# Patient Record
Sex: Female | Born: 1975 | Race: Black or African American | Hispanic: No | Marital: Single | Smoking: Never smoker
Health system: Southern US, Community
[De-identification: ages and names within clinical notes are randomized; demographics above are authoritative.]

## PROBLEM LIST (undated history)

## (undated) DIAGNOSIS — M549 Dorsalgia, unspecified: Secondary | ICD-10-CM

## (undated) DIAGNOSIS — R079 Chest pain, unspecified: Secondary | ICD-10-CM

## (undated) DIAGNOSIS — T7840XA Allergy, unspecified, initial encounter: Secondary | ICD-10-CM

## (undated) DIAGNOSIS — M25471 Effusion, right ankle: Secondary | ICD-10-CM

## (undated) DIAGNOSIS — G4733 Obstructive sleep apnea (adult) (pediatric): Secondary | ICD-10-CM

## (undated) DIAGNOSIS — N920 Excessive and frequent menstruation with regular cycle: Secondary | ICD-10-CM

## (undated) DIAGNOSIS — R600 Localized edema: Secondary | ICD-10-CM

## (undated) DIAGNOSIS — K219 Gastro-esophageal reflux disease without esophagitis: Secondary | ICD-10-CM

## (undated) DIAGNOSIS — M25472 Effusion, left ankle: Secondary | ICD-10-CM

## (undated) DIAGNOSIS — R002 Palpitations: Secondary | ICD-10-CM

## (undated) DIAGNOSIS — E669 Obesity, unspecified: Secondary | ICD-10-CM

## (undated) DIAGNOSIS — F419 Anxiety disorder, unspecified: Secondary | ICD-10-CM

## (undated) DIAGNOSIS — L918 Other hypertrophic disorders of the skin: Secondary | ICD-10-CM

## (undated) DIAGNOSIS — M7989 Other specified soft tissue disorders: Secondary | ICD-10-CM

## (undated) DIAGNOSIS — I10 Essential (primary) hypertension: Secondary | ICD-10-CM

## (undated) DIAGNOSIS — R12 Heartburn: Secondary | ICD-10-CM

## (undated) DIAGNOSIS — B019 Varicella without complication: Secondary | ICD-10-CM

## (undated) DIAGNOSIS — J329 Chronic sinusitis, unspecified: Principal | ICD-10-CM

## (undated) DIAGNOSIS — Z124 Encounter for screening for malignant neoplasm of cervix: Principal | ICD-10-CM

## (undated) DIAGNOSIS — N3281 Overactive bladder: Secondary | ICD-10-CM

## (undated) DIAGNOSIS — N83209 Unspecified ovarian cyst, unspecified side: Secondary | ICD-10-CM

## (undated) DIAGNOSIS — E785 Hyperlipidemia, unspecified: Secondary | ICD-10-CM

## (undated) DIAGNOSIS — R87629 Unspecified abnormal cytological findings in specimens from vagina: Secondary | ICD-10-CM

## (undated) HISTORY — DX: Essential (primary) hypertension: I10

## (undated) HISTORY — DX: Effusion, right ankle: M25.471

## (undated) HISTORY — PX: OTHER SURGICAL HISTORY: SHX169

## (undated) HISTORY — DX: Hyperlipidemia, unspecified: E78.5

## (undated) HISTORY — DX: Chest pain, unspecified: R07.9

## (undated) HISTORY — DX: Obesity, unspecified: E66.9

## (undated) HISTORY — DX: Obstructive sleep apnea (adult) (pediatric): G47.33

## (undated) HISTORY — DX: Anxiety disorder, unspecified: F41.9

## (undated) HISTORY — DX: Localized edema: R60.0

## (undated) HISTORY — DX: Overactive bladder: N32.81

## (undated) HISTORY — DX: Allergy, unspecified, initial encounter: T78.40XA

## (undated) HISTORY — DX: Other hypertrophic disorders of the skin: L91.8

## (undated) HISTORY — DX: Other specified soft tissue disorders: M79.89

## (undated) HISTORY — DX: Encounter for screening for malignant neoplasm of cervix: Z12.4

## (undated) HISTORY — DX: Gastro-esophageal reflux disease without esophagitis: K21.9

## (undated) HISTORY — DX: Varicella without complication: B01.9

## (undated) HISTORY — DX: Excessive and frequent menstruation with regular cycle: N92.0

## (undated) HISTORY — DX: Effusion, left ankle: M25.472

## (undated) HISTORY — DX: Palpitations: R00.2

## (undated) HISTORY — DX: Heartburn: R12

## (undated) HISTORY — PX: HERNIA REPAIR: SHX51

## (undated) HISTORY — DX: Unspecified abnormal cytological findings in specimens from vagina: R87.629

## (undated) HISTORY — DX: Unspecified ovarian cyst, unspecified side: N83.209

## (undated) HISTORY — DX: Dorsalgia, unspecified: M54.9

## (undated) HISTORY — DX: Chronic sinusitis, unspecified: J32.9

---

## 2000-01-06 ENCOUNTER — Emergency Department (HOSPITAL_COMMUNITY): Admission: EM | Admit: 2000-01-06 | Discharge: 2000-01-06 | Payer: Self-pay | Admitting: Emergency Medicine

## 2001-02-17 ENCOUNTER — Encounter: Payer: Self-pay | Admitting: Internal Medicine

## 2001-02-17 ENCOUNTER — Emergency Department (HOSPITAL_COMMUNITY): Admission: EM | Admit: 2001-02-17 | Discharge: 2001-02-17 | Payer: Self-pay | Admitting: Emergency Medicine

## 2003-01-30 ENCOUNTER — Encounter: Payer: Self-pay | Admitting: Internal Medicine

## 2003-01-30 ENCOUNTER — Encounter: Admission: RE | Admit: 2003-01-30 | Discharge: 2003-01-30 | Payer: Self-pay | Admitting: Internal Medicine

## 2003-02-10 ENCOUNTER — Encounter: Payer: Self-pay | Admitting: Internal Medicine

## 2003-02-10 ENCOUNTER — Encounter: Admission: RE | Admit: 2003-02-10 | Discharge: 2003-02-10 | Payer: Self-pay | Admitting: Internal Medicine

## 2003-04-30 ENCOUNTER — Inpatient Hospital Stay (HOSPITAL_COMMUNITY): Admission: AD | Admit: 2003-04-30 | Discharge: 2003-04-30 | Payer: Self-pay | Admitting: Obstetrics and Gynecology

## 2003-08-15 ENCOUNTER — Ambulatory Visit (HOSPITAL_COMMUNITY): Admission: RE | Admit: 2003-08-15 | Discharge: 2003-08-15 | Payer: Self-pay | Admitting: Obstetrics and Gynecology

## 2003-08-15 ENCOUNTER — Encounter: Payer: Self-pay | Admitting: Obstetrics and Gynecology

## 2004-05-12 ENCOUNTER — Ambulatory Visit (HOSPITAL_COMMUNITY): Admission: RE | Admit: 2004-05-12 | Discharge: 2004-05-12 | Payer: Self-pay | Admitting: Obstetrics and Gynecology

## 2004-07-19 ENCOUNTER — Ambulatory Visit (HOSPITAL_COMMUNITY): Admission: RE | Admit: 2004-07-19 | Discharge: 2004-07-19 | Payer: Self-pay | Admitting: Obstetrics and Gynecology

## 2004-12-05 ENCOUNTER — Emergency Department (HOSPITAL_COMMUNITY): Admission: EM | Admit: 2004-12-05 | Discharge: 2004-12-05 | Payer: Self-pay | Admitting: Emergency Medicine

## 2008-12-12 LAB — HM PAP SMEAR

## 2009-01-13 ENCOUNTER — Emergency Department (HOSPITAL_COMMUNITY): Admission: EM | Admit: 2009-01-13 | Discharge: 2009-01-13 | Payer: Self-pay | Admitting: Emergency Medicine

## 2009-08-20 ENCOUNTER — Emergency Department (HOSPITAL_COMMUNITY): Admission: EM | Admit: 2009-08-20 | Discharge: 2009-08-20 | Payer: Self-pay | Admitting: Emergency Medicine

## 2010-06-08 ENCOUNTER — Ambulatory Visit (HOSPITAL_COMMUNITY): Admission: RE | Admit: 2010-06-08 | Discharge: 2010-06-08 | Payer: Self-pay | Admitting: Chiropractic Medicine

## 2011-03-29 LAB — COMPREHENSIVE METABOLIC PANEL
ALT: 20 U/L (ref 0–35)
AST: 34 U/L (ref 0–37)
Albumin: 3.4 g/dL — ABNORMAL LOW (ref 3.5–5.2)
Alkaline Phosphatase: 44 U/L (ref 39–117)
BUN: 7 mg/dL (ref 6–23)
CO2: 27 mEq/L (ref 19–32)
Calcium: 8.6 mg/dL (ref 8.4–10.5)
Chloride: 100 mEq/L (ref 96–112)
Creatinine, Ser: 0.87 mg/dL (ref 0.4–1.2)
GFR calc Af Amer: >60 mL/min (ref >60)
GFR calc non Af Amer: >60 mL/min (ref >60)
Glucose, Bld: 97 mg/dL (ref 70–99)
Potassium: 3.3 mEq/L — ABNORMAL LOW (ref 3.5–5.1)
Sodium: 136 mEq/L (ref 135–145)
Total Bilirubin: 0.6 mg/dL (ref 0.3–1.2)
Total Protein: 6.9 g/dL (ref 6.0–8.3)

## 2011-03-29 LAB — CBC
HCT: 40.2 % (ref 36.0–46.0)
Hemoglobin: 13.6 g/dL (ref 12.0–15.0)
MCHC: 34 g/dL (ref 30.0–36.0)
MCV: 88.7 fL (ref 78.0–100.0)
Platelets: 252 10*3/uL (ref 150–400)
RBC: 4.53 MIL/uL (ref 3.87–5.11)
RDW: 12.8 % (ref 11.5–15.5)
WBC: 4.9 10*3/uL (ref 4.0–10.5)

## 2011-03-29 LAB — POCT CARDIAC MARKERS
CKMB, poc: 1.7 ng/mL (ref 1.0–8.0)
Myoglobin, poc: 110 ng/mL (ref 12–200)
Myoglobin, poc: 126 ng/mL (ref 12–200)
Troponin i, poc: <0.05 ng/mL (ref 0.00–0.09)

## 2011-03-29 LAB — DIFFERENTIAL
Basophils Absolute: 0 10*3/uL (ref 0.0–0.1)
Basophils Relative: 0 % (ref 0–1)
Eosinophils Absolute: 0 10*3/uL (ref 0.0–0.7)
Eosinophils Relative: 1 % (ref 0–5)
Lymphocytes Relative: 19 % (ref 12–46)
Lymphs Abs: 0.9 10*3/uL (ref 0.7–4.0)
Monocytes Absolute: 0.3 10*3/uL (ref 0.1–1.0)
Monocytes Relative: 6 % (ref 3–12)
Neutro Abs: 3.6 10*3/uL (ref 1.7–7.7)
Neutrophils Relative %: 74 % (ref 43–77)

## 2011-11-28 ENCOUNTER — Ambulatory Visit: Payer: Self-pay | Admitting: Family Medicine

## 2011-11-30 ENCOUNTER — Ambulatory Visit: Payer: Self-pay | Admitting: Family Medicine

## 2011-12-09 ENCOUNTER — Ambulatory Visit (INDEPENDENT_AMBULATORY_CARE_PROVIDER_SITE_OTHER): Payer: PRIVATE HEALTH INSURANCE | Admitting: Family Medicine

## 2011-12-09 ENCOUNTER — Encounter: Payer: Self-pay | Admitting: Family Medicine

## 2011-12-09 VITALS — BP 166/117 | HR 86 | Temp 97.6°F | Ht 67.5 in | Wt 357.4 lb

## 2011-12-09 DIAGNOSIS — I10 Essential (primary) hypertension: Secondary | ICD-10-CM | POA: Insufficient documentation

## 2011-12-09 DIAGNOSIS — E669 Obesity, unspecified: Secondary | ICD-10-CM

## 2011-12-09 DIAGNOSIS — R12 Heartburn: Secondary | ICD-10-CM | POA: Insufficient documentation

## 2011-12-09 DIAGNOSIS — Z23 Encounter for immunization: Secondary | ICD-10-CM

## 2011-12-09 DIAGNOSIS — Z0000 Encounter for general adult medical examination without abnormal findings: Secondary | ICD-10-CM

## 2011-12-09 HISTORY — DX: Obesity, unspecified: E66.9

## 2011-12-09 HISTORY — DX: Essential (primary) hypertension: I10

## 2011-12-09 MED ORDER — CHLORTHALIDONE 25 MG PO TABS: 25.0000 mg | ORAL_TABLET | Freq: Every day | ORAL | Status: DC

## 2011-12-09 NOTE — Patient Instructions (Addendum)

## 2011-12-11 DIAGNOSIS — Z0000 Encounter for general adult medical examination without abnormal findings: Secondary | ICD-10-CM | POA: Insufficient documentation

## 2011-12-11 DIAGNOSIS — Z1211 Encounter for screening for malignant neoplasm of colon: Secondary | ICD-10-CM | POA: Insufficient documentation

## 2011-12-11 NOTE — Progress Notes (Signed)
Patient ID: Christina Mendez, female   DOB: 1976-05-01, 35 y.o.   MRN: [id number] Christina Mendez [id number] May 16, 1976 12/11/2011      Progress Note-Follow Up  Subjective  Chief Complaint  Chief Complaint  Patient presents with  . Establish Care    new patient    HPI  Patient is a 35 year old American female who is in today to establish care. Overall she is in good health but has been noted her blood pressure trending up lately. She is to change when she occasionally feels lightheaded and is finding the pressures in the 130s to 150s systolic most of the time. He does note it seemed to start about 4 months ago and had a toothache and took up to 800 mg of ibuprofen a day. She is no longer doing that but the blood pressure remains elevated. She denies headache, chest pain palpitations, shortness of breath, GI or GU complaints. 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   Past Medical History  Diagnosis Date  . Heartburn   . Chicken pox 3rd grade  . Hypertension   . Allergy last couple of years    seasonal  . HTN (hypertension) 12/09/2011  . Obese 12/09/2011    Past Surgical History  Procedure Date  . Cyst removed 35 yr old    benign, abdominal    Family History  Problem Relation Age of Onset  . Fibromyalgia Mother   . Diabetes Mother     type 2  . Hypertension Mother   . Diabetes Father     type 2  . Heart disease Father 81    2 MI  . Hypertension Father   . Heart attack Father 23    X 2  . Stroke Father     mini strokes  . GER disease Father   . Hypertension Brother   . Cancer Maternal Grandfather     stomach  . Hypertension Paternal Grandmother   . Diabetes Paternal Grandmother     type 2  . Heart attack Paternal Grandmother   . Heart disease Paternal Grandmother   . Heart attack Paternal Grandfather   . Heart disease Paternal Grandfather   . Hypertension Paternal Grandfather     History   Social History  . Marital  Status: Single    Spouse Name: N/A    Number of Children: N/A  . Years of Education: N/A   Occupational History  . Not on file.   Social History Main Topics  . Smoking status: Never Smoker   . Smokeless tobacco: Never Used  . Alcohol Use: Yes     OCCASIONALLY  . Drug Use: No  . Sexually Active: Yes -- Female partner(s)   Other Topics Concern  . Not on file   Social History Narrative  . No narrative on file    No current outpatient prescriptions on file prior to visit.    Allergies  Allergen Reactions  . Skelaxin Hypertension    Review of Systems  Review of Systems  Constitutional: Positive for malaise/fatigue. Negative for fever and chills.  HENT: Negative for hearing loss, nosebleeds and congestion.   Eyes: Negative for discharge.  Respiratory: Negative for cough, sputum production, shortness of breath and wheezing.   Cardiovascular: Negative for chest pain, palpitations and leg swelling.  Gastrointestinal: Positive for heartburn. Negative for nausea, vomiting, abdominal pain, diarrhea, constipation and blood in stool.  Genitourinary: Negative for dysuria, urgency, frequency and hematuria.  Musculoskeletal: Negative for myalgias,  back pain and falls.  Skin: Negative for rash.  Neurological: Positive for dizziness. Negative for tremors, sensory change, focal weakness, loss of consciousness, weakness and headaches.       Describes an occasional light headed feeling when her bp is up  Endo/Heme/Allergies: Negative for polydipsia. Does not bruise/bleed easily.  Psychiatric/Behavioral: Negative for depression and suicidal ideas. The patient is not nervous/anxious and does not have insomnia.     Objective  BP 166/117  Pulse 86  Temp(Src) 97.6 F (36.4 C) (Temporal)  Ht 5' 7.5" (1.715 m)  Wt 357 lb 6.4 oz (162.116 kg)  BMI 55.15 kg/m2  SpO2 95%  LMP 11/11/2011  Physical Exam  Physical Exam  Constitutional: She is oriented to person, place, and time and  well-developed, well-nourished, and in no distress. No distress.       obese  HENT:  Head: Normocephalic and atraumatic.  Right Ear: External ear normal.  Left Ear: External ear normal.  Nose: Nose normal.  Mouth/Throat: Oropharynx is clear and moist. No oropharyngeal exudate.  Eyes: Conjunctivae are normal. Pupils are equal, round, and reactive to light. Right eye exhibits no discharge. Left eye exhibits no discharge. No scleral icterus.  Neck: Normal range of motion. Neck supple. No thyromegaly present.  Cardiovascular: Normal rate, regular rhythm, normal heart sounds and intact distal pulses.   No murmur heard. Pulmonary/Chest: Effort normal and breath sounds normal. No respiratory distress. She has no wheezes. She has no rales.  Abdominal: Soft. Bowel sounds are normal. She exhibits no distension and no mass. There is no tenderness.  Musculoskeletal: Normal range of motion. She exhibits no edema and no tenderness.  Lymphadenopathy:    She has no cervical adenopathy.  Neurological: She is alert and oriented to person, place, and time. She has normal reflexes. No cranial nerve deficit. Coordination normal.  Skin: Skin is warm and dry. No rash noted. She is not diaphoretic.  Psychiatric: Mood, memory and affect normal.    No results found for this basename: TSH   Lab Results  Component Value Date   WBC 4.9 01/13/2009   HGB 13.6 01/13/2009   HCT 40.2 01/13/2009   MCV 88.7 01/13/2009   PLT 252 01/13/2009   Lab Results  Component Value Date   CREATININE 0.87 01/13/2009   BUN 7 01/13/2009   NA 136 01/13/2009   K 3.3* 01/13/2009   CL 100 01/13/2009   CO2 27 01/13/2009   Lab Results  Component Value Date   ALT 20 01/13/2009   AST 34 01/13/2009   ALKPHOS 44 01/13/2009   BILITOT 0.6 01/13/2009    Assessment & Plan  Preventative health care Given flu shot today, encouraged lifestyle changes such as walking daily and DASH diet  Obese Given paperwork on the DASH diet and encouragedf to walk daily,  check TSH and other labs prior to next visit.  HTN (hypertension) Patient with elevated bp elswhere as well, she reports it has been trending up for months. Will start Chlorthalidone today and check a renal panel at next visit.  Heartburn Controlled on Omeprazole most of the time, encouraged avoidance of offending foods and small frequent meals, may use Tums prn

## 2011-12-11 NOTE — Assessment & Plan Note (Signed)
Patient with elevated bp elswhere as well, she reports it has been trending up for months. Will start Chlorthalidone today and check a renal panel at next visit.

## 2011-12-11 NOTE — Assessment & Plan Note (Signed)
Controlled on Omeprazole most of the time, encouraged avoidance of offending foods and small frequent meals, may use Tums prn

## 2011-12-11 NOTE — Assessment & Plan Note (Signed)
Given flu shot today, encouraged lifestyle changes such as walking daily and DASH diet

## 2011-12-11 NOTE — Assessment & Plan Note (Signed)
Given paperwork on the DASH diet and encouragedf to walk daily, check TSH and other labs prior to next visit.

## 2011-12-21 ENCOUNTER — Ambulatory Visit (INDEPENDENT_AMBULATORY_CARE_PROVIDER_SITE_OTHER): Payer: PRIVATE HEALTH INSURANCE

## 2011-12-21 DIAGNOSIS — Z23 Encounter for immunization: Secondary | ICD-10-CM

## 2011-12-21 MED ORDER — TUBERCULIN PPD 5 UNIT/0.1ML ID SOLN: 5.0000 [IU] | Freq: Once | INTRADERMAL | Status: DC

## 2011-12-23 ENCOUNTER — Encounter: Payer: Self-pay | Admitting: *Deleted

## 2011-12-23 LAB — TB SKIN TEST
Induration: 0
TB Skin Test: NEGATIVE mm

## 2012-01-06 ENCOUNTER — Ambulatory Visit (INDEPENDENT_AMBULATORY_CARE_PROVIDER_SITE_OTHER): Payer: PRIVATE HEALTH INSURANCE | Admitting: Family Medicine

## 2012-01-06 ENCOUNTER — Encounter: Payer: Self-pay | Admitting: Family Medicine

## 2012-01-06 DIAGNOSIS — I10 Essential (primary) hypertension: Secondary | ICD-10-CM

## 2012-01-06 DIAGNOSIS — J329 Chronic sinusitis, unspecified: Secondary | ICD-10-CM

## 2012-01-06 DIAGNOSIS — Z0000 Encounter for general adult medical examination without abnormal findings: Secondary | ICD-10-CM

## 2012-01-06 DIAGNOSIS — R12 Heartburn: Secondary | ICD-10-CM

## 2012-01-06 LAB — CBC
MCHC: 33.9 g/dL (ref 30.0–36.0)
RBC: 4.46 Mil/uL (ref 3.87–5.11)
WBC: 6.5 10*3/uL (ref 4.5–10.5)

## 2012-01-06 LAB — LIPID PANEL
HDL: 44.6 mg/dL (ref >39.00)
VLDL: 20 mg/dL (ref 0.0–40.0)

## 2012-01-06 LAB — RENAL FUNCTION PANEL
BUN: 14 mg/dL (ref 6–23)
Calcium: 8.8 mg/dL (ref 8.4–10.5)
Chloride: 101 mEq/L (ref 96–112)
GFR: 90.6 mL/min (ref >60.00)
Glucose, Bld: 100 mg/dL — ABNORMAL HIGH (ref 70–99)
Phosphorus: 3.6 mg/dL (ref 2.3–4.6)
Potassium: 3.4 mEq/L — ABNORMAL LOW (ref 3.5–5.1)

## 2012-01-06 LAB — HEPATIC FUNCTION PANEL
Bilirubin, Direct: 0 mg/dL (ref 0.0–0.3)
Total Protein: 7.4 g/dL (ref 6.0–8.3)

## 2012-01-06 MED ORDER — AZITHROMYCIN 250 MG PO TABS: ORAL_TABLET | ORAL | Status: AC

## 2012-01-06 MED ORDER — HYDROCOD POLST-CHLORPHEN POLST 10-8 MG/5ML PO LQCR: 5.0000 mL | Freq: Two times a day (BID) | ORAL | Status: DC | PRN

## 2012-01-06 NOTE — Progress Notes (Signed)
Patient ID: Christina Mendez, female   DOB: Jan 30, 1976, 36 y.o.   MRN: [id number] KEIRSTAN IANNELLO [id number] 27-Aug-1976 01/06/2012      Progress Note-Follow Up  Subjective  Chief Complaint  Chief Complaint  Patient presents with  . Follow-up    1 month follow up  . sinus infection    cough w/ phlegm (green)  . Headache    sinus    HPI  Patient is a 36 year old Caucasian female who is in today in followup. Unfortunately she has been struggling with sinus and URI symptoms for roughly 2 weeks ago. She is facial pressure and headaches. She has nasal congestion productive of green phlegm. She has low-grade fevers and chills. She struggling with fatigue and persistent cough productive of green phlegm. She denies any chest pain or wheezing but does have some shortness of breath with exertion. No myalgias GI or GU complaints noted. She has been taking over-the-counter medications including Alka-Seltzer plus.  Past Medical History  Diagnosis Date  . Heartburn   . Chicken pox 3rd grade  . Hypertension   . Allergy last couple of years    seasonal  . HTN (hypertension) 12/09/2011  . Obese 12/09/2011    Past Surgical History  Procedure Date  . Cyst removed 36 yr old    benign, abdominal    Family History  Problem Relation Age of Onset  . Fibromyalgia Mother   . Diabetes Mother     type 2  . Hypertension Mother   . Diabetes Father     type 2  . Heart disease Father 34    2 MI  . Hypertension Father   . Heart attack Father 23    X 2  . Stroke Father     mini strokes  . GER disease Father   . Hypertension Brother   . Cancer Maternal Grandfather     stomach  . Hypertension Paternal Grandmother   . Diabetes Paternal Grandmother     type 2  . Heart attack Paternal Grandmother   . Heart disease Paternal Grandmother   . Heart attack Paternal Grandfather   . Heart disease Paternal Grandfather   . Hypertension Paternal Grandfather     History   Social History  .  Marital Status: Single    Spouse Name: N/A    Number of Children: N/A  . Years of Education: N/A   Occupational History  . Not on file.   Social History Main Topics  . Smoking status: Never Smoker   . Smokeless tobacco: Never Used  . Alcohol Use: Yes     OCCASIONALLY  . Drug Use: No  . Sexually Active: Yes -- Female partner(s)   Other Topics Concern  . Not on file   Social History Narrative  . No narrative on file    Current Outpatient Prescriptions on File Prior to Visit  Medication Sig Dispense Refill  . chlorthalidone (HYGROTON) 25 MG tablet Take 1 tablet (25 mg total) by mouth daily.  30 tablet  2  . omeprazole (PRILOSEC) 40 MG capsule Take 40 mg by mouth daily.          Allergies  Allergen Reactions  . Skelaxin Hypertension    Review of Systems  Review of Systems  Constitutional: Positive for fever, chills and malaise/fatigue.  HENT: Positive for congestion. Negative for sore throat.        Mild scratchy throat and facial pressure  Eyes: Negative for pain and discharge.  Respiratory: Positive for  cough, sputum production and shortness of breath. Negative for wheezing.   Cardiovascular: Negative for chest pain, palpitations and leg swelling.  Gastrointestinal: Negative for nausea, abdominal pain and diarrhea.  Genitourinary: Negative for dysuria.  Musculoskeletal: Negative for myalgias and falls.  Skin: Negative for rash.  Neurological: Negative for loss of consciousness and headaches.  Endo/Heme/Allergies: Negative for polydipsia.  Psychiatric/Behavioral: Negative for depression and suicidal ideas. The patient is not nervous/anxious and does not have insomnia.     Objective  BP 168/135  Pulse 72  Temp(Src) 98 F (36.7 C) (Temporal)  Ht 5' 7.5" (1.715 m)  Wt 353 lb (160.12 kg)  BMI 54.47 kg/m2  SpO2 95%  LMP 12/17/2011  Physical Exam  Physical Exam  Constitutional: She is oriented to person, place, and time and well-developed, well-nourished, and  in no distress. No distress.  HENT:  Head: Normocephalic and atraumatic.  Eyes: Conjunctivae are normal.  Neck: Neck supple. No thyromegaly present.  Cardiovascular: Normal rate, regular rhythm and normal heart sounds.   No murmur heard. Pulmonary/Chest: Effort normal and breath sounds normal. She has no wheezes.  Abdominal: She exhibits no distension and no mass.  Musculoskeletal: She exhibits no edema.  Lymphadenopathy:    She has no cervical adenopathy.  Neurological: She is alert and oriented to person, place, and time.  Skin: Skin is warm and dry. No rash noted. She is not diaphoretic.  Psychiatric: Memory, affect and judgment normal.    No results found for this basename: TSH   Lab Results  Component Value Date   WBC 4.9 01/13/2009   HGB 13.6 01/13/2009   HCT 40.2 01/13/2009   MCV 88.7 01/13/2009   PLT 252 01/13/2009   Lab Results  Component Value Date   CREATININE 0.87 01/13/2009   BUN 7 01/13/2009   NA 136 01/13/2009   K 3.3* 01/13/2009   CL 100 01/13/2009   CO2 27 01/13/2009   Lab Results  Component Value Date   ALT 20 01/13/2009   AST 34 01/13/2009   ALKPHOS 44 01/13/2009   BILITOT 0.6 01/13/2009      Assessment & Plan   Sinusitis Patient has been struggling with symptoms for roughly 2 weeks ago is given a course of azithromycin. Asked to take Mucinex twice a day and each bedtime next use when necessary her blood pressure is up today she is asked to avoid any medications containing Sudafed or Lipitor for a period  HTN (hypertension) Poorly controlled at first but improved with recheck. She is asked to avoid all products containing Sudafed and Dextromethorphan which she has been taking  Heartburn No c/o. Avoid offending foods and continue Omeprazole

## 2012-01-06 NOTE — Patient Instructions (Addendum)
Sinusitis Sinuses are air pockets within the bones of your face. The growth of bacteria within a sinus leads to infection. The infection prevents the sinuses from draining. This infection is called sinusitis. SYMPTOMS  There will be different areas of pain depending on which sinuses have become infected.  The maxillary sinuses often produce pain beneath the eyes.   Frontal sinusitis may cause pain in the middle of the forehead and above the eyes.  Other problems (symptoms) include:  Toothaches.   Colored, pus-like (purulent) drainage from the nose.   Swelling, warmth, and tenderness over the sinus areas may be signs of infection.  TREATMENT  Sinusitis is most often determined by an exam.X-rays may be taken. If x-rays have been taken, make sure you obtain your results or find out how you are to obtain them. Your caregiver may give you medications (antibiotics). These are medications that will help kill the bacteria causing the infection. You may also be given a medication (decongestant) that helps to reduce sinus swelling.  HOME CARE INSTRUCTIONS   Only take over-the-counter or prescription medicines for pain, discomfort, or fever as directed by your caregiver.   Drink extra fluids. Fluids help thin the mucus so your sinuses can drain more easily.   Applying either moist heat or ice packs to the sinus areas may help relieve discomfort.   Use saline nasal sprays to help moisten your sinuses. The sprays can be found at your local drugstore.  SEEK IMMEDIATE MEDICAL CARE IF:  You have a fever.   You have increasing pain, severe headaches, or toothache.   You have nausea, vomiting, or drowsiness.   You develop unusual swelling around the face or trouble seeing.  MAKE SURE YOU:   Understand these instructions.   Will watch your condition.   Will get help right away if you are not doing well or get worse.  Document Released: 11/28/2005 Document Revised: 08/10/2011 Document Reviewed:  06/27/2007 [facility] Patient Information 2012 [place], [place].    Mucinex twice daily x 10 day Increase po fluids  Avoid D/Sudafed/pseudoephedrine No DM/Dextromethorphan

## 2012-01-08 ENCOUNTER — Encounter: Payer: Self-pay | Admitting: Family Medicine

## 2012-01-08 DIAGNOSIS — J329 Chronic sinusitis, unspecified: Secondary | ICD-10-CM | POA: Insufficient documentation

## 2012-01-08 HISTORY — DX: Chronic sinusitis, unspecified: J32.9

## 2012-01-08 NOTE — Assessment & Plan Note (Signed)
Patient has been struggling with symptoms for roughly 2 weeks ago is given a course of azithromycin. Asked to take Mucinex twice a day and each bedtime next use when necessary her blood pressure is up today she is asked to avoid any medications containing Sudafed or Lipitor for a period

## 2012-01-08 NOTE — Assessment & Plan Note (Signed)
Poorly controlled at first but improved with recheck. She is asked to avoid all products containing Sudafed and Dextromethorphan which she has been taking

## 2012-01-08 NOTE — Assessment & Plan Note (Signed)
No c/o. Avoid offending foods and continue Omeprazole

## 2012-03-02 ENCOUNTER — Ambulatory Visit (INDEPENDENT_AMBULATORY_CARE_PROVIDER_SITE_OTHER): Payer: PRIVATE HEALTH INSURANCE | Admitting: Family Medicine

## 2012-03-02 ENCOUNTER — Other Ambulatory Visit (HOSPITAL_COMMUNITY)
Admission: RE | Admit: 2012-03-02 | Discharge: 2012-03-02 | Disposition: A | Discharge status: Home / Self Care | Payer: PRIVATE HEALTH INSURANCE | Source: Ambulatory Visit | Attending: Family Medicine | Admitting: Family Medicine

## 2012-03-02 ENCOUNTER — Encounter: Payer: Self-pay | Admitting: Family Medicine

## 2012-03-02 VITALS — BP 118/76 | HR 90 | Temp 97.7°F | Ht 67.5 in | Wt 353.0 lb

## 2012-03-02 DIAGNOSIS — E669 Obesity, unspecified: Secondary | ICD-10-CM

## 2012-03-02 DIAGNOSIS — L909 Atrophic disorder of skin, unspecified: Secondary | ICD-10-CM

## 2012-03-02 DIAGNOSIS — T7840XA Allergy, unspecified, initial encounter: Secondary | ICD-10-CM

## 2012-03-02 DIAGNOSIS — I10 Essential (primary) hypertension: Secondary | ICD-10-CM

## 2012-03-02 DIAGNOSIS — E876 Hypokalemia: Secondary | ICD-10-CM

## 2012-03-02 DIAGNOSIS — Z124 Encounter for screening for malignant neoplasm of cervix: Secondary | ICD-10-CM

## 2012-03-02 DIAGNOSIS — J329 Chronic sinusitis, unspecified: Secondary | ICD-10-CM

## 2012-03-02 DIAGNOSIS — L919 Hypertrophic disorder of the skin, unspecified: Secondary | ICD-10-CM

## 2012-03-02 DIAGNOSIS — R12 Heartburn: Secondary | ICD-10-CM

## 2012-03-02 DIAGNOSIS — L918 Other hypertrophic disorders of the skin: Secondary | ICD-10-CM

## 2012-03-02 DIAGNOSIS — Z01419 Encounter for gynecological examination (general) (routine) without abnormal findings: Secondary | ICD-10-CM | POA: Insufficient documentation

## 2012-03-02 HISTORY — DX: Encounter for screening for malignant neoplasm of cervix: Z12.4

## 2012-03-02 HISTORY — DX: Other hypertrophic disorders of the skin: L91.8

## 2012-03-02 HISTORY — DX: Allergy, unspecified, initial encounter: T78.40XA

## 2012-03-02 LAB — RENAL FUNCTION PANEL
Albumin: 4.1 g/dL (ref 3.5–5.2)
BUN: 12 mg/dL (ref 6–23)
CO2: 31 mEq/L (ref 19–32)
Calcium: 9.4 mg/dL (ref 8.4–10.5)
GFR: 95.4 mL/min (ref >60.00)
Phosphorus: 4 mg/dL (ref 2.3–4.6)
Sodium: 139 mEq/L (ref 135–145)

## 2012-03-02 LAB — T4, FREE: Free T4: 1.06 ng/dL (ref 0.60–1.60)

## 2012-03-02 MED ORDER — FLUTICASONE PROPIONATE 50 MCG/ACT NA SUSP: 2.0000 | Freq: Every day | NASAL | Status: DC | PRN

## 2012-03-02 MED ORDER — CETIRIZINE HCL 10 MG PO TABS: 10.0000 mg | ORAL_TABLET | Freq: Every day | ORAL | Status: DC

## 2012-03-02 NOTE — Patient Instructions (Signed)
Allergies, Generic Allergies may happen from anything your body is sensitive to. This may be food, medicines, pollens, chemicals, and nearly anything around you in everyday life that produces allergens. An allergen is anything that causes an allergy producing substance. Heredity is often a factor in causing these problems. This means you may have some of the same allergies as your parents. Food allergies happen in all age groups. Food allergies are some of the most severe and life threatening. Some common food allergies are cow's milk, seafood, eggs, nuts, wheat, and soybeans. SYMPTOMS   Swelling around the mouth.   An itchy red rash or hives.   Vomiting or diarrhea.   Difficulty breathing.  SEVERE ALLERGIC REACTIONS ARE LIFE-THREATENING. This reaction is called anaphylaxis. It can cause the mouth and throat to swell and cause difficulty with breathing and swallowing. In severe reactions only a trace amount of food (for example, peanut oil in a salad) may cause death within seconds. Seasonal allergies occur in all age groups. These are seasonal because they usually occur during the same season every year. They may be a reaction to molds, grass pollens, or tree pollens. Other causes of problems are house dust mite allergens, pet dander, and mold spores. The symptoms often consist of nasal congestion, a runny itchy nose associated with sneezing, and tearing itchy eyes. There is often an associated itching of the mouth and ears. The problems happen when you come in contact with pollens and other allergens. Allergens are the particles in the air that the body reacts to with an allergic reaction. This causes you to release allergic antibodies. Through a chain of events, these eventually cause you to release histamine into the blood stream. Although it is meant to be protective to the body, it is this release that causes your discomfort. This is why you were given anti-histamines to feel better. If you are  unable to pinpoint the offending allergen, it may be determined by skin or blood testing. Allergies cannot be cured but can be controlled with medicine. Hay fever is a collection of all or some of the seasonal allergy problems. It may often be treated with simple over-the-counter medicine such as diphenhydramine. Take medicine as directed. Do not drink alcohol or drive while taking this medicine. Check with your caregiver or package insert for child dosages. If these medicines are not effective, there are many new medicines your caregiver can prescribe. Stronger medicine such as nasal spray, eye drops, and corticosteroids may be used if the first things you try do not work well. Other treatments such as immunotherapy or desensitizing injections can be used if all else fails. Follow up with your caregiver if problems continue. These seasonal allergies are usually not life threatening. They are generally more of a nuisance that can often be handled using medicine. HOME CARE INSTRUCTIONS   If unsure what causes a reaction, keep a diary of foods eaten and symptoms that follow. Avoid foods that cause reactions.   If hives or rash are present:   Take medicine as directed.   You may use an over-the-counter antihistamine (diphenhydramine) for hives and itching as needed.   Apply cold compresses (cloths) to the skin or take baths in cool water. Avoid hot baths or showers. Heat will make a rash and itching worse.   If you are severely allergic:   Following a treatment for a severe reaction, hospitalization is often required for closer follow-up.   Wear a medic-alert bracelet or necklace stating the allergy.     You and your family must learn how to give adrenaline or use an anaphylaxis kit.   If you have had a severe reaction, always carry your anaphylaxis kit or EpiPen with you. Use this medicine as directed by your caregiver if a severe reaction is occurring. Failure to do so could have a fatal  outcome.  SEEK MEDICAL CARE IF:  You suspect a food allergy. Symptoms generally happen within 30 minutes of eating a food.   Your symptoms have not gone away within 2 days or are getting worse.   You develop new symptoms.   You want to retest yourself or your child with a food or drink you think causes an allergic reaction. Never do this if an anaphylactic reaction to that food or drink has happened before. Only do this under the care of a caregiver.  SEEK IMMEDIATE MEDICAL CARE IF:   You have difficulty breathing, are wheezing, or have a tight feeling in your chest or throat.   You have a swollen mouth, or you have hives, swelling, or itching all over your body.   You have had a severe reaction that has responded to your anaphylaxis kit or an EpiPen. These reactions may return when the medicine has worn off. These reactions should be considered life threatening.  MAKE SURE YOU:   Understand these instructions.   Will watch your condition.   Will get help right away if you are not doing well or get worse.  Document Released: 02/21/2003 Document Revised: 11/17/2011 Document Reviewed: 07/28/2008 [facility] Patient Information 2012 [place], [place].  Continue nasal saline and add new meds call if symptoms worsen

## 2012-03-02 NOTE — Assessment & Plan Note (Signed)
Adequately controlled no change in meds 

## 2012-03-02 NOTE — Assessment & Plan Note (Signed)
No c/o today 

## 2012-03-02 NOTE — Assessment & Plan Note (Signed)
No concerns today, pap taken today

## 2012-03-02 NOTE — Progress Notes (Signed)
Patient ID: Christina Mendez, female   DOB: 04/20/1976, 36 y.o.   MRN: [id number] Christina Mendez [id number] [birth year] 03/02/2012      Progress Note New Patient  Subjective  Chief Complaint  Chief Complaint  Patient presents with  . Gynecologic Exam    HPI  Patient is a 36 on an American female who is in today for an exam. Overall she's doing well. He does have a few concerns. She has persistent nasal congestion and postnasal drip. Her headaches and difficulty sleeping due to the level of postnasal drip are noted. She is using nasal saline and Benadryl with only minimal effect. She denies any GYN complaints. She is concerned about multiple irritated skin tags under breasts on her abdomen and on her neck. Her heartburn is well-controlled she's not had any febrile illnesses. She denies any chest pain, palpitations, shortness of breath, GU complaints. She has started to exercise more and try to maintain a heart healthy diet and is somewhat frustrated with her lack of weight loss  Past Medical History  Diagnosis Date  . Heartburn   . Chicken pox 3rd grade  . Hypertension   . Allergy last couple of years    seasonal  . HTN (hypertension) 12/09/2011  . Obese 12/09/2011  . Sinusitis 01/08/2012  . Cervical cancer screening 03/02/2012  . Allergic state 03/02/2012    Past Surgical History  Procedure Date  . Cyst removed 36 yr old    benign, abdominal    Family History  Problem Relation Age of Onset  . Fibromyalgia Mother   . Diabetes Mother     type 2  . Hypertension Mother   . Diabetes Father     type 2  . Heart disease Father 23    2 MI  . Hypertension Father   . Heart attack Father 23    X 2  . Stroke Father     mini strokes  . GER disease Father   . Hyperlipidemia Father   . Hypertension Brother   . Cancer Maternal Grandfather     stomach  . Hypertension Paternal Grandmother   . Diabetes Paternal Grandmother     type 2  . Heart attack Paternal Grandmother   .  Heart disease Paternal Grandmother   . Heart attack Paternal Grandfather   . Heart disease Paternal Grandfather   . Hypertension Paternal Grandfather     History   Social History  . Marital Status: Single    Spouse Name: N/A    Number of Children: N/A  . Years of Education: N/A   Occupational History  . Not on file.   Social History Main Topics  . Smoking status: Never Smoker   . Smokeless tobacco: Never Used  . Alcohol Use: Yes     OCCASIONALLY  . Drug Use: No  . Sexually Active: Yes -- Female partner(s)   Other Topics Concern  . Not on file   Social History Narrative  . No narrative on file    Current Outpatient Prescriptions on File Prior to Visit  Medication Sig Dispense Refill  . chlorpheniramine-HYDROcodone (TUSSIONEX PENNKINETIC ER) 10-8 MG/5ML LQCR Take 5 mLs by mouth every 12 (twelve) hours as needed (cough).  140 mL  1  . chlorthalidone (HYGROTON) 25 MG tablet Take 1 tablet (25 mg total) by mouth daily.  30 tablet  2  . omeprazole (PRILOSEC) 40 MG capsule Take 40 mg by mouth daily.          Allergies  Allergen Reactions  . Skelaxin Hypertension    Review of Systems  Review of Systems  Constitutional: Negative for fever, chills and malaise/fatigue.  HENT: Positive for congestion. Negative for hearing loss and nosebleeds.   Eyes: Negative for discharge.  Respiratory: Positive for sputum production. Negative for cough, shortness of breath and wheezing.   Cardiovascular: Negative for chest pain, palpitations and leg swelling.  Gastrointestinal: Negative for heartburn, nausea, vomiting, abdominal pain, diarrhea, constipation and blood in stool.  Genitourinary: Negative for dysuria, urgency, frequency and hematuria.  Musculoskeletal: Negative for myalgias, back pain and falls.  Skin: Negative for rash.       Irritated skin tags, on neck, under breasts  Neurological: Negative for dizziness, tremors, sensory change, focal weakness, loss of consciousness,  weakness and headaches.  Endo/Heme/Allergies: Negative for polydipsia. Does not bruise/bleed easily.  Psychiatric/Behavioral: Negative for depression and suicidal ideas. The patient is not nervous/anxious and does not have insomnia.     Objective  BP 118/76  Pulse 90  Temp(Src) 97.7 F (36.5 C) (Temporal)  Ht 5' 7.5" (1.715 m)  Wt 353 lb (160.12 kg)  BMI 54.47 kg/m2  SpO2 100%  LMP 02/09/2012  Physical Exam   Physical Exam  Constitutional: She is oriented to person, place, and time and well-developed, well-nourished, and in no distress. No distress.       obese  HENT:  Head: Normocephalic and atraumatic.  Right Ear: External ear normal.  Left Ear: External ear normal.  Nose: Nose normal.  Mouth/Throat: Oropharynx is clear and moist. No oropharyngeal exudate.  Eyes: Conjunctivae are normal. Pupils are equal, round, and reactive to light. Right eye exhibits no discharge. Left eye exhibits no discharge. No scleral icterus.  Neck: Normal range of motion. Neck supple. No thyromegaly present.  Cardiovascular: Normal rate, regular rhythm, normal heart sounds and intact distal pulses.   No murmur heard. Pulmonary/Chest: Effort normal and breath sounds normal. No respiratory distress. She has no wheezes. She has no rales.  Abdominal: Soft. Bowel sounds are normal. She exhibits no distension and no mass. There is no tenderness.  Genitourinary: Vagina normal, uterus normal, cervix normal, right adnexa normal and left adnexa normal. No vaginal discharge found.  Musculoskeletal: Normal range of motion. She exhibits no edema and no tenderness.  Lymphadenopathy:    She has no cervical adenopathy.  Neurological: She is alert and oriented to person, place, and time. She has normal reflexes. No cranial nerve deficit. Coordination normal.  Skin: Skin is warm and dry. No rash noted. She is not diaphoretic.       Skin tags under b/l breasts  Psychiatric: Mood, memory and affect normal.    Assessment & Plan  Cervical cancer screening No concerns today, pap taken today  Sinusitis Acute symptoms improved  HTN (hypertension) Adequately controlled no change in meds  Obese Weight is stable patient is exercising more an eating better she is encourage dto continue her efforts  Heartburn No c/o today  Allergic state Persistent nasal congestion and pnd. Is using Benadryl qhs and nasal saline, is encouraged to add Cetirizine in am and Flonase daily, report worsening symptoms

## 2012-03-02 NOTE — Assessment & Plan Note (Signed)
Weight is stable patient is exercising more an eating better she is encourage dto continue her efforts

## 2012-03-02 NOTE — Assessment & Plan Note (Signed)
Acute symptoms improved

## 2012-03-02 NOTE — Assessment & Plan Note (Signed)
Under breasts on neck etc, frequently catch on clothing and jewelry, is referred to dermatology for possible removal

## 2012-03-02 NOTE — Assessment & Plan Note (Signed)
Persistent nasal congestion and pnd. Is using Benadryl qhs and nasal saline, is encouraged to add Cetirizine in am and Flonase daily, report worsening symptoms

## 2012-03-19 ENCOUNTER — Other Ambulatory Visit: Payer: Self-pay | Admitting: Family Medicine

## 2012-04-30 ENCOUNTER — Other Ambulatory Visit: Payer: Self-pay | Admitting: Family Medicine

## 2012-04-30 DIAGNOSIS — T7840XA Allergy, unspecified, initial encounter: Secondary | ICD-10-CM

## 2012-04-30 MED ORDER — CETIRIZINE HCL 10 MG PO TABS: 10.0000 mg | ORAL_TABLET | Freq: Every day | ORAL | Status: DC

## 2012-04-30 MED ORDER — CHLORTHALIDONE 25 MG PO TABS: 25.0000 mg | ORAL_TABLET | Freq: Every day | ORAL | Status: DC

## 2012-04-30 MED ORDER — FLUTICASONE PROPIONATE 50 MCG/ACT NA SUSP: 2.0000 | Freq: Every day | NASAL | Status: DC | PRN

## 2012-04-30 NOTE — Telephone Encounter (Signed)
RX sent in for 2 month supply.

## 2012-07-04 ENCOUNTER — Ambulatory Visit: Payer: PRIVATE HEALTH INSURANCE | Admitting: Family Medicine

## 2012-08-17 ENCOUNTER — Encounter: Payer: Self-pay | Admitting: Family Medicine

## 2012-08-17 ENCOUNTER — Ambulatory Visit (INDEPENDENT_AMBULATORY_CARE_PROVIDER_SITE_OTHER): Payer: BC Managed Care – PPO | Admitting: Family Medicine

## 2012-08-17 VITALS — BP 139/86 | HR 80 | Temp 97.0°F | Ht 67.5 in | Wt 346.0 lb

## 2012-08-17 DIAGNOSIS — E669 Obesity, unspecified: Secondary | ICD-10-CM

## 2012-08-17 DIAGNOSIS — N83209 Unspecified ovarian cyst, unspecified side: Secondary | ICD-10-CM

## 2012-08-17 DIAGNOSIS — Z0000 Encounter for general adult medical examination without abnormal findings: Secondary | ICD-10-CM

## 2012-08-17 DIAGNOSIS — I10 Essential (primary) hypertension: Secondary | ICD-10-CM

## 2012-08-17 DIAGNOSIS — K219 Gastro-esophageal reflux disease without esophagitis: Secondary | ICD-10-CM

## 2012-08-17 DIAGNOSIS — Z23 Encounter for immunization: Secondary | ICD-10-CM

## 2012-08-17 DIAGNOSIS — T7840XA Allergy, unspecified, initial encounter: Secondary | ICD-10-CM

## 2012-08-17 DIAGNOSIS — R12 Heartburn: Secondary | ICD-10-CM

## 2012-08-17 DIAGNOSIS — N926 Irregular menstruation, unspecified: Secondary | ICD-10-CM

## 2012-08-17 DIAGNOSIS — N920 Excessive and frequent menstruation with regular cycle: Secondary | ICD-10-CM

## 2012-08-17 HISTORY — DX: Unspecified ovarian cyst, unspecified side: N83.209

## 2012-08-17 LAB — RENAL FUNCTION PANEL
BUN: 8 mg/dL (ref 6–23)
CO2: 26 mEq/L (ref 19–32)
Chloride: 103 mEq/L (ref 96–112)
GFR: 131.46 mL/min (ref >60.00)
Phosphorus: 3.6 mg/dL (ref 2.3–4.6)
Potassium: 3.8 mEq/L (ref 3.5–5.1)
Sodium: 138 mEq/L (ref 135–145)

## 2012-08-17 LAB — CBC
Hemoglobin: 14.5 g/dL (ref 12.0–15.0)
Platelets: 273 10*3/uL (ref 150.0–400.0)
RBC: 4.87 Mil/uL (ref 3.87–5.11)
WBC: 5.5 10*3/uL (ref 4.5–10.5)

## 2012-08-17 MED ORDER — RANITIDINE HCL 300 MG PO TABS: 300.0000 mg | ORAL_TABLET | Freq: Every evening | ORAL | Status: DC | PRN

## 2012-08-17 MED ORDER — CETIRIZINE HCL 10 MG PO TABS: 10.0000 mg | ORAL_TABLET | Freq: Every day | ORAL | Status: DC

## 2012-08-17 MED ORDER — FLUTICASONE PROPIONATE 50 MCG/ACT NA SUSP: 2.0000 | Freq: Every day | NASAL | Status: DC | PRN

## 2012-08-17 MED ORDER — CHLORTHALIDONE 25 MG PO TABS: 25.0000 mg | ORAL_TABLET | Freq: Every day | ORAL | Status: DC

## 2012-08-17 NOTE — Assessment & Plan Note (Signed)
Has been using Ranitidine with good results lately. Is given an rx for 300mg  po qhs and avoid offending foods

## 2012-08-17 NOTE — Patient Instructions (Addendum)

## 2012-08-17 NOTE — Assessment & Plan Note (Signed)
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

## 2012-08-17 NOTE — Assessment & Plan Note (Signed)
Encouraged DASH diet and increased exercise 

## 2012-08-17 NOTE — Assessment & Plan Note (Addendum)
Mildly elevated upon arrival, minimize sodium discussed DASH diet and restarted Chlorthalidone which she did not take today

## 2012-08-17 NOTE — Assessment & Plan Note (Signed)
Using Cetirizine 10 mg po daily prn allergies, given refill today

## 2012-08-17 NOTE — Progress Notes (Signed)
Patient ID: Christina Mendez, female   DOB: [birth year], 36 y.o.   MRN: [id number] Christina Mendez [id number] [birth year] 08/17/2012      Progress Note-Follow Up  Subjective  Chief Complaint  Chief Complaint  Patient presents with  . 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 She overall feels well she is complaining of increased menstrual frequency lately. She notes it on she had to cycles. Depression with life in usual pregnancy test was negative. She's also had increased cramping in her back just prior to her periods which resolved. Started. She reports she's had an ovarian cyst diagnosed by her gynecologist. No other recent illness, fevers, chills, chest pain, palpitations, shortness of breath GI or GU complaints. Has not taken his chlorthalidone daily recently and did not take it today.   Past Medical History  Diagnosis Date  . Heartburn   . Chicken pox 3rd grade  . Hypertension   . Allergy last couple of years    seasonal  . HTN (hypertension) 12/09/2011  . Obese 12/09/2011  . Sinusitis 01/08/2012  . Cervical cancer screening 03/02/2012  . Allergic state 03/02/2012  . Inflamed skin tag 03/02/2012  . Ovarian cyst 08/17/2012    Past Surgical History  Procedure Date  . Cyst removed 36 yr old    benign, abdominal    Family History  Problem Relation Age of Onset  . Fibromyalgia Mother   . Diabetes Mother     type 2  . Hypertension Mother   . Diabetes Father     type 2  . Heart disease Father 92    2 MI  . Hypertension Father   . Heart attack Father 23    X 2  . Stroke Father     mini strokes  . GER disease Father   . Hyperlipidemia Father   . Hypertension Brother   . Cancer Maternal Grandfather     stomach  . Hypertension Paternal Grandmother   . Diabetes Paternal Grandmother     type 2    . Heart attack Paternal Grandmother   . Heart disease Paternal Grandmother   . Heart attack Paternal Grandfather   . Heart disease Paternal Grandfather   . Hypertension Paternal Grandfather     History   Social History  . Marital Status: Single    Spouse Name: N/A    Number of Children: N/A  . Years of Education: N/A   Occupational History  . Not on file.   Social History Main Topics  . Smoking status: Never Smoker   . Smokeless tobacco: Never Used  . Alcohol Use: Yes     OCCASIONALLY  . Drug Use: No  . Sexually Active: Yes -- Female partner(s)   Other Topics Concern  . Not on file   Social History Narrative  . No narrative on file    Current Outpatient Prescriptions on File Prior to Visit  Medication Sig Dispense Refill  . DISCONTD: cetirizine (ZYRTEC) 10 MG tablet Take 1 tablet (10 mg total) by mouth daily.  60 tablet  0  . DISCONTD: chlorthalidone (HYGROTON) 25 MG tablet Take 1 tablet (25 mg total) by mouth daily.  60 tablet  0  . DISCONTD: fluticasone (FLONASE) 50 MCG/ACT nasal spray Place 2 sprays into the nose daily as needed  for rhinitis.  32 g  0  . ranitidine (ZANTAC) 300 MG tablet Take 1 tablet (300 mg total) by mouth at bedtime as needed for heartburn.  30 tablet  2    Allergies  Allergen Reactions  . Skelaxin Hypertension    Review of Systems  Review of Systems  Constitutional: Negative for fever and malaise/fatigue.  HENT: Negative for congestion.   Eyes: Negative for discharge.  Respiratory: Negative for shortness of breath.   Cardiovascular: Negative for chest pain, palpitations and leg swelling.  Gastrointestinal: Negative for nausea, abdominal pain and diarrhea.  Genitourinary: Negative for dysuria.  Musculoskeletal: Negative for falls.  Skin: Negative for rash.  Neurological: Negative for loss of consciousness and headaches.  Endo/Heme/Allergies: Negative for polydipsia.  Psychiatric/Behavioral: Negative for depression and suicidal ideas.  The patient is not nervous/anxious and does not have insomnia.     Objective  BP 139/86  Pulse 80  Temp 97 F (36.1 C) (Temporal)  Ht 5' 7.5" (1.715 m)  Wt 346 lb (156.945 kg)  BMI 53.39 kg/m2  SpO2 97%  LMP 08/13/2012  Physical Exam  Physical Exam  Constitutional: She is oriented to person, place, and time and well-developed, well-nourished, and in no distress. No distress.  HENT:  Head: Normocephalic and atraumatic.  Eyes: Conjunctivae are normal.  Neck: Neck supple. No thyromegaly present.  Cardiovascular: Normal rate, regular rhythm and normal heart sounds.   No murmur heard. Pulmonary/Chest: Effort normal and breath sounds normal. She has no wheezes.  Abdominal: She exhibits no distension and no mass.  Musculoskeletal: She exhibits no edema.  Lymphadenopathy:    She has no cervical adenopathy.  Neurological: She is alert and oriented to person, place, and time.  Skin: Skin is warm and dry. No rash noted. She is not diaphoretic.  Psychiatric: Memory, affect and judgment normal.    Lab Results  Component Value Date   TSH 0.58 03/02/2012   Lab Results  Component Value Date   WBC 6.5 01/06/2012   HGB 13.6 01/06/2012   HCT 40.0 01/06/2012   MCV 89.8 01/06/2012   PLT 313.0 01/06/2012   Lab Results  Component Value Date   CREATININE 0.9 03/02/2012   BUN 12 03/02/2012   NA 139 03/02/2012   K 3.3* 03/02/2012   CL 97 03/02/2012   CO2 31 03/02/2012   Lab Results  Component Value Date   ALT 25 01/06/2012   AST 22 01/06/2012   ALKPHOS 77 01/06/2012   BILITOT 0.4 01/06/2012   Lab Results  Component Value Date   CHOL 166 01/06/2012   Lab Results  Component Value Date   HDL 44.60 01/06/2012   Lab Results  Component Value Date   LDLCALC 101* 01/06/2012   Lab Results  Component Value Date   TRIG 100.0 01/06/2012   Lab Results  Component Value Date   CHOLHDL 4 01/06/2012     Assessment & Plan  HTN (hypertension) Mildly elevated upon arrival, minimize sodium  discussed DASH diet and restarted Chlorthalidone which she did not take today  Heartburn Has been using Ranitidine with good results lately. Is given an rx for 300mg  po qhs and avoid offending foods  Allergic state Using Cetirizine 10 mg po daily prn allergies, given refill today  Obese Encouraged DASH diet and increased exercise  Ovarian cyst Now noting an increase in the frequency of her cycles over the past month or so, reports 2 distinct cycles in August despite a negative pregnancy test. 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

## 2012-08-17 NOTE — Assessment & Plan Note (Signed)
Now noting an increase in the frequency of her cycles over the past month or so, reports 2 distinct cycles in August despite a negative pregnancy test. Given her history of a large cyst, will refer her back to her gynecologist Dr Ambrose Mantle for further evaluation

## 2012-11-23 ENCOUNTER — Other Ambulatory Visit: Payer: Self-pay

## 2012-11-23 ENCOUNTER — Other Ambulatory Visit: Payer: Self-pay | Admitting: Family Medicine

## 2012-11-23 DIAGNOSIS — T7840XA Allergy, unspecified, initial encounter: Secondary | ICD-10-CM

## 2012-11-23 MED ORDER — RANITIDINE HCL 300 MG PO TABS: 300.0000 mg | ORAL_TABLET | Freq: Every day | ORAL | Status: DC

## 2012-11-23 MED ORDER — CHLORTHALIDONE 25 MG PO TABS: 25.0000 mg | ORAL_TABLET | Freq: Every day | ORAL | Status: DC

## 2012-11-23 MED ORDER — CETIRIZINE HCL 10 MG PO TABS: 10.0000 mg | ORAL_TABLET | Freq: Every day | ORAL | Status: DC

## 2013-02-01 ENCOUNTER — Telehealth: Payer: Self-pay | Admitting: Family Medicine

## 2013-02-01 MED ORDER — CHLORTHALIDONE 25 MG PO TABS: 25.0000 mg | ORAL_TABLET | Freq: Every day | ORAL | Status: DC

## 2013-02-01 MED ORDER — CETIRIZINE HCL 10 MG PO TABS: 10.0000 mg | ORAL_TABLET | Freq: Every day | ORAL | Status: DC

## 2013-02-01 MED ORDER — RANITIDINE HCL 300 MG PO TABS: 300.0000 mg | ORAL_TABLET | Freq: Every day | ORAL | Status: DC

## 2013-02-04 ENCOUNTER — Ambulatory Visit (INDEPENDENT_AMBULATORY_CARE_PROVIDER_SITE_OTHER): Payer: BC Managed Care – PPO | Admitting: Family

## 2013-02-04 ENCOUNTER — Encounter: Payer: Self-pay | Admitting: Family

## 2013-02-04 VITALS — BP 130/100 | HR 73 | Temp 97.9°F | Resp 18 | Wt 337.1 lb

## 2013-02-04 DIAGNOSIS — J069 Acute upper respiratory infection, unspecified: Secondary | ICD-10-CM | POA: Insufficient documentation

## 2013-02-04 MED ORDER — NEOMYCIN-POLYMYXIN-HC OP SUSP: OPHTHALMIC | Status: DC

## 2013-02-04 NOTE — Assessment & Plan Note (Signed)
Likely viral at this point. Recommend trial of mucinex DM. Add empiric opthalmic abx for conjuctivitis. Call if symptoms worsen or if not improved in 2-3 days.

## 2013-02-04 NOTE — Progress Notes (Signed)
Subjective:    Patient ID: Christina Mendez, female    DOB: March 14, 1976, 37 y.o.   MRN: [id number]  HPI  Ms.  Christina Mendez is a 37 yr old female who presents today with concern re: left eye irritation since last night. Eye is "itching." She rinsed it with cool water which helped some.  Denies problems with vision. She also reports + productive or green/yellow sputum. She has some associated "pressure behind my eyes." Denies associated fever.     She has taken her BP medication today.   Review of Systems See HPI  Past Medical History  Diagnosis Date  . Heartburn   . Chicken pox 3rd grade  . Hypertension   . Allergy last couple of years    seasonal  . HTN (hypertension) 12/09/2011  . Obese 12/09/2011  . Sinusitis 01/08/2012  . Cervical cancer screening 03/02/2012  . Allergic state 03/02/2012  . Inflamed skin tag 03/02/2012  . Ovarian cyst 08/17/2012    History   Social History  . Marital Status: Single    Spouse Name: N/A    Number of Children: N/A  . Years of Education: N/A   Occupational History  . Not on file.   Social History Main Topics  . Smoking status: Never Smoker   . Smokeless tobacco: Never Used  . Alcohol Use: Yes     Comment: OCCASIONALLY  . Drug Use: No  . Sexually Active: Yes -- Female partner(s)   Other Topics Concern  . Not on file   Social History Narrative  . No narrative on file    Past Surgical History  Procedure Laterality Date  . Cyst removed  37 yr old    benign, abdominal    Family History  Problem Relation Age of Onset  . Fibromyalgia Mother   . Diabetes Mother     type 2  . Hypertension Mother   . Diabetes Father     type 2  . Heart disease Father 29    2 MI  . Hypertension Father   . Heart attack Father 23    X 2  . Stroke Father     mini strokes  . GER disease Father   . Hyperlipidemia Father   . Hypertension Brother   . Cancer Maternal Grandfather     stomach  . Hypertension Paternal Grandmother   . Diabetes Paternal  Grandmother     type 2  . Heart attack Paternal Grandmother   . Heart disease Paternal Grandmother   . Heart attack Paternal Grandfather   . Heart disease Paternal Grandfather   . Hypertension Paternal Grandfather     Allergies  Allergen Reactions  . Skelaxin Hypertension    Current Outpatient Prescriptions on File Prior to Visit  Medication Sig Dispense Refill  . cetirizine (ZYRTEC) 10 MG tablet Take 1 tablet (10 mg total) by mouth daily.  30 tablet  1  . chlorthalidone (HYGROTON) 25 MG tablet Take 1 tablet (25 mg total) by mouth daily.  30 tablet  1  . fluticasone (FLONASE) 50 MCG/ACT nasal spray Place 2 sprays into the nose daily as needed for rhinitis or allergies.  16 g  2  . ranitidine (ZANTAC) 300 MG tablet Take 1 tablet (300 mg total) by mouth at bedtime.  30 tablet  1   No current facility-administered medications on file prior to visit.    BP 130/100  Pulse 73  Temp(Src) 97.9 F (36.6 C) (Oral)  Resp 18  Wt 337 lb  1.3 oz (152.898 kg)  BMI 51.98 kg/m2  SpO2 99%       Objective:   Physical Exam  Constitutional: She is oriented to person, place, and time. She appears well-developed and well-nourished. No distress.  HENT:  Head: Normocephalic and atraumatic.  R eye normal, mild injection left eye  Cardiovascular: Normal rate and regular rhythm.   No murmur heard. Pulmonary/Chest: Effort normal and breath sounds normal. No respiratory distress. She has no wheezes. She has no rales. She exhibits no tenderness.  Neurological: She is alert and oriented to person, place, and time.  Psychiatric: She has a normal mood and affect. Her behavior is normal. Judgment and thought content normal.          Assessment & Plan:

## 2013-02-04 NOTE — Patient Instructions (Addendum)
Please call if symptoms worsen or if not improved in 2-3 days.   

## 2013-02-06 NOTE — Telephone Encounter (Signed)
error 

## 2013-03-26 ENCOUNTER — Telehealth: Payer: Self-pay | Admitting: Family Medicine

## 2013-03-26 NOTE — Telephone Encounter (Signed)
Patient Information:  Caller Name: Amyia  Phone: [phone]  Patient: Christina Mendez  Gender: Female  DOB: 1975/12/31  Age: 37 Years  PCP: Danise Edge [facility])  Pregnant: No  Office Follow Up:  Does the office need to follow up with this patient?: No  Instructions For The Office: N/A  RN Note:  Scabies Information reviewed with patient per Health Education. Encouraged to call back for questions, changes or concerns.  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 She is concerned and wanting information.   scabies information reviewed per Health Education.  Reviewed Health History In EMR: Yes  Reviewed Medications In EMR: Yes  Reviewed Allergies In EMR: Yes  Reviewed Surgeries / Procedures: Yes  Date of Onset of Symptoms: 03/25/2013 OB / GYN:  LMP: Unknown  Guideline(s) Used:  No Protocol Available - Information Only  Disposition Per Guideline:   Home Care  Reason For Disposition Reached:   Information only question and nurse able to answer  Advice Given:  Call Back If:  New symptoms develop  You become worse.  Patient Will Follow Care Advice:  YES

## 2013-04-13 ENCOUNTER — Other Ambulatory Visit: Payer: Self-pay | Admitting: Family Medicine

## 2013-04-15 NOTE — Telephone Encounter (Signed)
Rx sent in to pharmacy. 

## 2013-05-30 ENCOUNTER — Other Ambulatory Visit: Payer: Self-pay | Admitting: Family Medicine

## 2013-10-17 ENCOUNTER — Other Ambulatory Visit: Payer: Self-pay

## 2013-10-18 ENCOUNTER — Other Ambulatory Visit: Payer: Self-pay | Admitting: Family Medicine

## 2014-01-14 ENCOUNTER — Ambulatory Visit: Payer: BC Managed Care – PPO | Admitting: Family Medicine

## 2014-02-17 ENCOUNTER — Other Ambulatory Visit: Payer: Self-pay | Admitting: Family Medicine

## 2014-02-18 ENCOUNTER — Encounter: Payer: Self-pay | Admitting: Family Medicine

## 2014-02-18 ENCOUNTER — Ambulatory Visit (INDEPENDENT_AMBULATORY_CARE_PROVIDER_SITE_OTHER): Payer: BC Managed Care – PPO | Admitting: Family Medicine

## 2014-02-18 VITALS — BP 128/88 | HR 96 | Temp 99.8°F | Ht 67.5 in | Wt 322.0 lb

## 2014-02-18 DIAGNOSIS — I10 Essential (primary) hypertension: Secondary | ICD-10-CM

## 2014-02-18 DIAGNOSIS — N912 Amenorrhea, unspecified: Secondary | ICD-10-CM

## 2014-02-18 DIAGNOSIS — J029 Acute pharyngitis, unspecified: Secondary | ICD-10-CM

## 2014-02-18 MED ORDER — PREDNISONE 20 MG PO TABS: 40.0000 mg | ORAL_TABLET | Freq: Every day | ORAL | Status: DC

## 2014-02-18 MED ORDER — CEFDINIR 300 MG PO CAPS: 300.0000 mg | ORAL_CAPSULE | Freq: Two times a day (BID) | ORAL | Status: AC

## 2014-02-18 NOTE — Patient Instructions (Signed)
Probiotic daily, digestive Advantage or a daily Increase hydration   Pharyngitis Pharyngitis is redness, pain, and swelling (inflammation) of your pharynx.  CAUSES  Pharyngitis is usually caused by infection. Most of the time, these infections are from viruses (viral) and are part of a cold. However, sometimes pharyngitis is caused by bacteria (bacterial). Pharyngitis can also be caused by allergies. Viral pharyngitis may be spread from person to person by coughing, sneezing, and personal items or utensils (cups, forks, spoons, toothbrushes). Bacterial pharyngitis may be spread from person to person by more intimate contact, such as kissing.  SIGNS AND SYMPTOMS  Symptoms of pharyngitis include:   Sore throat.   Tiredness (fatigue).   Low-grade fever.   Headache.  Joint pain and muscle aches.  Skin rashes.  Swollen lymph nodes.  Plaque-like film on throat or tonsils (often seen with bacterial pharyngitis). DIAGNOSIS  Your health care provider will ask you questions about your illness and your symptoms. Your medical history, along with a physical exam, is often all that is needed to diagnose pharyngitis. Sometimes, a rapid strep test is done. Other lab tests may also be done, depending on the suspected cause.  TREATMENT  Viral pharyngitis will usually get better in 3 4 days without the use of medicine. Bacterial pharyngitis is treated with medicines that kill germs (antibiotics).  HOME CARE INSTRUCTIONS   Drink enough water and fluids to keep your urine clear or pale yellow.   Only take over-the-counter or prescription medicines as directed by your health care provider:   If you are prescribed antibiotics, make sure you finish them even if you start to feel better.   Do not take aspirin.   Get lots of rest.   Gargle with 8 oz of salt water ( tsp of salt per 1 qt of water) as often as every 1 2 hours to soothe your throat.   Throat lozenges (if you are not at risk  for choking) or sprays may be used to soothe your throat. SEEK MEDICAL CARE IF:   You have large, tender lumps in your neck.  You have a rash.  You cough up green, yellow-brown, or bloody spit. SEEK IMMEDIATE MEDICAL CARE IF:   Your neck becomes stiff.  You drool or are unable to swallow liquids.  You vomit or are unable to keep medicines or liquids down.  You have severe pain that does not go away with the use of recommended medicines.  You have trouble breathing (not caused by a stuffy nose). MAKE SURE YOU:   Understand these instructions.  Will watch your condition.  Will get help right away if you are not doing well or get worse. Document Released: 11/28/2005 Document Revised: 09/18/2013 Document Reviewed: 08/05/2013 [facility]Exit[facility] Patient Information 2014 [place], [place].

## 2014-02-18 NOTE — Progress Notes (Signed)
Patient ID: Christina Mendez, female   DOB: 1976-02-05, 38 y.o.   MRN: [id number] Christina Mendez [id number] [birth year] 02/18/2014      Progress Note-Follow Up  Subjective  Chief Complaint  Chief Complaint  Patient presents with  . Sore Throat    sunday, fever of 102, nasal congestion, fatigue, light HA, congested chest, difficult breathing, wheezing and crackling while lying down.  . Menstrual Problem    missing cycle in feburary.     HPI  Patient is a 38 year old female in today for evaluation of sore throat. Is noting fevers/chills/malaise/fatigue/headache/head and chest congestion. Has also noted some increased DOE. Denies GI concerns/CP/palp. Temp to a max of 102 Past Medical History  Diagnosis Date  . Heartburn   . Chicken pox 3rd grade  . Hypertension   . Allergy last couple of years    seasonal  . HTN (hypertension) 12/09/2011  . Obese 12/09/2011  . Sinusitis 01/08/2012  . Cervical cancer screening 03/02/2012  . Allergic state 03/02/2012  . Inflamed skin tag 03/02/2012  . Ovarian cyst 08/17/2012    Past Surgical History  Procedure Laterality Date  . Cyst removed  38 yr old    benign, abdominal    Family History  Problem Relation Age of Onset  . Fibromyalgia Mother   . Diabetes Mother     type 2  . Hypertension Mother   . Diabetes Father     type 2  . Heart disease Father 59    2 MI  . Hypertension Father   . Heart attack Father 23    X 2  . Stroke Father     mini strokes  . GER disease Father   . Hyperlipidemia Father   . Hypertension Brother   . Cancer Maternal Grandfather     stomach  . Hypertension Paternal Grandmother   . Diabetes Paternal Grandmother     type 2  . Heart attack Paternal Grandmother   . Heart disease Paternal Grandmother   . Heart attack Paternal Grandfather   . Heart disease Paternal Grandfather   . Hypertension Paternal Grandfather     History   Social History  . Marital Status: Single    Spouse Name: N/A   Number of Children: N/A  . Years of Education: N/A   Occupational History  . Not on file.   Social History Main Topics  . Smoking status: Never Smoker   . Smokeless tobacco: Never Used  . Alcohol Use: Yes     Comment: OCCASIONALLY  . Drug Use: No  . Sexual Activity: Yes    Partners: Male   Other Topics Concern  . Not on file   Social History Narrative  . No narrative on file    Current Outpatient Prescriptions on File Prior to Visit  Medication Sig Dispense Refill  . chlorthalidone (HYGROTON) 25 MG tablet Take one tablet by mouth one time daily  30 tablet  4  . dextromethorphan-guaiFENesin (MUCINEX DM) 30-600 MG per 12 hr tablet Take 1 tablet by mouth every 12 (twelve) hours.      . NEOMYCIN-POLYMYXIN-HC, OPHTH, SUSP Apply to both eyes 4 times daily for 1 week.  1 Bottle  0  . ranitidine (ZANTAC) 300 MG tablet Take one tablet by mouth one time daily  30 tablet  0  . TGT ALL DAY ALLERGY RELIEF 10 MG tablet Take one tablet by mouth one time daily  30 tablet  4  . fluticasone (FLONASE) 50 MCG/ACT nasal  spray Place 2 sprays into the nose daily as needed for rhinitis or allergies.  16 g  2   No current facility-administered medications on file prior to visit.    Allergies  Allergen Reactions  . Skelaxin Hypertension    Review of Systems  Review of Systems  Constitutional: Positive for fever, chills and malaise/fatigue.  HENT: Positive for congestion and sore throat.   Eyes: Negative for discharge.  Respiratory: Positive for sputum production. Negative for shortness of breath.   Cardiovascular: Negative for chest pain, palpitations and leg swelling.  Gastrointestinal: Negative for nausea, abdominal pain and diarrhea.  Genitourinary: Negative for dysuria.  Musculoskeletal: Negative for falls.  Skin: Negative for rash.  Neurological: Negative for loss of consciousness and headaches.  Endo/Heme/Allergies: Negative for polydipsia.  Psychiatric/Behavioral: Negative for  depression and suicidal ideas. The patient is not nervous/anxious and does not have insomnia.     Objective  BP 128/88  Pulse 96  Temp(Src) 99.8 F (37.7 C) (Oral)  Ht 5' 7.5" (1.715 m)  Wt 322 lb 0.6 oz (146.076 kg)  BMI 49.67 kg/m2  SpO2 99%  LMP 01/01/2014  Physical Exam  Physical Exam  Constitutional: She is oriented to person, place, and time and well-developed, well-nourished, and in no distress. No distress.  HENT:  Head: Normocephalic and atraumatic.  Eyes: Conjunctivae are normal.  Neck: Neck supple. No thyromegaly present.  Cardiovascular: Normal rate, regular rhythm and normal heart sounds.   No murmur heard. Pulmonary/Chest: Effort normal and breath sounds normal. She has no wheezes.  Abdominal: She exhibits no distension and no mass.  Musculoskeletal: She exhibits no edema.  Lymphadenopathy:    She has no cervical adenopathy.  Neurological: She is alert and oriented to person, place, and time.  Skin: Skin is warm and dry. No rash noted. She is not diaphoretic.  Psychiatric: Memory, affect and judgment normal.    Lab Results  Component Value Date   TSH 0.58 03/02/2012   Lab Results  Component Value Date   WBC 5.5 08/17/2012   HGB 14.5 08/17/2012   HCT 43.3 08/17/2012   MCV 89.0 08/17/2012   PLT 273.0 08/17/2012   Lab Results  Component Value Date   CREATININE 0.7 08/17/2012   BUN 8 08/17/2012   NA 138 08/17/2012   K 3.8 08/17/2012   CL 103 08/17/2012   CO2 26 08/17/2012   Lab Results  Component Value Date   ALT 25 01/06/2012   AST 22 01/06/2012   ALKPHOS 77 01/06/2012   BILITOT 0.4 01/06/2012   Lab Results  Component Value Date   CHOL 166 01/06/2012   Lab Results  Component Value Date   HDL 44.60 01/06/2012   Lab Results  Component Value Date   LDLCALC 101* 01/06/2012   Lab Results  Component Value Date   TRIG 100.0 01/06/2012   Lab Results  Component Value Date   CHOLHDL 4 01/06/2012     Assessment & Plan  HTN (hypertension) Well controlled, no  changes to meds. Encouraged heart healthy diet such as the DASH diet and exercise as tolerated.   Amenorrhea Negative pregnancy test  Acute pharyngitis Cefdinir bid, Encouraged increased rest and hydration, add probiotics, zinc. Treat fevers as needed

## 2014-02-18 NOTE — Progress Notes (Signed)
Pre visit review using our clinic review tool, if applicable. No additional management support is needed unless otherwise documented below in the visit note. 

## 2014-02-23 ENCOUNTER — Encounter: Payer: Self-pay | Admitting: Family Medicine

## 2014-02-23 DIAGNOSIS — J029 Acute pharyngitis, unspecified: Secondary | ICD-10-CM | POA: Insufficient documentation

## 2014-02-23 DIAGNOSIS — N912 Amenorrhea, unspecified: Secondary | ICD-10-CM | POA: Insufficient documentation

## 2014-02-23 NOTE — Assessment & Plan Note (Signed)
Well controlled, no changes to meds. Encouraged heart healthy diet such as the DASH diet and exercise as tolerated.  °

## 2014-02-23 NOTE — Assessment & Plan Note (Signed)
Negative pregnancy test

## 2014-02-23 NOTE — Assessment & Plan Note (Signed)
Cefdinir bid, Encouraged increased rest and hydration, add probiotics, zinc. Treat fevers as needed

## 2014-03-04 ENCOUNTER — Telehealth: Payer: Self-pay | Admitting: Family Medicine

## 2014-03-04 DIAGNOSIS — N912 Amenorrhea, unspecified: Secondary | ICD-10-CM

## 2014-03-04 NOTE — Telephone Encounter (Signed)
Pregnancy test ordered and detailed message left on vm

## 2014-03-04 NOTE — Telephone Encounter (Signed)
OK to order a serum pregnancy for amenorrhea

## 2014-03-04 NOTE — Telephone Encounter (Signed)
Missed menstrual cycle in Feb and March. Did take pregnancy test at home, come back negative. Requesting test her to confirmed a true negative or positive, blood test?

## 2014-03-05 NOTE — Telephone Encounter (Signed)
[facility] of [facility]. Cancelled current order and [facility].

## 2014-03-05 NOTE — Addendum Note (Signed)
Addended by: Mervin KungFERGERSON, Ambera Fedele A on: 03/05/2014 04:40 PM   Modules accepted: Orders

## 2014-03-06 ENCOUNTER — Telehealth: Payer: Self-pay | Admitting: Family Medicine

## 2014-03-06 LAB — HCG, SERUM, QUALITATIVE: PREG SERUM: NEGATIVE

## 2014-03-06 NOTE — Telephone Encounter (Signed)
Called and spoke with patient to let her know that blood HCG was negative. She verbalized understanding. JG//CMA

## 2014-03-06 NOTE — Telephone Encounter (Signed)
Wants test results  If she does not answer leave a message

## 2014-03-31 ENCOUNTER — Encounter: Payer: Self-pay | Admitting: Family Medicine

## 2014-03-31 ENCOUNTER — Ambulatory Visit (INDEPENDENT_AMBULATORY_CARE_PROVIDER_SITE_OTHER): Payer: BC Managed Care – PPO | Admitting: Family Medicine

## 2014-03-31 ENCOUNTER — Other Ambulatory Visit (HOSPITAL_COMMUNITY)
Admission: RE | Admit: 2014-03-31 | Discharge: 2014-03-31 | Disposition: A | Discharge status: Home / Self Care | Payer: BC Managed Care – PPO | Source: Ambulatory Visit | Attending: Family Medicine | Admitting: Family Medicine

## 2014-03-31 VITALS — BP 128/100 | HR 75 | Temp 98.6°F | Ht 67.5 in | Wt 332.1 lb

## 2014-03-31 DIAGNOSIS — R5383 Other fatigue: Secondary | ICD-10-CM

## 2014-03-31 DIAGNOSIS — T7840XA Allergy, unspecified, initial encounter: Secondary | ICD-10-CM

## 2014-03-31 DIAGNOSIS — Z0000 Encounter for general adult medical examination without abnormal findings: Secondary | ICD-10-CM

## 2014-03-31 DIAGNOSIS — Z01419 Encounter for gynecological examination (general) (routine) without abnormal findings: Secondary | ICD-10-CM | POA: Insufficient documentation

## 2014-03-31 DIAGNOSIS — R12 Heartburn: Secondary | ICD-10-CM

## 2014-03-31 DIAGNOSIS — I10 Essential (primary) hypertension: Secondary | ICD-10-CM

## 2014-03-31 DIAGNOSIS — E669 Obesity, unspecified: Secondary | ICD-10-CM

## 2014-03-31 DIAGNOSIS — Z124 Encounter for screening for malignant neoplasm of cervix: Secondary | ICD-10-CM

## 2014-03-31 DIAGNOSIS — Z23 Encounter for immunization: Secondary | ICD-10-CM

## 2014-03-31 DIAGNOSIS — R5381 Other malaise: Secondary | ICD-10-CM

## 2014-03-31 DIAGNOSIS — G4700 Insomnia, unspecified: Secondary | ICD-10-CM

## 2014-03-31 DIAGNOSIS — R3915 Urgency of urination: Secondary | ICD-10-CM

## 2014-03-31 DIAGNOSIS — R51 Headache: Secondary | ICD-10-CM

## 2014-03-31 LAB — HEPATIC FUNCTION PANEL
ALK PHOS: 44 U/L (ref 39–117)
ALT: 16 U/L (ref 0–35)
AST: 17 U/L (ref 0–37)
Albumin: 3.9 g/dL (ref 3.5–5.2)
Bilirubin, Direct: 0.1 mg/dL (ref 0.0–0.3)
Indirect Bilirubin: 0.4 mg/dL (ref 0.2–1.2)
TOTAL PROTEIN: 6.9 g/dL (ref 6.0–8.3)
Total Bilirubin: 0.5 mg/dL (ref 0.2–1.2)

## 2014-03-31 LAB — LIPID PANEL
Cholesterol: 171 mg/dL (ref 0–200)
HDL: 51 mg/dL (ref >39)
LDL CALC: 102 mg/dL — AB (ref 0–99)
Total CHOL/HDL Ratio: 3.4 Ratio
Triglycerides: 88 mg/dL (ref <150)
VLDL: 18 mg/dL (ref 0–40)

## 2014-03-31 LAB — CBC
HCT: 38.9 % (ref 36.0–46.0)
Hemoglobin: 13.9 g/dL (ref 12.0–15.0)
MCH: 30 pg (ref 26.0–34.0)
MCHC: 35.7 g/dL (ref 30.0–36.0)
MCV: 83.8 fL (ref 78.0–100.0)
Platelets: 341 10*3/uL (ref 150–400)
RBC: 4.64 MIL/uL (ref 3.87–5.11)
RDW: 14.5 % (ref 11.5–15.5)
WBC: 7 10*3/uL (ref 4.0–10.5)

## 2014-03-31 LAB — RENAL FUNCTION PANEL
Albumin: 3.9 g/dL (ref 3.5–5.2)
BUN: 8 mg/dL (ref 6–23)
CHLORIDE: 98 meq/L (ref 96–112)
CO2: 29 meq/L (ref 19–32)
Calcium: 8.6 mg/dL (ref 8.4–10.5)
Creat: 0.76 mg/dL (ref 0.50–1.10)
Glucose, Bld: 84 mg/dL (ref 70–99)
PHOSPHORUS: 3 mg/dL (ref 2.3–4.6)
POTASSIUM: 3.4 meq/L — AB (ref 3.5–5.3)
Sodium: 134 mEq/L — ABNORMAL LOW (ref 135–145)

## 2014-03-31 LAB — TSH: TSH: 0.828 u[IU]/mL (ref 0.350–4.500)

## 2014-03-31 MED ORDER — RANITIDINE HCL 300 MG PO TABS
300.0000 mg | ORAL_TABLET | Freq: Every day | ORAL | Status: DC
Start: 2014-03-31 — End: 2015-02-05

## 2014-03-31 NOTE — Patient Instructions (Signed)
Start a probiotic such [employer] Move  DASH Diet The DASH diet stands for "Dietary Approaches to Stop Hypertension." It is a healthy eating plan that has been shown to reduce high blood pressure (hypertension) in as little as 14 days, while also possibly providing other significant health benefits. These other health benefits include reducing the risk of breast cancer after menopause and reducing the risk of type 2 diabetes, heart disease, colon cancer, and stroke. Health benefits also include weight loss and slowing kidney failure in patients with chronic kidney disease.  DIET GUIDELINES  Limit salt (sodium). Your diet should contain less than 1500 mg of sodium daily.  Limit refined or processed carbohydrates. Your diet should include mostly whole grains. Desserts and added sugars should be used sparingly.  Include small amounts of heart-healthy fats. These types of fats include nuts, oils, and tub margarine. Limit saturated and trans fats. These fats have been shown to be harmful in the body. CHOOSING FOODS  The following food groups are based on a 2000 calorie diet. See your Registered Dietitian for individual calorie needs. Grains and Grain Products (6 to 8 servings daily)  Eat More Often: Whole-wheat bread, brown rice, whole-grain or wheat pasta, quinoa, popcorn without added fat or salt (air popped).  Eat Less Often: White bread, white pasta, white rice, cornbread. Vegetables (4 to 5 servings daily)  Eat More Often: Fresh, frozen, and canned vegetables. Vegetables may be raw, steamed, roasted, or grilled with a minimal amount of fat.  Eat Less Often/Avoid: Creamed or fried vegetables. Vegetables in a cheese sauce. Fruit (4 to 5 servings daily)  Eat More Often: All fresh, canned (in natural juice), or frozen fruits. Dried fruits without added sugar. One hundred percent fruit juice ( cup [237 mL] daily).  Eat Less Often: Dried fruits with added sugar. Canned fruit in light  or heavy syrup. Foot LockerLean Meats, Fish, and Poultry (2 servings or less daily. One serving is 3 to 4 oz [85-114 g]).  Eat More Often: Ninety percent or leaner ground beef, tenderloin, sirloin. Round cuts of beef, chicken breast, [place]key breast. All fish. Grill, bake, or broil your meat. Nothing should be fried.  Eat Less Often/Avoid: Fatty cuts of meat, [place], or chicken leg, thigh, or wing. Fried cuts of meat or fish. Dairy (2 to 3 servings)  Eat More Often: Low-fat or fat-free milk, low-fat plain or light yogurt, reduced-fat or part-skim cheese.  Eat Less Often/Avoid: Milk (whole, 2%).Whole milk yogurt. Full-fat cheeses. Nuts, Seeds, and Legumes (4 to 5 servings per week)  Eat More Often: All without added salt.  Eat Less Often/Avoid: Salted nuts and seeds, canned beans with added salt. Fats and Sweets (limited)  Eat More Often: Vegetable oils, tub margarines without trans fats, sugar-free gelatin. Mayonnaise and salad dressings.  Eat Less Often/Avoid: Coconut oils, palm oils, butter, stick margarine, cream, half and half, cookies, candy, pie. FOR MORE INFORMATION The Dash Diet Eating Plan: www.dashdiet.org Document Released: 11/17/2011 Document Revised: 02/20/2012 Document Reviewed: 11/17/2011 [facility] Patient Information 2014 [place].

## 2014-03-31 NOTE — Assessment & Plan Note (Addendum)
Pap today, no concerns on exam.  

## 2014-03-31 NOTE — Progress Notes (Signed)
Pre visit review using our clinic review tool, if applicable. No additional management support is needed unless otherwise documented below in the visit note. 

## 2014-03-31 NOTE — Progress Notes (Signed)
Patient ID: Christina Mendez, female   DOB: [birth year], 38 y.o.   MRN: [id number] Christina Mendez [id number] [birth year] 03/31/2014      Progress Note-Follow Up  Subjective  Chief Complaint  Chief Complaint  Patient presents with  . Annual Exam    physical  . Injections    td  . Gynecologic Exam    pap    HPI  Patient is a 38 year old female in today for routine medical care. Is i. Denies CP/palp/SOB/HA/congestion/fevers/GI or GU c/o. Taking meds as prescribedn today for annual exam and complaining of fatigue and malaise. Has restless sleep and trouble sleeping. Snores and wakes up with headaches at times. She otherwise notes a recent sore throat and some congestion although these are improving. No ear pain, throat pain or congestion at this time. No breast or GYN concerns  Past Medical History  Diagnosis Date  . Heartburn   . Chicken pox 3rd grade  . Hypertension   . Allergy last couple of years    seasonal  . HTN (hypertension) 12/09/2011  . Obese 12/09/2011  . Sinusitis 01/08/2012  . Cervical cancer screening 03/02/2012  . Allergic state 03/02/2012  . Inflamed skin tag 03/02/2012  . Ovarian cyst 08/17/2012    Past Surgical History  Procedure Laterality Date  . Cyst removed  38 yr old    benign, abdominal    Family History  Problem Relation Age of Onset  . Fibromyalgia Mother   . Diabetes Mother     type 2  . Hypertension Mother   . Diabetes Father     type 2  . Heart disease Father [id number]    2 MI  . Hypertension Father   . Heart attack Father 23    X 2  . Stroke Father     mini strokes  . GER disease Father   . Hyperlipidemia Father   . Cholelithiasis Father   . Venous thrombosis Father     in liver  . Hypertension Brother   . Venous thrombosis Brother     on spine  . Cancer Maternal Grandfather     stomach  . Hypertension Paternal Grandmother   . Diabetes Paternal Grandmother     type 2  . Heart attack Paternal Grandmother   . Heart disease  Paternal Grandmother   . Heart attack Paternal Grandfather   . Heart disease Paternal Grandfather   . Hypertension Paternal Grandfather   . Cancer Maternal Uncle     prostate cancer    History   Social History  . Marital Status: Single    Spouse Name: N/A    Number of Children: N/A  . Years of Education: N/A   Occupational History  . Not on file.   Social History Main Topics  . Smoking status: Never Smoker   . Smokeless tobacco: Never Used  . Alcohol Use: Yes     Comment: OCCASIONALLY  . Drug Use: No  . Sexual Activity: Yes    Partners: Male     Comment: No dietary restrictions, lives with Mom, Dad and brother and niece.  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 Not on file   Social History Narrative  . No narrative on file    Current Outpatient Prescriptions on File Prior to Visit  Medication Sig Dispense Refill  . chlorthalidone (HYGROTON) 25 MG tablet Take one tablet by mouth one time daily  30 tablet  4  . fluticasone (FLONASE) 50  MCG/ACT nasal spray Place 2 sprays into the nose daily as needed for rhinitis or allergies.  16 g  2  . TGT ALL DAY ALLERGY RELIEF 10 MG tablet Take one tablet by mouth one time daily  30 tablet  4   No current facility-administered medications on file prior to visit.    Allergies  Allergen Reactions  . Skelaxin Hypertension    Review of Systems  Review of Systems  Constitutional: Positive for malaise/fatigue. Negative for fever and chills.  HENT: Positive for congestion and sore throat. Negative for hearing loss and nosebleeds.   Eyes: Negative for discharge.  Respiratory: Negative for cough, sputum production, shortness of breath and wheezing.   Cardiovascular: Negative for chest pain, palpitations and leg swelling.  Gastrointestinal: Negative for heartburn, nausea, vomiting, abdominal pain, diarrhea, constipation and blood in stool.  Genitourinary: Negative for dysuria, urgency, frequency and hematuria.   Musculoskeletal: Negative for back pain, falls and myalgias.  Skin: Negative for rash.  Neurological: Positive for headaches. Negative for dizziness, tremors, sensory change, focal weakness, loss of consciousness and weakness.  Endo/Heme/Allergies: Negative for polydipsia. Does not bruise/bleed easily.  Psychiatric/Behavioral: Negative for depression and suicidal ideas. The patient has insomnia. The patient is not nervous/anxious.     Objective  BP 128/100  Pulse 75  Temp(Src) 98.6 F (37 C) (Oral)  Ht 5' 7.5" (1.715 m)  Wt 332 lb 1.9 oz (150.649 kg)  BMI 51.22 kg/m2  SpO2 96%  LMP 03/11/2014  Physical Exam  Physical Exam  Constitutional: She is oriented to person, place, and time and well-developed, well-nourished, and in no distress. No distress.  HENT:  Head: Normocephalic and atraumatic.  Right Ear: External ear normal.  Left Ear: External ear normal.  Nose: Nose normal.  Mouth/Throat: Oropharynx is clear and moist. No oropharyngeal exudate.  Eyes: Conjunctivae are normal. Pupils are equal, round, and reactive to light. Right eye exhibits no discharge. Left eye exhibits no discharge. No scleral icterus.  Neck: Normal range of motion. Neck supple. No thyromegaly present.  Cardiovascular: Normal rate, regular rhythm, normal heart sounds and intact distal pulses.   No murmur heard. Pulmonary/Chest: Effort normal and breath sounds normal. No respiratory distress. She has no wheezes. She has no rales.  Abdominal: Soft. Bowel sounds are normal. She exhibits no distension and no mass. There is no tenderness.  Genitourinary: Vagina normal, uterus normal, cervix normal, right adnexa normal and left adnexa normal. No vaginal discharge found.  Large breasts, no lesions, masses, or skin changes  Musculoskeletal: Normal range of motion. She exhibits no edema and no tenderness.  Lymphadenopathy:    She has no cervical adenopathy.  Neurological: She is alert and oriented to person,  place, and time. She has normal reflexes. No cranial nerve deficit. Coordination normal.  Skin: Skin is warm and dry. No rash noted. She is not diaphoretic.  Psychiatric: Mood, memory and affect normal.    Lab Results  Component Value Date   TSH 0.58 03/02/2012   Lab Results  Component Value Date   WBC 5.5 08/17/2012   HGB 14.5 08/17/2012   HCT 43.3 08/17/2012   MCV 89.0 08/17/2012   PLT 273.0 08/17/2012   Lab Results  Component Value Date   CREATININE 0.7 08/17/2012   BUN 8 08/17/2012   NA 138 08/17/2012   K 3.8 08/17/2012   CL 103 08/17/2012   CO2 26 08/17/2012   Lab Results  Component Value Date   ALT 25 01/06/2012   AST 22  01/06/2012   ALKPHOS 77 01/06/2012   BILITOT 0.4 01/06/2012   Lab Results  Component Value Date   CHOL 166 01/06/2012   Lab Results  Component Value Date   HDL 44.60 01/06/2012   Lab Results  Component Value Date   LDLCALC 101* 01/06/2012   Lab Results  Component Value Date   TRIG 100.0 01/06/2012   Lab Results  Component Value Date   CHOLHDL 4 01/06/2012     Assessment & Plan   Cervical cancer screening Pap today, no concerns on exam  HTN (hypertension) Borderline, no changes to meds. Encouraged heart healthy diet such as the DASH diet and exercise as tolerated.   Obese Encouraged DASH diet, decrease po intake and increase exercise as tolerated. Needs 7-8 hours of sleep nightly. Avoid trans fats, eat small, frequent meals every 4-5 hours with lean proteins, complex carbs and healthy fats. Minimize simple carbs, GMO foods.  Heartburn Avoid offending foods, start probiotics. Do not eat large meals in late evening and consider raising head of bed. Add zantac  Preventative health care Patient encouraged to maintain heart healthy diet, regular exercise, adequate sleep. Consider daily probiotics. Take medications as prescribed  Allergic state Adequate control  Insomnia C/o insomnia, headache, weight gain. willl set up sleep study

## 2014-04-01 ENCOUNTER — Telehealth: Payer: Self-pay | Admitting: Family Medicine

## 2014-04-01 LAB — URINALYSIS
Bilirubin Urine: NEGATIVE
Glucose, UA: NEGATIVE mg/dL
HGB URINE DIPSTICK: NEGATIVE
Ketones, ur: NEGATIVE mg/dL
Leukocytes, UA: NEGATIVE
NITRITE: NEGATIVE
Protein, ur: NEGATIVE mg/dL
SPECIFIC GRAVITY, URINE: 1.024 (ref 1.005–1.030)
UROBILINOGEN UA: 0.2 mg/dL (ref 0.0–1.0)
pH: 7 (ref 5.0–8.0)

## 2014-04-01 LAB — URINE CULTURE
Colony Count: NO GROWTH
Organism ID, Bacteria: NO GROWTH

## 2014-04-01 NOTE — Telephone Encounter (Signed)
Relevant patient education assigned to patient using Emmi. ° °

## 2014-04-02 ENCOUNTER — Encounter: Payer: Self-pay | Admitting: *Deleted

## 2014-04-02 DIAGNOSIS — G4700 Insomnia, unspecified: Secondary | ICD-10-CM | POA: Insufficient documentation

## 2014-04-02 LAB — TB SKIN TEST
INDURATION: 0 mm
TB SKIN TEST: NEGATIVE

## 2014-04-02 NOTE — Assessment & Plan Note (Signed)
Adequate control. 

## 2014-04-02 NOTE — Assessment & Plan Note (Signed)
C/o insomnia, headache, weight gain. willl set up sleep study

## 2014-04-02 NOTE — Assessment & Plan Note (Signed)
Patient encouraged to maintain heart healthy diet, regular exercise, adequate sleep. Consider daily probiotics. Take medications as prescribed 

## 2014-04-02 NOTE — Assessment & Plan Note (Signed)
Borderline, no changes to meds. Encouraged heart healthy diet such as the DASH diet and exercise as tolerated.

## 2014-04-02 NOTE — Assessment & Plan Note (Signed)
Encouraged DASH diet, decrease po intake and increase exercise as tolerated. Needs 7-8 hours of sleep nightly. Avoid trans fats, eat small, frequent meals every 4-5 hours with lean proteins, complex carbs and healthy fats. Minimize simple carbs, GMO foods. 

## 2014-04-02 NOTE — Assessment & Plan Note (Signed)
Avoid offending foods, start probiotics. Do not eat large meals in late evening and consider raising head of bed. Add zantac

## 2014-05-11 ENCOUNTER — Ambulatory Visit (HOSPITAL_BASED_OUTPATIENT_CLINIC_OR_DEPARTMENT_OTHER): Payer: BC Managed Care – PPO | Attending: Family Medicine

## 2014-05-11 VITALS — Ht 70.0 in | Wt 334.0 lb

## 2014-05-11 DIAGNOSIS — R0989 Other specified symptoms and signs involving the circulatory and respiratory systems: Secondary | ICD-10-CM | POA: Insufficient documentation

## 2014-05-11 DIAGNOSIS — G4733 Obstructive sleep apnea (adult) (pediatric): Secondary | ICD-10-CM | POA: Insufficient documentation

## 2014-05-11 DIAGNOSIS — I10 Essential (primary) hypertension: Secondary | ICD-10-CM

## 2014-05-11 DIAGNOSIS — G4700 Insomnia, unspecified: Secondary | ICD-10-CM

## 2014-05-11 DIAGNOSIS — R5381 Other malaise: Secondary | ICD-10-CM

## 2014-05-11 DIAGNOSIS — R5383 Other fatigue: Secondary | ICD-10-CM

## 2014-05-11 DIAGNOSIS — R0609 Other forms of dyspnea: Secondary | ICD-10-CM | POA: Insufficient documentation

## 2014-05-11 DIAGNOSIS — R404 Transient alteration of awareness: Secondary | ICD-10-CM | POA: Insufficient documentation

## 2014-05-11 DIAGNOSIS — E669 Obesity, unspecified: Secondary | ICD-10-CM | POA: Insufficient documentation

## 2014-05-11 DIAGNOSIS — R51 Headache: Secondary | ICD-10-CM

## 2014-05-11 DIAGNOSIS — Z6841 Body Mass Index (BMI) 40.0 and over, adult: Secondary | ICD-10-CM | POA: Insufficient documentation

## 2014-05-19 DIAGNOSIS — G4730 Sleep apnea, unspecified: Secondary | ICD-10-CM

## 2014-05-19 DIAGNOSIS — G4710 Hypersomnia, unspecified: Secondary | ICD-10-CM

## 2014-05-19 NOTE — Sleep Study (Signed)
[facility]  NAME: Christina Mendez  DATE OF BIRTH: January 14, 1976  MEDICAL RECORD NUMBER [id number]  LOCATION: [facility]  PHYSICIAN: ALVA,RAKESH V.  DATE OF STUDY: 05/11/14  SLEEP STUDY TYPE: Split Polysomnogram               REFERRING PHYSICIAN: Oretha Milch, MD  INDICATION FOR STUDY: 38 year old obese woman with fatigue, excessive daytime somnolence and heavy snoring. At the time of this study ,they weighed 334 pounds with a height of  5 ft 10 inches and the BMI of 48, neck size of 16 inches. Epworth sleepiness score was 7   This intervention polysomnogram was performed with a sleep technologist in attendance. EEG, EOG,EMG and respiratory parameters recorded. Sleep stages, arousals, limb movements and respiratory data was scored according to criteria laid out by the American Academy of sleep medicine.  SLEEP ARCHITECTURE: Lights out was at 2213 PM and lights on was at 506 AM. During the baseline portion ,total sleep time was 123 minutes with sleep latency of 19 minutes and wake after sleep onset of 23 minutes. Sleep efficiency was poor at 74 with frequent long periods of awakening. Sleep stages as a percentage of total sleep time was N1 -5%,N2- 95% and REM sleep 0% ( 0 minutes) . During titration REM sleep accounted for 110 minutes and supine sleep was noted .The longest period of REM sleep was around 4 AM.   AROUSAL DATA : At baseline ,there were 10 arousals with an arousal index of 4.9 events per hour. Of these 4 were spontaneous, and 6 were associated with respiratory events and 0 were associated periodic limb movements. During titration, the arousal index was 0.5  events per hour  RESPIRATORY DATA: During the baseline portion,there were 100 obstructive apneas, 0 central apneas, 0 mixed apneas and 64 hypopneas with apnea -hypopnea index of 80 events per hour.  There was no relation to sleep stage or body position. Due to this degree of  respiratory disturbance CPAP was initiated at 4 cm and titrated to find a level of 13 cm due to persistent events during supine sleep. At the level of 12 cm for 178 minutes of sleep including her 91 minutes of REM sleep, 0 apneas and 0 hypopneas were noted. Titration was optimal .  MOVEMENT/PARASOMNIA: There were 0 PLMS with a PLM index of 0 events per hour. The PLM arousal index was 0 events per hour.   OXYGEN DATA: The lowest desaturation was 77% during non-REM sleep and the desaturation index was 79 per hour. This was corrected by titration and the saturations stayed below 88% for 5 minutes during titration.  CARDIAC DATA: The low heart rate was 31 beats per minute. The high heart rate recorded was an artifact. No arrhythmias were noted   IMPRESSION :  1. severe obstructive sleep apnea with hypopneas causing sleep fragmentation and moderate oxygen desaturation. 2. This was corrected by CPAP of 12 centimeters with large full face mask. Titration was optimal 3. No evidence of cardiac arrhythmias or behavioral disturbance during sleep. Periodic limb movements were not noted.  RECOMMENDATION:    1. The treatment options for this degree of sleep disordered breathing includes weight loss and CPAP therapy. 2. CPAP can be initiated at 12 centimeters with a large full face mask and compliance monitored at this level. 3. Patient should be cautioned against driving when sleepy.They should be asked to avoid medications with sedative side effects  Oretha Milch MD Diplomate,  American Board of Sleep Medicine  ELECTRONICALLY SIGNED ON:  05/19/2014  [facility] PH: [phone]   FX: [phone] ACCREDITED BY THE AMERICAN ACADEMY OF SLEEP MEDICINE

## 2014-05-20 ENCOUNTER — Telehealth: Payer: Self-pay

## 2014-05-20 NOTE — Telephone Encounter (Signed)
Message copied by Court Joy on Tue May 20, 2014  7:10 AM ------      Message from: Danise Edge A      Created: Mon May 19, 2014 11:30 PM       Notify patient she has severe sleep apnea. We will set up CPAP and refer to pulmonology as soon as she is aware and agrees. Forward note to me once she is aware       ----- Message -----         From: Oretha Milch, MD         Sent: 05/19/2014   5:26 PM           To: Bradd Canary, MD            Severe OSA      Needs CPAP 12 cm, lg FF mask set up       ------

## 2014-05-20 NOTE — Telephone Encounter (Signed)
Message copied by Hermann Dottavio L on Tue May 20, 2014  7:10 AM ------      Message from: BLYTH, STACEY A      Created: Mon May 19, 2014 11:30 PM       Notify patient she has severe sleep apnea. We will set up CPAP and refer to pulmonology as soon as she is aware and agrees. Forward note to me once she is aware       ----- Message -----         From: Rakesh Alva V, MD         Sent: 05/19/2014   5:26 PM           To: Stacey A Blyth, MD            Severe OSA      Needs CPAP 12 cm, lg FF mask set up       ------ 

## 2014-05-21 ENCOUNTER — Other Ambulatory Visit: Payer: Self-pay | Admitting: Family Medicine

## 2014-05-21 DIAGNOSIS — G4733 Obstructive sleep apnea (adult) (pediatric): Secondary | ICD-10-CM

## 2014-06-04 ENCOUNTER — Telehealth: Payer: Self-pay | Admitting: Family Medicine

## 2014-06-04 NOTE — Telephone Encounter (Signed)
Barbara CowerJason with [facility] left message stating the patient called to cancel Cpap because she can not pay the copay at this time and she refused to complete patient assistance paperwork. Call NarcissaJason at [phone] ext [phone] with questions

## 2014-06-04 NOTE — Telephone Encounter (Signed)
Please reach out to patient and reaffirm how important the CPAP is to prevent stroke, heart attack, respiratory failure, dementia and even death. She is welcome to come in and discuss if she wants to.

## 2014-06-06 NOTE — Telephone Encounter (Signed)
FYI: Patient informed and states she just cancelled because she didn't have a job. However she states she just got 2 job offers in 24 hours and as soon as she gets her first paycheck she will get the CPAP

## 2014-06-17 ENCOUNTER — Institutional Professional Consult (permissible substitution): Payer: BC Managed Care – PPO | Admitting: Pulmonary Disease

## 2014-07-03 ENCOUNTER — Encounter: Payer: Self-pay | Admitting: Pulmonary Disease

## 2014-07-03 ENCOUNTER — Ambulatory Visit (INDEPENDENT_AMBULATORY_CARE_PROVIDER_SITE_OTHER): Payer: BC Managed Care – PPO | Admitting: Pulmonary Disease

## 2014-07-03 VITALS — BP 134/78 | HR 83 | Ht 68.0 in | Wt 334.0 lb

## 2014-07-03 DIAGNOSIS — G4733 Obstructive sleep apnea (adult) (pediatric): Secondary | ICD-10-CM | POA: Insufficient documentation

## 2014-07-03 DIAGNOSIS — G4730 Sleep apnea, unspecified: Secondary | ICD-10-CM | POA: Insufficient documentation

## 2014-07-03 NOTE — Assessment & Plan Note (Signed)
The pathophysiology of obstructive sleep apnea , it's cardiovascular consequences & modes of treatment including CPAP were discused with the patient in detail & they evidenced understanding.  Weight loss encouraged, compliance with goal of at least 4-6 hrs every night is the expectation. Advised against medications with sedative side effects Cautioned against driving when sleepy - understanding that sleepiness will vary on a day to day basis  Hope to benefit nocturia, morning headaches & fatigue

## 2014-07-03 NOTE — Progress Notes (Signed)
Subjective:    Patient ID: Christina LookJennifer L Lueth, female    DOB: 06/13/1976, 38 y.o.   MRN: [id number]  HPI  38 year old obese woman with fatigue, excessive daytime somnolence and heavy snoring.  She occasionally wakes herself up with gasping episodes in her sleep  At the time of her PSG ,she weighed 334 pounds with a height of 5 ft 10 inches and the BMI of 48, neck size of 16 inches. Epworth sleepiness score was 7 During the baseline portion,there were 100 obstructive apneas, 0 central apneas, 0 mixed apneas and 64 hypopneas with apnea -hypopnea index of 80 events per hour. There was no relation to sleep stage or body position. Due to this degree of respiratory disturbance CPAP was initiated at 4 cm and titrated to find a level of 13 cm due to persistent events during supine sleep. At the level of 12 cm for 178 minutes of sleep including her 91 minutes of REM sleep, 0 apneas and 0 hypopneas were noted. Titration was optimal . The lowest desaturation was 77% during non-REM sleep   Bedtime is around 10 PM to midnight, sleep latency but 15 minutes, she sleeps on her side with 2 pillows, reports 3 bathroom visits and is out of bed by 6:30 AM feeling tired with dryness of mouth and occasional headaches. She's lost 20 pounds in the last 2 years on a weight loss program. She works as a Manufacturing systems engineerpreschool teacher.  There is no history suggestive of cataplexy, sleep paralysis or parasomnias   Past Medical History  Diagnosis Date  . Heartburn   . Chicken pox 3rd grade  . Hypertension   . Allergy last couple of years    seasonal  . HTN (hypertension) 12/09/2011  . Obese 12/09/2011  . Sinusitis 01/08/2012  . Cervical cancer screening 03/02/2012  . Allergic state 03/02/2012  . Inflamed skin tag 03/02/2012  . Ovarian cyst 08/17/2012   Past Surgical History  Procedure Laterality Date  . Cyst removed  38 yr old    benign, abdominal   Allergies  Allergen Reactions  . Skelaxin Hypertension    History    Social History  . Marital Status: Single    Spouse Name: N/A    Number of Children: N/A  . Years of Education: N/A   Occupational History  . Not on file.   Social History Main Topics  . Smoking status: Never Smoker   . Smokeless tobacco: Never Used  . Alcohol Use: Yes     Comment: OCCASIONALLY  . Drug Use: No  . Sexual Activity: Yes    Partners: Male     Comment: No dietary restrictions, lives with Mom, Dad and brother and niece.  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 Not on file   Social History Narrative  . No narrative on file   Family History  Problem Relation Age of Onset  . Fibromyalgia Mother   . Diabetes Mother     type 2  . Hypertension Mother   . Diabetes Father     type 2  . Heart disease Father 1023    2 MI  . Hypertension Father   . Heart attack Father 23    X 2  . Stroke Father     mini strokes  . GER disease Father   . Hyperlipidemia Father   . Cholelithiasis Father   . Venous thrombosis Father     in liver  . Hypertension Brother   . Venous  thrombosis Brother     on spine  . Cancer Maternal Grandfather     stomach  . Hypertension Paternal Grandmother   . Diabetes Paternal Grandmother     type 2  . Heart attack Paternal Grandmother   . Heart disease Paternal Grandmother   . Heart attack Paternal Grandfather   . Heart disease Paternal Grandfather   . Hypertension Paternal Grandfather   . Cancer Maternal Uncle     prostate cancer        Review of Systems  Constitutional: Positive for fatigue. Negative for fever and unexpected weight change.  HENT: Positive for postnasal drip. Negative for congestion, dental problem, ear pain, nosebleeds, rhinorrhea, sinus pressure, sneezing, sore throat and trouble swallowing.   Eyes: Negative for redness and itching.  Respiratory: Negative for cough, chest tightness, shortness of breath and wheezing.   Cardiovascular: Negative for palpitations and leg swelling.  Gastrointestinal:  Negative for nausea and vomiting.  Genitourinary: Negative for dysuria.  Musculoskeletal: Negative for joint swelling.  Skin: Negative for rash.  Neurological: Negative for headaches.  Hematological: Does not bruise/bleed easily.  Psychiatric/Behavioral: Negative for dysphoric mood. The patient is not nervous/anxious.        Objective:   Physical Exam  Gen. Pleasant, obese, in no distress, normal affect ENT - no lesions, no post nasal drip, class 2-3 airway Neck: No JVD, no thyromegaly, no carotid bruits Lungs: no use of accessory muscles, no dullness to percussion, decreased without rales or rhonchi  Cardiovascular: Rhythm regular, heart sounds  normal, no murmurs or gallops, no peripheral edema Abdomen: soft and non-tender, no hepatosplenomegaly, BS normal. Musculoskeletal: No deformities, no cyanosis or clubbing Neuro:  alert, non focal, no tremors        Assessment & Plan:

## 2014-07-03 NOTE — Patient Instructions (Signed)
We will get you started on a CPAP machine Usage of 4-6 h every night is expected

## 2014-07-07 ENCOUNTER — Ambulatory Visit: Payer: BC Managed Care – PPO | Admitting: Family Medicine

## 2014-08-13 ENCOUNTER — Telehealth: Payer: Self-pay | Admitting: Family Medicine

## 2014-08-13 NOTE — Telephone Encounter (Signed)
Caller name: Korrina  Relation to pt: self  Call back number: [phone]   Reason for call:   pt requesting RX for Mammo and colposcopy pt will be turning 39 soon and would like to take care of this, pt has an upcoming appointment 9/10 would like to p/u at the time of visit.

## 2014-08-14 NOTE — Telephone Encounter (Signed)
Left message on home # to return my call. Need clarification of pt's request below and does pt want to wait and discuss requested orders at her upcoming appt on 08/21/14?

## 2014-08-19 NOTE — Telephone Encounter (Signed)
So her best bet is to check with her insurance and tell them I recommend a baseline screening MGM. Then she can ask them if they will pay if they say yes she should get the date, time and name of person she spoke with. She does not need actual referral for screening recommend. Recommend [facility]

## 2014-08-19 NOTE — Telephone Encounter (Signed)
Pt called back stating her insurance will re-new in 01/2015 and she has currently met her deductible.  She will turn 39 in January and wanted to know if she could get a screening mammogram scheduled before her insurance renews. Advised pt that I am not sure if insurance will cover screening mammogram before age 39 and may have to wait until she is 40 for screening. Pt denies current problems or concerns regarding her breasts. Advised pt that screening colonoscopy is not recommended before age 49. Pt has a maternal uncle with colon cancer. Currently denies diarrhea, constipation, abdominal pain or rectal bleeding.  Please advise.

## 2014-08-20 NOTE — Telephone Encounter (Signed)
Patient informed and voiced understanding

## 2014-08-21 ENCOUNTER — Encounter: Payer: Self-pay | Admitting: Family Medicine

## 2014-08-21 ENCOUNTER — Ambulatory Visit (INDEPENDENT_AMBULATORY_CARE_PROVIDER_SITE_OTHER): Payer: BC Managed Care – PPO | Admitting: Pulmonary Disease

## 2014-08-21 ENCOUNTER — Ambulatory Visit (INDEPENDENT_AMBULATORY_CARE_PROVIDER_SITE_OTHER): Payer: BC Managed Care – PPO | Admitting: Family Medicine

## 2014-08-21 ENCOUNTER — Encounter: Payer: Self-pay | Admitting: Pulmonary Disease

## 2014-08-21 VITALS — BP 138/84 | HR 96 | Temp 97.7°F | Ht 67.5 in | Wt 337.0 lb

## 2014-08-21 VITALS — BP 132/80 | HR 62 | Temp 98.1°F | Ht 67.5 in | Wt 337.0 lb

## 2014-08-21 DIAGNOSIS — Z23 Encounter for immunization: Secondary | ICD-10-CM

## 2014-08-21 DIAGNOSIS — T7840XD Allergy, unspecified, subsequent encounter: Secondary | ICD-10-CM

## 2014-08-21 DIAGNOSIS — R32 Unspecified urinary incontinence: Secondary | ICD-10-CM

## 2014-08-21 DIAGNOSIS — I10 Essential (primary) hypertension: Secondary | ICD-10-CM

## 2014-08-21 DIAGNOSIS — G4733 Obstructive sleep apnea (adult) (pediatric): Secondary | ICD-10-CM

## 2014-08-21 DIAGNOSIS — G4700 Insomnia, unspecified: Secondary | ICD-10-CM

## 2014-08-21 DIAGNOSIS — Z5189 Encounter for other specified aftercare: Secondary | ICD-10-CM

## 2014-08-21 DIAGNOSIS — E785 Hyperlipidemia, unspecified: Secondary | ICD-10-CM

## 2014-08-21 DIAGNOSIS — E669 Obesity, unspecified: Secondary | ICD-10-CM

## 2014-08-21 DIAGNOSIS — R12 Heartburn: Secondary | ICD-10-CM

## 2014-08-21 DIAGNOSIS — E871 Hypo-osmolality and hyponatremia: Secondary | ICD-10-CM

## 2014-08-21 DIAGNOSIS — R319 Hematuria, unspecified: Secondary | ICD-10-CM

## 2014-08-21 DIAGNOSIS — E876 Hypokalemia: Secondary | ICD-10-CM

## 2014-08-21 LAB — RENAL FUNCTION PANEL
ALBUMIN: 3.5 g/dL (ref 3.5–5.2)
BUN: 10 mg/dL (ref 6–23)
CO2: 32 meq/L (ref 19–32)
CREATININE: 0.8 mg/dL (ref 0.4–1.2)
Calcium: 8.9 mg/dL (ref 8.4–10.5)
Chloride: 99 mEq/L (ref 96–112)
GFR: 102.36 mL/min (ref >60.00)
Glucose, Bld: 89 mg/dL (ref 70–99)
Phosphorus: 3.1 mg/dL (ref 2.3–4.6)
Potassium: 2.9 mEq/L — ABNORMAL LOW (ref 3.5–5.1)
Sodium: 136 mEq/L (ref 135–145)

## 2014-08-21 LAB — LIPID PANEL
Cholesterol: 170 mg/dL (ref 0–200)
HDL: 40.5 mg/dL (ref >39.00)
LDL Cholesterol: 98 mg/dL (ref 0–99)
NONHDL: 129.5
Total CHOL/HDL Ratio: 4
Triglycerides: 159 mg/dL — ABNORMAL HIGH (ref 0.0–149.0)
VLDL: 31.8 mg/dL (ref 0.0–40.0)

## 2014-08-21 LAB — URINALYSIS, ROUTINE W REFLEX MICROSCOPIC
Bilirubin Urine: NEGATIVE
KETONES UR: NEGATIVE
Leukocytes, UA: NEGATIVE
Nitrite: NEGATIVE
Specific Gravity, Urine: 1.015 (ref 1.000–1.030)
TOTAL PROTEIN, URINE-UPE24: NEGATIVE
URINE GLUCOSE: NEGATIVE
UROBILINOGEN UA: 0.2 (ref 0.0–1.0)
pH: 8 (ref 5.0–8.0)

## 2014-08-21 LAB — CBC
HEMATOCRIT: 40.5 % (ref 36.0–46.0)
HEMOGLOBIN: 13.6 g/dL (ref 12.0–15.0)
MCHC: 33.7 g/dL (ref 30.0–36.0)
MCV: 88.7 fl (ref 78.0–100.0)
Platelets: 284 10*3/uL (ref 150.0–400.0)
RBC: 4.56 Mil/uL (ref 3.87–5.11)
RDW: 13.4 % (ref 11.5–15.5)
WBC: 5.3 10*3/uL (ref 4.0–10.5)

## 2014-08-21 LAB — TSH: TSH: 0.23 u[IU]/mL — AB (ref 0.35–4.50)

## 2014-08-21 MED ORDER — METOPROLOL SUCCINATE ER 25 MG PO TB24: 25.0000 mg | ORAL_TABLET | Freq: Every day | ORAL | Status: DC

## 2014-08-21 MED ORDER — MONTELUKAST SODIUM 10 MG PO TABS: 10.0000 mg | ORAL_TABLET | Freq: Every day | ORAL | Status: DC

## 2014-08-21 NOTE — Progress Notes (Deleted)
   Subjective:    Patient ID: Christina Mendez, female    DOB: September 02, 1976, 38 y.o.   MRN: [id number]  HPI    Review of Systems     Objective:   Physical Exam        Assessment & Plan:

## 2014-08-21 NOTE — Assessment & Plan Note (Signed)
Ct CPAP 12 cm  Weight loss encouraged, compliance with goal of at least 4-6 hrs every night is the expectation. Advised against medications with sedative side effects Cautioned against driving when sleepy - understanding that sleepiness will vary on a day to day basis  

## 2014-08-21 NOTE — Progress Notes (Signed)
   Subjective:    Patient ID: Christina Mendez, female    DOB: Apr 11, 1976, 38 y.o.   MRN: [id number]  HPI  38 year old obese woman for FU of severe OSA PSG - 334 lbs - AHI was 80 events per hour. The lowest desaturation was 77%  CPAP was titrated to find a level of 13 cm Titration was optimal . during non-REM sleep  Chief Complaint  Patient presents with  . Sleep Apnea    follow-up. pt has been using cpap everynight for about 7 hours a night.  Pt states her mask whistles at time but she is able to make adjustments and it will stop.    1 month Download on 12 cm 08/2014 - no residuals, good usage, no leak She has adjusted well, feels more energy, resting better, no nocturia Adjusted humidity for dryness      Review of Systems neg for any significant sore throat, dysphagia, itching, sneezing, nasal congestion or excess/ purulent secretions, fever, chills, sweats, unintended wt loss, pleuritic or exertional cp, hempoptysis, orthopnea pnd or change in chronic leg swelling. Also denies presyncope, palpitations, heartburn, abdominal pain, nausea, vomiting, diarrhea or change in bowel or urinary habits, dysuria,hematuria, rash, arthralgias, visual complaints, headache, numbness weakness or ataxia.     Objective:   Physical Exam  Gen. Pleasant, obese, in no distress ENT - no lesions, no post nasal drip Neck: No JVD, no thyromegaly, no carotid bruits Lungs: no use of accessory muscles, no dullness to percussion, decreased without rales or rhonchi  Cardiovascular: Rhythm regular, heart sounds  normal, no murmurs or gallops, no peripheral edema Musculoskeletal: No deformities, no cyanosis or clubbing , no tremors        Assessment & Plan:

## 2014-08-21 NOTE — Progress Notes (Signed)
Pre visit review using our clinic review tool, if applicable. No additional management support is needed unless otherwise documented below in the visit note. 

## 2014-08-21 NOTE — Patient Instructions (Signed)
Weight loss encouraged, compliance with goal of at least 6 hrs every night is the expectation. Cautioned against driving when sleepy - understanding that sleepiness will vary on a day to day basis  

## 2014-08-21 NOTE — Patient Instructions (Addendum)
DASH Eating Plan DASH stands for "Dietary Approaches to Stop Hypertension." The DASH eating plan is a healthy eating plan that has been shown to reduce high blood pressure (hypertension). Additional health benefits may include reducing the risk of type 2 diabetes mellitus, heart disease, and stroke. The DASH eating plan may also help with weight loss. WHAT DO I NEED TO KNOW ABOUT THE DASH EATING PLAN? For the DASH eating plan, you will follow these general guidelines:  Choose foods with a percent daily value for sodium of less than 5% (as listed on the food label).  Use salt-free seasonings or herbs instead of table salt or sea salt.  Check with your health care provider or pharmacist before using salt substitutes.  Eat lower-sodium products, often labeled as "lower sodium" or "no salt added."  Eat fresh foods.  Eat more vegetables, fruits, and low-fat dairy products.  Choose whole grains. Look for the word "whole" as the first word in the ingredient list.  Choose fish and skinless chicken or turkey more often than red meat. Limit fish, poultry, and meat to 6 oz (170 g) each day.  Limit sweets, desserts, sugars, and sugary drinks.  Choose heart-healthy fats.  Limit cheese to 1 oz (28 g) per day.  Eat more home-cooked food and less restaurant, buffet, and fast food.  Limit fried foods.  Cook foods using methods other than frying.  Limit canned vegetables. If you do use them, rinse them well to decrease the sodium.  When eating at a restaurant, ask that your food be prepared with less salt, or no salt if possible. WHAT FOODS CAN I EAT? Seek help from a dietitian for individual calorie needs. Grains Whole grain or whole wheat bread. Brown rice. Whole grain or whole wheat pasta. Quinoa, bulgur, and whole grain cereals. Low-sodium cereals. Corn or whole wheat flour tortillas. Whole grain cornbread. Whole grain crackers. Low-sodium crackers. Vegetables Fresh or frozen vegetables  (raw, steamed, roasted, or grilled). Low-sodium or reduced-sodium tomato and vegetable juices. Low-sodium or reduced-sodium tomato sauce and paste. Low-sodium or reduced-sodium canned vegetables.  Fruits All fresh, canned (in natural juice), or frozen fruits. Meat and Other Protein Products Ground beef (85% or leaner), grass-fed beef, or beef trimmed of fat. Skinless chicken or turkey. Ground chicken or turkey. Pork trimmed of fat. All fish and seafood. Eggs. Dried beans, peas, or lentils. Unsalted nuts and seeds. Unsalted canned beans. Dairy Low-fat dairy products, such as skim or 1% milk, 2% or reduced-fat cheeses, low-fat ricotta or cottage cheese, or plain low-fat yogurt. Low-sodium or reduced-sodium cheeses. Fats and Oils Tub margarines without trans fats. Light or reduced-fat mayonnaise and salad dressings (reduced sodium). Avocado. Safflower, olive, or canola oils. Natural peanut or almond butter. Other Unsalted popcorn and pretzels. The items listed above may not be a complete list of recommended foods or beverages. Contact your dietitian for more options. WHAT FOODS ARE NOT RECOMMENDED? Grains White bread. White pasta. White rice. Refined cornbread. Bagels and croissants. Crackers that contain trans fat. Vegetables Creamed or fried vegetables. Vegetables in a cheese sauce. Regular canned vegetables. Regular canned tomato sauce and paste. Regular tomato and vegetable juices. Fruits Dried fruits. Canned fruit in light or heavy syrup. Fruit juice. Meat and Other Protein Products Fatty cuts of meat. Ribs, chicken wings, bacon, sausage, bologna, salami, chitterlings, fatback, hot dogs, bratwurst, and packaged luncheon meats. Salted nuts and seeds. Canned beans with salt. Dairy Whole or 2% milk, cream, half-and-half, and cream cheese. Whole-fat or sweetened yogurt. Full-fat   cheeses or blue cheese. Nondairy creamers and whipped toppings. Processed cheese, cheese spreads, or cheese  curds. Condiments Onion and garlic salt, seasoned salt, table salt, and sea salt. Canned and packaged gravies. Worcestershire sauce. Tartar sauce. Barbecue sauce. Teriyaki sauce. Soy sauce, including reduced sodium. Steak sauce. Fish sauce. Oyster sauce. Cocktail sauce. Horseradish. Ketchup and mustard. Meat flavorings and tenderizers. Bouillon cubes. Hot sauce. Tabasco sauce. Marinades. Taco seasonings. Relishes. Fats and Oils Butter, stick margarine, lard, shortening, ghee, and bacon fat. Coconut, palm kernel, or palm oils. Regular salad dressings. Other Pickles and olives. Salted popcorn and pretzels. The items listed above may not be a complete list of foods and beverages to avoid. Contact your dietitian for more information. WHERE CAN I FIND MORE INFORMATION? National Heart, Lung, and Blood Institute: [employer] Document Released: 11/17/2011 Document Revised: 04/14/2014 Document Reviewed: 10/02/2013 [facility] Patient Information 2015 [place], [place]. This information is not intended to replace advice given to you by your health care provider. Make sure you discuss any questions you have with your health care provider. Alopecia Areata Alopecia areata is a self-destructing (autoimmune) disease that results in the loss of hair. In this condition your body's immune system attacks the hair follicle. The hair follicle is responsible for growing hair. Hair loss can occur on the scalp and other parts of the body. It usually starts as one or more small, round, smooth patches of hair loss. It occurs in males and females of all ages and races, but usually starts before age 87. The scalp is the most commonly affected area, but the beard or any hair-bearing site can be affected. This type of hair loss does not leave scars where the hair was lost.  Many people with alopecia areata only have a few patches of hair loss. In others, extensive patchy hair loss occurs. In a few  people, all scalp hair is lost (alopecia totalis), or hair is lost from the entire scalp and body (alopecia universalis). No matter how widespread the hair loss, the hair follicles remain alive and are ready to resume normal hair production whenever they receive the correct signal. Hair re-growth may occur without treatment and can even restart after years of hair loss.  CAUSES  It is thought that something triggers the immune system to stop hair growth. It is not always known what the cause is. Some people have genetic markers that can increase the chance of developing alopecia areata. Alopecia areata often occurs in families whose members have had: Asthma. Hay fever. Atopic eczema. Some autoimmune diseases may also be a trigger, such as: Thyroid disease. Diabetes. Rheumatoid arthritis. Lupus erythematosus. Vitiligo. Pernicious anemia. Addison's disease. OTHER SYMPTOMS In some people, the nail beds may develop rows of tiny dents (stippling) or the nail beds can become distorted. Other than the hair and nail beds, no other body part is affected.  PROGNOSIS  Alopecia areata is not medically disabling. People with alopecia areata are usually in excellent health. Hair loss can be emotionally difficult. The National Alopecia Areata Foundation has resources available to help individuals and families with alopecia areata. Their goal is to help people with the condition live full, productive lives. There are many successful, well-adjusted, contented people living with Alopecia areata. Alopecia areata can be overcome with: A positive self image. Sound medical facts. The support of others, such as: Sometimes professional counseling is helpful to develop one's self-confidence and positive self-image. TREATMENT  There is no cure for alopecia areata. There are several available treatments. Treatments are most  effective in milder cases. No treatment is effective for everyone. Choice of treatment depends  mainly on a person's age and the extent of their hair loss. Alopecia areata occurs in two forms:  A mild patchy form where less than 50 percent of scalp hair is lost. An extensive form where greater than 50 percent of scalp hair is lost. These two forms of alopecia areata behave quite differently, and the choice of treatment depends on which form is present. Current treatments do not turn alopecia areata off. They can stimulate the hair follicle to produce hair.  Some medications used to treat mild cases include: Cortisone injections. The most common treatment is the injection of cortisone into the bare skin patches. The injections are usually given by a caregiver specializing in skin issues (dermatologist). This caregiver will use a tiny needle to give multiple injections into the skin in and around the bare patches. The injections are repeated once a month. If new hair growth occurs, it is usually visible within 4 weeks. Treatment does not prevent new patches of hair loss from developing. There are few side effects from local cortisone injections. Occasionally, temporary dents (depressions) in the skin result from the local injections, but these dents can fill in by themselves. Topical minoxidil. Five percent topical minoxidil solution applied twice daily may grow hair in alopecia areata. Scalp, eyebrows, and beard hair may respond. If scalp hair re-grows completely, treatment can be stopped. Response may improve if topical cortisone cream is applied 30 minutes after the minoxidil. Topical minoxidil is safe, easy to use, and does not lower blood pressure in persons with normal blood pressure. Minoxidil can lead to unwanted facial hair growth in some people. Anthralin cream or ointment. Another treatment is the application of anthralin cream or ointment. Anthralin is a tar-like substance that has been used widely for psoriasis. Anthralin is applied to the bare patches once daily. It is washed off after a  short time, usually 30 to 60 minutes later. If new hair growth occurs, it is seen in 8 to 12 weeks. Anthralin can be irritating to the skin. It can cause temporary, brownish discoloration of the treated skin. By using short treatment times, skin irritation and skin staining are reduced without decreasing effectiveness. Care must be taken not to get anthralin in the eyes. Some of the medications used for more extensive cases where there is greater than 50% hair loss include: Cortisone pills. Cortisone pills are sometimes given for extensive scalp hair loss. Cortisone taken internally is much stronger than local injections of cortisone into the skin. It is necessary to discuss possible side effects of cortisone pills with your caregiver. In general, however, cortisone pills are used in relatively few patients with alopecia areata due to health risks from prolonged use. Also, hair that has grown is likely to fall out when the cortisone pills are stopped. Topical minoxidil. See previous explanation under mild, patchy alopecia areata. However, minoxidil is not effective in total loss of scalp hair (alopecia totalis). Topical immunotherapy. Another method of treating alopecia areata or alopecia totalis/universalis involves producing an allergic rash or allergic contact dermatitis. Chemicals such as diphencyprone (DPCP) or squaric acid dibutyl ester (SADBE) are applied to the scalp to produce an allergic rash which resembles poison oak or ivy. Approximately 40% of patients treated with topical immunotherapy will re-grow scalp hair after about 6 months of treatment. Those who do successfully re-grow scalp hair will need to continue treatment to maintain hair re-growth. Wigs. For extensive hair  loss, a wig can be an important option for some people. Proper attention will make a quality wig look completely natural. A wig will need to be cut, thinned, and styled. To keep a net base wig from falling off, special  double-sided tape can be purchased in beauty supply outlets and fastened to the inside of the wig. For those with completely bare heads, there are suction caps to which any wig can be attached. There are also entire suction cap wig units. FOR MORE INFORMATION National Alopecia Areata Foundation: [employer] Document Released: 07/02/2004 Document Revised: 02/20/2012 Document Reviewed: 02/17/2014 [facility] Patient Information 2015 [place], [place]. This information is not intended to replace advice given to you by your health care provider. Make sure you discuss any questions you have with your health care provider.

## 2014-08-21 NOTE — Progress Notes (Signed)
Patient ID: Christina Mendez, female   DOB: Jun 03, 1976, 38 y.o.   MRN: [id number] MEISHA SALONE [id number] 04/26/1976 08/21/2014      Progress Note-Follow Up  Subjective  Chief Complaint  Chief Complaint  Patient presents with  . Follow-up    3 month  . Injections    flu    HPI  Patient is a 38 year old female in today for routine medical care. Is in today c/o 2 episodes of urinary incontinence in the past year but one was recently overwhelming and sudden. Is considering urology referral, denies dysuria, fevers, abdominal pain, back pain. Denies CP/palp/SOB/HA/congestion/fevers/GI or GU c/o. Taking meds as prescribed  Past Medical History  Diagnosis Date  . Heartburn   . Chicken pox 3rd grade  . Hypertension   . Allergy last couple of years    seasonal  . HTN (hypertension) 12/09/2011  . Obese 12/09/2011  . Sinusitis 01/08/2012  . Cervical cancer screening 03/02/2012  . Allergic state 03/02/2012  . Inflamed skin tag 03/02/2012  . Ovarian cyst 08/17/2012    Past Surgical History  Procedure Laterality Date  . Cyst removed  38 yr old    benign, abdominal    Family History  Problem Relation Age of Onset  . Fibromyalgia Mother   . Diabetes Mother     type 2  . Hypertension Mother   . Diabetes Father     type 2  . Heart disease Father 21    2 MI  . Hypertension Father   . Heart attack Father 23    X 2  . Stroke Father     mini strokes  . GER disease Father   . Hyperlipidemia Father   . Cholelithiasis Father   . Venous thrombosis Father     in liver  . Hypertension Brother   . Venous thrombosis Brother     on spine  . Cancer Maternal Grandfather     stomach  . Hypertension Paternal Grandmother   . Diabetes Paternal Grandmother     type 2  . Heart attack Paternal Grandmother   . Heart disease Paternal Grandmother   . Heart attack Paternal Grandfather   . Heart disease Paternal Grandfather   . Hypertension Paternal Grandfather   . Cancer Maternal  Uncle     prostate cancer    History   Social History  . Marital Status: Single    Spouse Name: N/A    Number of Children: N/A  . Years of Education: N/A   Occupational History  . Not on file.   Social History Main Topics  . Smoking status: Never Smoker   . Smokeless tobacco: Never Used  . Alcohol Use: Yes     Comment: OCCASIONALLY  . Drug Use: No  . Sexual Activity: Yes    Partners: Male     Comment: No dietary restrictions, lives with Mom, Dad and brother and niece.  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 Not on file   Social History Narrative  . No narrative on file    Current Outpatient Prescriptions on File Prior to Visit  Medication Sig Dispense Refill  . aspirin 500 MG tablet Take 500 mg by mouth every 6 (six) hours as needed for pain.      . chlorthalidone (HYGROTON) 25 MG tablet Take one tablet by mouth one time daily  30 tablet  4  . fluticasone (FLONASE) 50 MCG/ACT nasal spray Place 2 sprays into the nose  daily as needed for rhinitis or allergies.  16 g  2  . ranitidine (ZANTAC) 300 MG tablet Take 1 tablet (300 mg total) by mouth daily.  30 tablet  6  . TGT ALL DAY ALLERGY RELIEF 10 MG tablet Take one tablet by mouth one time daily  30 tablet  4   No current facility-administered medications on file prior to visit.    Allergies  Allergen Reactions  . Skelaxin Hypertension    Review of Systems  Review of Systems  Constitutional: Negative for fever, chills and malaise/fatigue.  HENT: Negative for congestion, hearing loss and nosebleeds.   Eyes: Negative for discharge.  Respiratory: Negative for cough, sputum production, shortness of breath and wheezing.   Cardiovascular: Negative for chest pain, palpitations and leg swelling.  Gastrointestinal: Negative for heartburn, nausea, vomiting, abdominal pain, diarrhea, constipation and blood in stool.  Genitourinary: Negative for dysuria, urgency, frequency and hematuria.  Musculoskeletal:  Negative for back pain, falls and myalgias.  Skin: Negative for rash.  Neurological: Negative for dizziness, tremors, sensory change, focal weakness, loss of consciousness, weakness and headaches.  Endo/Heme/Allergies: Negative for polydipsia. Does not bruise/bleed easily.  Psychiatric/Behavioral: Negative for depression and suicidal ideas. The patient is not nervous/anxious and does not have insomnia.     Objective  BP 138/84  Pulse 96  Temp(Src) 97.7 F (36.5 C) (Oral)  Ht 5' 7.5" (1.715 m)  Wt 337 lb (152.862 kg)  BMI 51.97 kg/m2  SpO2 96%  Physical Exam  Physical Exam  Constitutional: She is oriented to person, place, and time and well-developed, well-nourished, and in no distress. No distress.  HENT:  Head: Normocephalic and atraumatic.  Right Ear: External ear normal.  Left Ear: External ear normal.  Nose: Nose normal.  Mouth/Throat: Oropharynx is clear and moist. No oropharyngeal exudate.  Eyes: Conjunctivae are normal. Pupils are equal, round, and reactive to light. Right eye exhibits no discharge. Left eye exhibits no discharge. No scleral icterus.  Neck: Normal range of motion. Neck supple. No thyromegaly present.  Cardiovascular: Normal rate, regular rhythm, normal heart sounds and intact distal pulses.   No murmur heard. Pulmonary/Chest: Effort normal and breath sounds normal. No respiratory distress. She has no wheezes. She has no rales.  Abdominal: Soft. Bowel sounds are normal. She exhibits no distension and no mass. There is no tenderness.  Musculoskeletal: Normal range of motion. She exhibits no edema and no tenderness.  Lymphadenopathy:    She has no cervical adenopathy.  Neurological: She is alert and oriented to person, place, and time. She has normal reflexes. No cranial nerve deficit. Coordination normal.  Skin: Skin is warm and dry. No rash noted. She is not diaphoretic.  Psychiatric: Mood, memory and affect normal.    Lab Results  Component Value  Date   TSH 0.828 03/31/2014   Lab Results  Component Value Date   WBC 7.0 03/31/2014   HGB 13.9 03/31/2014   HCT 38.9 03/31/2014   MCV 83.8 03/31/2014   PLT 341 03/31/2014   Lab Results  Component Value Date   CREATININE 0.76 03/31/2014   BUN 8 03/31/2014   NA 134* 03/31/2014   K 3.4* 03/31/2014   CL 98 03/31/2014   CO2 29 03/31/2014   Lab Results  Component Value Date   ALT 16 03/31/2014   AST 17 03/31/2014   ALKPHOS 44 03/31/2014   BILITOT 0.5 03/31/2014   Lab Results  Component Value Date   CHOL 171 03/31/2014   Lab Results  Component Value Date   HDL 51 03/31/2014   Lab Results  Component Value Date   LDLCALC 102* 03/31/2014   Lab Results  Component Value Date   TRIG 88 03/31/2014   Lab Results  Component Value Date   CHOLHDL 3.4 03/31/2014     Assessment & Plan   OSA (obstructive sleep apnea) Uses CPAP nightly, feels better  HTN (hypertension) Well controlled, no changes to meds. Encouraged heart healthy diet such as the DASH diet and exercise as tolerated.   Obese Encouraged DASH diet, decrease po intake and increase exercise as tolerated. Needs 7-8 hours of sleep nightly. Avoid trans fats, eat small, frequent meals every 4-5 hours with lean proteins, complex carbs and healthy fats. Minimize simple carbs  Heartburn Avoid offending foods, start probiotics. Do not eat large meals in late evening and consider raising head of bed.   Insomnia Encouraged good sleep hygiene such as dark, quiet room. No blue/green glowing lights such as computer screens in bedroom. No alcohol or stimulants in evening. Cut down on caffeine as able. Regular exercise is helpful but not just prior to bed time.   Other and unspecified hyperlipidemia Encouraged heart healthy diet, increase exercise, avoid trans fats, consider a krill oil cap daily  Hyponatremia resolved

## 2014-08-22 ENCOUNTER — Ambulatory Visit: Payer: BC Managed Care – PPO

## 2014-08-22 DIAGNOSIS — R946 Abnormal results of thyroid function studies: Secondary | ICD-10-CM

## 2014-08-22 LAB — URINE CULTURE
Colony Count: NO GROWTH
Organism ID, Bacteria: NO GROWTH

## 2014-08-23 ENCOUNTER — Other Ambulatory Visit: Payer: Self-pay | Admitting: Family Medicine

## 2014-08-23 DIAGNOSIS — E876 Hypokalemia: Secondary | ICD-10-CM

## 2014-08-23 MED ORDER — POTASSIUM CHLORIDE CRYS ER 20 MEQ PO TBCR
20.0000 meq | EXTENDED_RELEASE_TABLET | Freq: Every day | ORAL | Status: DC
Start: 2014-08-23 — End: 2015-07-28

## 2014-08-24 DIAGNOSIS — E871 Hypo-osmolality and hyponatremia: Secondary | ICD-10-CM | POA: Insufficient documentation

## 2014-08-24 DIAGNOSIS — E782 Mixed hyperlipidemia: Secondary | ICD-10-CM | POA: Insufficient documentation

## 2014-08-24 DIAGNOSIS — R32 Unspecified urinary incontinence: Secondary | ICD-10-CM | POA: Insufficient documentation

## 2014-08-24 NOTE — Assessment & Plan Note (Signed)
Well controlled, no changes to meds. Encouraged heart healthy diet such as the DASH diet and exercise as tolerated.  °

## 2014-08-24 NOTE — Assessment & Plan Note (Signed)
Uses CPAP nightly, feels better

## 2014-08-24 NOTE — Assessment & Plan Note (Signed)
Encouraged DASH diet, decrease po intake and increase exercise as tolerated. Needs 7-8 hours of sleep nightly. Avoid trans fats, eat small, frequent meals every 4-5 hours with lean proteins, complex carbs and healthy fats. Minimize simple carbs 

## 2014-08-24 NOTE — Assessment & Plan Note (Signed)
Avoid offending foods, start probiotics. Do not eat large meals in late evening and consider raising head of bed.  

## 2014-08-24 NOTE — Assessment & Plan Note (Signed)
Encouraged heart healthy diet, increase exercise, avoid trans fats, consider a krill oil cap daily 

## 2014-08-24 NOTE — Assessment & Plan Note (Signed)
Encouraged good sleep hygiene such as dark, quiet room. No blue/green glowing lights such as computer screens in bedroom. No alcohol or stimulants in evening. Cut down on caffeine as able. Regular exercise is helpful but not just prior to bed time.  

## 2014-08-24 NOTE — Assessment & Plan Note (Signed)
resolved 

## 2014-08-25 LAB — T4, FREE: FREE T4: 1.07 ng/dL (ref 0.60–1.60)

## 2014-08-25 NOTE — Addendum Note (Signed)
Addended by: Court Joy on: 08/25/2014 11:51 AM   Modules accepted: Orders

## 2014-09-04 ENCOUNTER — Telehealth: Payer: Self-pay | Admitting: Family Medicine

## 2014-09-04 NOTE — Telephone Encounter (Signed)
Can we see why patient is wanting to be seen? That would determine how long the visit is?

## 2014-09-04 NOTE — Telephone Encounter (Signed)
Caller name: Jennnifer  Relation to pt: self  Call back number: [phone]   Reason for call:   Pt scheduled lab appointment 09/05/2014 and requesting to see Dr. Abner Greenspan anytime tomorrow. Pt would like a f/u call and a detailed message, pt is at work doesn't get off until 5:30pm

## 2014-09-05 ENCOUNTER — Ambulatory Visit (INDEPENDENT_AMBULATORY_CARE_PROVIDER_SITE_OTHER): Payer: BC Managed Care – PPO | Admitting: Medical

## 2014-09-05 ENCOUNTER — Other Ambulatory Visit (INDEPENDENT_AMBULATORY_CARE_PROVIDER_SITE_OTHER): Payer: BC Managed Care – PPO

## 2014-09-05 ENCOUNTER — Encounter: Payer: Self-pay | Admitting: Medical

## 2014-09-05 VITALS — BP 118/77 | HR 74 | Temp 98.5°F | Ht 68.5 in | Wt 341.0 lb

## 2014-09-05 DIAGNOSIS — J302 Other seasonal allergic rhinitis: Secondary | ICD-10-CM

## 2014-09-05 DIAGNOSIS — J0100 Acute maxillary sinusitis, unspecified: Secondary | ICD-10-CM

## 2014-09-05 DIAGNOSIS — R319 Hematuria, unspecified: Secondary | ICD-10-CM

## 2014-09-05 DIAGNOSIS — J3089 Other allergic rhinitis: Secondary | ICD-10-CM

## 2014-09-05 DIAGNOSIS — R059 Cough, unspecified: Secondary | ICD-10-CM | POA: Insufficient documentation

## 2014-09-05 DIAGNOSIS — J309 Allergic rhinitis, unspecified: Secondary | ICD-10-CM | POA: Insufficient documentation

## 2014-09-05 DIAGNOSIS — E876 Hypokalemia: Secondary | ICD-10-CM

## 2014-09-05 DIAGNOSIS — R05 Cough: Secondary | ICD-10-CM

## 2014-09-05 LAB — RENAL FUNCTION PANEL
ALBUMIN: 3.7 g/dL (ref 3.5–5.2)
BUN: 7 mg/dL (ref 6–23)
CHLORIDE: 104 meq/L (ref 96–112)
CO2: 24 mEq/L (ref 19–32)
Calcium: 9.1 mg/dL (ref 8.4–10.5)
Creatinine, Ser: 0.8 mg/dL (ref 0.4–1.2)
GFR: 100.88 mL/min (ref >60.00)
Glucose, Bld: 92 mg/dL (ref 70–99)
PHOSPHORUS: 2.8 mg/dL (ref 2.3–4.6)
POTASSIUM: 3.9 meq/L (ref 3.5–5.1)
Sodium: 136 mEq/L (ref 135–145)

## 2014-09-05 LAB — URINALYSIS, ROUTINE W REFLEX MICROSCOPIC
Bilirubin Urine: NEGATIVE
HGB URINE DIPSTICK: NEGATIVE
KETONES UR: NEGATIVE
Leukocytes, UA: NEGATIVE
Nitrite: NEGATIVE
RBC / HPF: NONE SEEN (ref 0–?)
SPECIFIC GRAVITY, URINE: 1.015 (ref 1.000–1.030)
Total Protein, Urine: NEGATIVE
URINE GLUCOSE: NEGATIVE
UROBILINOGEN UA: 0.2 (ref 0.0–1.0)
pH: 8 (ref 5.0–8.0)

## 2014-09-05 MED ORDER — BENZONATATE 100 MG PO CAPS: 100.0000 mg | ORAL_CAPSULE | Freq: Three times a day (TID) | ORAL | Status: DC | PRN

## 2014-09-05 MED ORDER — CEFDINIR 300 MG PO CAPS: 300.0000 mg | ORAL_CAPSULE | Freq: Two times a day (BID) | ORAL | Status: DC

## 2014-09-05 MED ORDER — PREDNISONE 20 MG PO TABS: ORAL_TABLET | ORAL | Status: DC

## 2014-09-05 MED ORDER — ALBUTEROL SULFATE HFA 108 (90 BASE) MCG/ACT IN AERS: 2.0000 | INHALATION_SPRAY | Freq: Four times a day (QID) | RESPIRATORY_TRACT | Status: DC | PRN

## 2014-09-05 NOTE — Assessment & Plan Note (Signed)
Rx of cefdinir. 

## 2014-09-05 NOTE — Assessment & Plan Note (Signed)
Continue your present allergy meds and adding prednisone.

## 2014-09-05 NOTE — Telephone Encounter (Signed)
Caller name: Chad  Relation to pt: self  Call back number: [phone]   Reason for call:   Patient called this morning she states she has a sinus infection.

## 2014-09-05 NOTE — Telephone Encounter (Signed)
Pt was seen in office today and would like to discuss her "sinus infection" pt states singular is not working. Please advise [phone]

## 2014-09-05 NOTE — Progress Notes (Signed)
   Subjective:    Patient ID: [name], female    DOB: 22-Dec-1975, 38 y.o.   MRN: [id number]  HPI  Pt in with feeling sick for over two weeks. Sinus pressure and drainage. PND. Blowing out brown mucous from her nose. No fevers or chills. Some frontal sinus pressure. Pt is sneezing some. Coughing moderate and productive.   She is coughing a lot at night and feel little more winded than usual.  Pt is on zyrtec, singulair, pt tell me using fluticasone.   Pt was in last week but not as bad. Last Saturday progressivley getting worse.  LMP- Last Tuesday. Pt is not diabetic.    Review of Systems  Constitutional: Negative for fever, chills and fatigue.  HENT: Positive for congestion, postnasal drip, rhinorrhea, sinus pressure and sneezing. Negative for ear pain, nosebleeds and sore throat.   Respiratory: Positive for cough and shortness of breath. Negative for chest tightness and wheezing.        Occasional feels winded mildly. Maybe wheezing per pt.  Cardiovascular: Negative for chest pain and palpitations.  Gastrointestinal: Negative for nausea, vomiting, abdominal pain, diarrhea, constipation, blood in stool, abdominal distention and anal bleeding.  Genitourinary: Negative.   Musculoskeletal: Negative for back pain.       No leg pain.  Neurological: Negative.   Hematological: Negative for adenopathy. Does not bruise/bleed easily.       Objective:   Physical Exam  General  Mental Status - Alert. General Appearance - Well groomed. Not in acute distress.  Skin Rashes- No Rashes.  HEENT Head- Normal. Ear Auditory Canal - Left- Normal. Right - Normal.Tympanic Membrane- Left- Normal. Right- Normal. Eye Sclera/Conjunctiva- Left- Normal. Right- Normal. Nose & Sinuses Nasal Mucosa- Left-  Boggy + Congested. Right-  Boggy + Congested. Faint maxillary sinus pressure. Mouth & Throat Lips: Upper Lip- Normal: no dryness, cracking, pallor, cyanosis, or vesicular eruption.  Lower Lip-Normal: no dryness, cracking, pallor, cyanosis or vesicular eruption. Buccal Mucosa- Bilateral- No Aphthous ulcers. Oropharynx- No Discharge or Erythema. But pnd. Tonsils: Characteristics- Bilateral- No Erythema or Congestion. Size/Enlargement- Bilateral- No enlargement. Discharge- bilateral-None.  Neck Neck- Supple. No Masses.   Chest and Lung Exam Auscultation: Breath Sounds:-Normal, clear, even and unlabored  Cardiovascular Auscultation:Rythm- Regular, rate and rhythm. Murmurs & Other Heart Sounds:Ausculatation of the heart reveal- No Murmurs.  Lymphatic Head & Neck General Head & Neck Lymphatics: Bilateral: Description- No Localized lymphadenopathy.         Assessment & Plan:

## 2014-09-05 NOTE — Telephone Encounter (Signed)
appt scheduled to see Christina Mendez at 2:30 today. Pt informed and voiced understanding

## 2014-09-05 NOTE — Assessment & Plan Note (Signed)
May be from bronchitis and reactive airway disease Rx benzonatate for cough. Prednisone would help with RAD. Made albuterol inhaler available if she starts to wheeze.

## 2014-09-05 NOTE — Patient Instructions (Signed)
For your allergies continue current meds.  Your appear to have probable sinusitis and bronchitis following allergic rhinitis. Will add cefdnir rx.   For your associated cough. I am making benzonatate available.  Your severe cough at night  This may indicate reactive airways with subsequent tightening of airways. Prednisone rx should help this as well as allergies. If you have obvious wheezing or sob then use albuterol inhaler.  Follow up in 7 days or as needed.

## 2014-09-08 ENCOUNTER — Telehealth: Payer: Self-pay | Admitting: *Deleted

## 2014-09-08 ENCOUNTER — Ambulatory Visit: Payer: BC Managed Care – PPO | Admitting: Family Medicine

## 2014-09-08 DIAGNOSIS — Z0289 Encounter for other administrative examinations: Secondary | ICD-10-CM

## 2014-09-08 NOTE — Telephone Encounter (Signed)
Pt did not show for appointment 09/08/2014 at 10:15am for steady cough, mucus

## 2014-10-28 ENCOUNTER — Telehealth: Payer: Self-pay | Admitting: Pulmonary Disease

## 2014-10-28 NOTE — Telephone Encounter (Signed)
Per Dr. Vassie LollAlva, 12 cm,  CPAP effective.  Can she increase usage?

## 2014-10-28 NOTE — Telephone Encounter (Signed)
Lmtcb. 11/17 

## 2014-10-29 NOTE — Telephone Encounter (Signed)
Lmtcb. 11/18 

## 2014-10-30 NOTE — Telephone Encounter (Signed)
Pt states she has been skipping nights because she has been having a lot of PND. She states the moisture makes her PND worse and she cannot tolerate it and takes the mask off. Please advise. Carron CurieJennifer Tenasia Aull, CMA

## 2014-10-30 NOTE — Telephone Encounter (Signed)
lmtcb 11/19

## 2014-10-30 NOTE — Telephone Encounter (Signed)
She has flonase - 1 spray each nare at bedtime Add claritin daily if she is already doing flonase

## 2014-10-31 NOTE — Telephone Encounter (Signed)
Lmtcb. 11/20 

## 2014-11-03 NOTE — Telephone Encounter (Signed)
Lmtcb. 11/23 

## 2014-11-05 NOTE — Telephone Encounter (Signed)
Left detailed message on voicemail advising patient of Dr. Reginia NaasAlva's instructions.  Asked patient to call back if she has any questions.  Nothing further needed at this time.

## 2014-11-20 ENCOUNTER — Encounter: Payer: Self-pay | Admitting: Family Medicine

## 2014-11-20 ENCOUNTER — Ambulatory Visit (INDEPENDENT_AMBULATORY_CARE_PROVIDER_SITE_OTHER): Payer: BC Managed Care – PPO | Admitting: Family Medicine

## 2014-11-20 VITALS — BP 124/82 | HR 65 | Temp 98.5°F | Ht 67.5 in | Wt 351.8 lb

## 2014-11-20 DIAGNOSIS — I10 Essential (primary) hypertension: Secondary | ICD-10-CM

## 2014-11-20 DIAGNOSIS — E782 Mixed hyperlipidemia: Secondary | ICD-10-CM

## 2014-11-20 DIAGNOSIS — R946 Abnormal results of thyroid function studies: Secondary | ICD-10-CM

## 2014-11-20 DIAGNOSIS — E876 Hypokalemia: Secondary | ICD-10-CM

## 2014-11-20 DIAGNOSIS — E871 Hypo-osmolality and hyponatremia: Secondary | ICD-10-CM

## 2014-11-20 DIAGNOSIS — R12 Heartburn: Secondary | ICD-10-CM

## 2014-11-20 DIAGNOSIS — R7989 Other specified abnormal findings of blood chemistry: Secondary | ICD-10-CM

## 2014-11-20 DIAGNOSIS — E669 Obesity, unspecified: Secondary | ICD-10-CM

## 2014-11-20 DIAGNOSIS — J0100 Acute maxillary sinusitis, unspecified: Secondary | ICD-10-CM

## 2014-11-20 NOTE — Patient Instructions (Signed)

## 2014-11-20 NOTE — Progress Notes (Signed)
Pre visit review using our clinic review tool, if applicable. No additional management support is needed unless otherwise documented below in the visit note. 

## 2014-11-21 LAB — RENAL FUNCTION PANEL
Albumin: 3.6 g/dL (ref 3.5–5.2)
BUN: 8 mg/dL (ref 6–23)
CALCIUM: 8.6 mg/dL (ref 8.4–10.5)
CO2: 25 mEq/L (ref 19–32)
Chloride: 104 mEq/L (ref 96–112)
Creatinine, Ser: 0.8 mg/dL (ref 0.4–1.2)
GFR: 110.13 mL/min (ref >60.00)
GLUCOSE: 75 mg/dL (ref 70–99)
POTASSIUM: 3.6 meq/L (ref 3.5–5.1)
Phosphorus: 3.8 mg/dL (ref 2.3–4.6)
Sodium: 134 mEq/L — ABNORMAL LOW (ref 135–145)

## 2014-11-21 LAB — T4, FREE: FREE T4: 1.02 ng/dL (ref 0.60–1.60)

## 2014-11-21 LAB — TSH: TSH: 0.83 u[IU]/mL (ref 0.35–4.50)

## 2014-12-01 ENCOUNTER — Encounter: Payer: Self-pay | Admitting: Family Medicine

## 2014-12-01 NOTE — Assessment & Plan Note (Signed)
Encouraged DASH diet, decrease po intake and increase exercise as tolerated. Needs 7-8 hours of sleep nightly. Avoid trans fats, eat small, frequent meals every 4-5 hours with lean proteins, complex carbs and healthy fats. Minimize simple carbs, GMO foods. 

## 2014-12-01 NOTE — Assessment & Plan Note (Signed)
Well controlled, no changes to meds. Encouraged heart healthy diet such as the DASH diet and exercise as tolerated.  °

## 2014-12-01 NOTE — Assessment & Plan Note (Signed)
Encouraged heart healthy diet, increase exercise, avoid trans fats, consider a krill oil cap daily 

## 2014-12-01 NOTE — Progress Notes (Signed)
Christina Mendez  [id number] 1976-01-19 12/01/2014      Progress Note-Follow Up  Subjective  Chief Complaint  Chief Complaint  Patient presents with  . Follow-up    3 month    HPI  Patient is a 38 y.o. female in today for routine medical care. Patient is in today for follow-up. Feels fairly well. Does note some dyspepsia and is frustrated with her weight gain. No recurrent illness status post her sinusitis. Her congestion is improved. Denies CP/palp/SOB/HA/congestion/fevers/GI or GU c/o. Taking meds as prescribed  Past Medical History  Diagnosis Date  . Heartburn   . Chicken pox 3rd grade  . Hypertension   . Allergy last couple of years    seasonal  . HTN (hypertension) 12/09/2011  . Obese 12/09/2011  . Sinusitis 01/08/2012  . Cervical cancer screening 03/02/2012  . Allergic state 03/02/2012  . Inflamed skin tag 03/02/2012  . Ovarian cyst 08/17/2012    Past Surgical History  Procedure Laterality Date  . Cyst removed  38 yr old    benign, abdominal    Family History  Problem Relation Age of Onset  . Fibromyalgia Mother   . Diabetes Mother     type 2  . Hypertension Mother   . Diabetes Father     type 2  . Heart disease Father 16    2 MI  . Hypertension Father   . Heart attack Father 23    X 2  . Stroke Father     mini strokes  . GER disease Father   . Hyperlipidemia Father   . Cholelithiasis Father   . Venous thrombosis Father     in liver  . Hypertension Brother   . Venous thrombosis Brother     on spine  . Cancer Maternal Grandfather     stomach  . Hypertension Paternal Grandmother   . Diabetes Paternal Grandmother     type 2  . Heart attack Paternal Grandmother   . Heart disease Paternal Grandmother   . Heart attack Paternal Grandfather   . Heart disease Paternal Grandfather   . Hypertension Paternal Grandfather   . Cancer Maternal Uncle     prostate cancer    History   Social History  . Marital Status: Single    Spouse Name: N/A   Number of Children: N/A  . Years of Education: N/A   Occupational History  . Not on file.   Social History Main Topics  . Smoking status: Never Smoker   . Smokeless tobacco: Never Used  . Alcohol Use: Yes     Comment: OCCASIONALLY  . Drug Use: No  . Sexual Activity:    Partners: Male     Comment: No dietary restrictions, lives with Mom, Dad and brother and niece.  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 Not on file   Social History Narrative    Current Outpatient Prescriptions on File Prior to Visit  Medication Sig Dispense Refill  . albuterol (PROVENTIL HFA;VENTOLIN HFA) 108 (90 BASE) MCG/ACT inhaler Inhale 2 puffs into the lungs every 6 (six) hours as needed for wheezing or shortness of breath. 1 Inhaler 0  . aspirin 500 MG tablet Take 500 mg by mouth every 6 (six) hours as needed for pain.    . fluticasone (FLONASE) 50 MCG/ACT nasal spray Place 2 sprays into the nose daily as needed for rhinitis or allergies. 16 g 2  . glucosamine-chondroitin 500-400 MG tablet Take 1 tablet by mouth  daily.    . metoprolol succinate (TOPROL-XL) 25 MG 24 hr tablet Take 1 tablet (25 mg total) by mouth daily. 90 tablet 1  . montelukast (SINGULAIR) 10 MG tablet Take 1 tablet (10 mg total) by mouth at bedtime. 30 tablet 3  . Multiple Vitamin (MULTIVITAMIN) tablet Take 1 tablet by mouth daily.    . potassium chloride SA (K-DUR,KLOR-CON) 20 MEQ tablet Take 1 tablet (20 mEq total) by mouth daily. 1 tab po daily x 7 days then as needed. 30 tablet 0  . ranitidine (ZANTAC) 300 MG tablet Take 1 tablet (300 mg total) by mouth daily. 30 tablet 6  . TGT ALL DAY ALLERGY RELIEF 10 MG tablet Take one tablet by mouth one time daily 30 tablet 4   No current facility-administered medications on file prior to visit.    Allergies  Allergen Reactions  . Skelaxin Hypertension    Review of Systems  Review of Systems  Constitutional: Negative for fever and malaise/fatigue.  HENT: Negative for  congestion.   Eyes: Negative for discharge.  Respiratory: Negative for shortness of breath.   Cardiovascular: Negative for chest pain, palpitations and leg swelling.  Gastrointestinal: Negative for nausea, abdominal pain and diarrhea.  Genitourinary: Negative for dysuria.  Musculoskeletal: Negative for falls.  Skin: Negative for rash.  Neurological: Negative for loss of consciousness and headaches.  Endo/Heme/Allergies: Negative for polydipsia.  Psychiatric/Behavioral: Negative for depression and suicidal ideas. The patient is not nervous/anxious and does not have insomnia.     Objective  BP 124/82 mmHg  Pulse 65  Temp(Src) 98.5 F (36.9 C) (Oral)  Ht 5' 7.5" (1.715 m)  Wt 351 lb 12.8 oz (159.575 kg)  BMI 54.25 kg/m2  SpO2 94%  LMP 10/21/2014  Physical Exam  Physical Exam  Constitutional: She is oriented to person, place, and time and well-developed, well-nourished, and in no distress. No distress.  HENT:  Head: Normocephalic and atraumatic.  Eyes: Conjunctivae are normal.  Neck: Neck supple. No thyromegaly present.  Cardiovascular: Normal rate, regular rhythm and normal heart sounds.   No murmur heard. Pulmonary/Chest: Effort normal and breath sounds normal. She has no wheezes.  Abdominal: She exhibits no distension and no mass.  Musculoskeletal: She exhibits no edema.  Lymphadenopathy:    She has no cervical adenopathy.  Neurological: She is alert and oriented to person, place, and time.  Skin: Skin is warm and dry. No rash noted. She is not diaphoretic.  Psychiatric: Memory, affect and judgment normal.    Lab Results  Component Value Date   TSH 0.83 11/20/2014   Lab Results  Component Value Date   WBC 5.3 08/21/2014   HGB 13.6 08/21/2014   HCT 40.5 08/21/2014   MCV 88.7 08/21/2014   PLT 284.0 08/21/2014   Lab Results  Component Value Date   CREATININE 0.8 11/20/2014   BUN 8 11/20/2014   NA 134* 11/20/2014   K 3.6 11/20/2014   CL 104 11/20/2014   CO2  25 11/20/2014   Lab Results  Component Value Date   ALT 16 03/31/2014   AST 17 03/31/2014   ALKPHOS 44 03/31/2014   BILITOT 0.5 03/31/2014   Lab Results  Component Value Date   CHOL 170 08/21/2014   Lab Results  Component Value Date   HDL 40.50 08/21/2014   Lab Results  Component Value Date   LDLCALC 98 08/21/2014   Lab Results  Component Value Date   TRIG 159.0* 08/21/2014   Lab Results  Component Value Date  CHOLHDL 4 08/21/2014     Assessment & Plan  HTN (hypertension) Well controlled, no changes to meds. Encouraged heart healthy diet such as the DASH diet and exercise as tolerated.   Sinusitis resolved  Hyponatremia Mild, asymptomatic, will monitor  Hyperlipidemia, mixed Encouraged heart healthy diet, increase exercise, avoid trans fats, consider a krill oil cap daily  Heartburn Avoid offending foods, start probiotics. Do not eat large meals in late evening and consider raising head of bed.   Obese Encouraged DASH diet, decrease po intake and increase exercise as tolerated. Needs 7-8 hours of sleep nightly. Avoid trans fats, eat small, frequent meals every 4-5 hours with lean proteins, complex carbs and healthy fats. Minimize simple carbs, GMO foods.

## 2014-12-01 NOTE — Assessment & Plan Note (Signed)
Avoid offending foods, start probiotics. Do not eat large meals in late evening and consider raising head of bed.  

## 2014-12-01 NOTE — Assessment & Plan Note (Signed)
Mild, asymptomatic, will monitor 

## 2014-12-01 NOTE — Assessment & Plan Note (Signed)
resolved 

## 2015-02-05 ENCOUNTER — Other Ambulatory Visit: Payer: Self-pay | Admitting: Family Medicine

## 2015-02-28 ENCOUNTER — Other Ambulatory Visit: Payer: Self-pay | Admitting: Family Medicine

## 2015-03-02 NOTE — Telephone Encounter (Signed)
Med filled.  

## 2015-05-04 ENCOUNTER — Encounter: Payer: Self-pay | Admitting: Family Medicine

## 2015-05-04 ENCOUNTER — Other Ambulatory Visit: Payer: Self-pay | Admitting: Family Medicine

## 2015-05-04 ENCOUNTER — Ambulatory Visit (INDEPENDENT_AMBULATORY_CARE_PROVIDER_SITE_OTHER): Payer: BLUE CROSS/BLUE SHIELD | Admitting: Family Medicine

## 2015-05-04 ENCOUNTER — Telehealth: Payer: Self-pay | Admitting: Family Medicine

## 2015-05-04 ENCOUNTER — Encounter: Payer: Self-pay | Admitting: Internal Medicine

## 2015-05-04 VITALS — BP 144/100 | HR 58 | Temp 98.1°F | Ht 67.0 in | Wt 350.0 lb

## 2015-05-04 DIAGNOSIS — K219 Gastro-esophageal reflux disease without esophagitis: Secondary | ICD-10-CM | POA: Diagnosis not present

## 2015-05-04 DIAGNOSIS — R12 Heartburn: Secondary | ICD-10-CM

## 2015-05-04 DIAGNOSIS — E782 Mixed hyperlipidemia: Secondary | ICD-10-CM

## 2015-05-04 DIAGNOSIS — Z0000 Encounter for general adult medical examination without abnormal findings: Secondary | ICD-10-CM

## 2015-05-04 DIAGNOSIS — I10 Essential (primary) hypertension: Secondary | ICD-10-CM | POA: Diagnosis not present

## 2015-05-04 DIAGNOSIS — N920 Excessive and frequent menstruation with regular cycle: Secondary | ICD-10-CM | POA: Diagnosis not present

## 2015-05-04 HISTORY — DX: Excessive and frequent menstruation with regular cycle: N92.0

## 2015-05-04 LAB — CBC
HCT: 41.3 % (ref 36.0–46.0)
HEMOGLOBIN: 14 g/dL (ref 12.0–15.0)
MCHC: 33.9 g/dL (ref 30.0–36.0)
MCV: 88.1 fl (ref 78.0–100.0)
Platelets: 280 10*3/uL (ref 150.0–400.0)
RBC: 4.7 Mil/uL (ref 3.87–5.11)
RDW: 13.8 % (ref 11.5–15.5)
WBC: 5.6 10*3/uL (ref 4.0–10.5)

## 2015-05-04 LAB — COMPREHENSIVE METABOLIC PANEL
ALT: 17 U/L (ref 0–35)
AST: 18 U/L (ref 0–37)
Albumin: 4.1 g/dL (ref 3.5–5.2)
Alkaline Phosphatase: 57 U/L (ref 39–117)
BILIRUBIN TOTAL: 0.4 mg/dL (ref 0.2–1.2)
BUN: 8 mg/dL (ref 6–23)
CALCIUM: 8.9 mg/dL (ref 8.4–10.5)
CO2: 29 meq/L (ref 19–32)
Chloride: 104 mEq/L (ref 96–112)
Creatinine, Ser: 0.7 mg/dL (ref 0.40–1.20)
GFR: 118.99 mL/min (ref >60.00)
GLUCOSE: 83 mg/dL (ref 70–99)
POTASSIUM: 3.7 meq/L (ref 3.5–5.1)
Sodium: 137 mEq/L (ref 135–145)
TOTAL PROTEIN: 7.2 g/dL (ref 6.0–8.3)

## 2015-05-04 LAB — LIPID PANEL
Cholesterol: 160 mg/dL (ref 0–200)
HDL: 51.3 mg/dL (ref >39.00)
LDL Cholesterol: 94 mg/dL (ref 0–99)
NonHDL: 108.7
Total CHOL/HDL Ratio: 3
Triglycerides: 75 mg/dL (ref 0.0–149.0)
VLDL: 15 mg/dL (ref 0.0–40.0)

## 2015-05-04 LAB — H. PYLORI ANTIBODY, IGG: H Pylori IgG: NEGATIVE

## 2015-05-04 LAB — TSH: TSH: 1 u[IU]/mL (ref 0.35–4.50)

## 2015-05-04 LAB — T4, FREE: FREE T4: 1 ng/dL (ref 0.60–1.60)

## 2015-05-04 MED ORDER — OMEPRAZOLE 40 MG PO CPDR: 40.0000 mg | DELAYED_RELEASE_CAPSULE | Freq: Every day | ORAL | Status: DC

## 2015-05-04 MED ORDER — LOSARTAN POTASSIUM 25 MG PO TABS: 25.0000 mg | ORAL_TABLET | Freq: Every day | ORAL | Status: DC

## 2015-05-04 NOTE — Telephone Encounter (Signed)
Pharmacist received refill on  "[facility] all day allergy relief 10 mg".  They need clarification this could be loratidine or cetirizine  Advise which.  Call back number to pharmacist [facility] [phone].

## 2015-05-04 NOTE — Assessment & Plan Note (Signed)
Uses CPAP regularly °

## 2015-05-04 NOTE — Progress Notes (Signed)
Pre visit review using our clinic review tool, if applicable. No additional management support is needed unless otherwise documented below in the visit note. 

## 2015-05-04 NOTE — Assessment & Plan Note (Signed)
Poorly controlled will alter medications, encouraged DASH diet, minimize caffeine and obtain adequate sleep. Report concerning symptoms and follow up as directed and as needed. Add Losartan 25 mg daily 

## 2015-05-04 NOTE — Progress Notes (Signed)
Christina LookJennifer L Mendez  [id number] 1976/04/16 05/04/2015      Progress Note-Follow Up  Subjective  Chief Complaint  Chief Complaint  Patient presents with  . Annual Exam    HPI  Patient is a 39 y.o. female in today for routine medical care. Patient is in today for annual exam. Has been having a lot of trouble with her stomach. She notes some intermittent epigastric pain and nausea. Has dyspepsia and heartburn at times. No bloody or tarry stool. No constipation or diarrhea. No fevers or chills. Does note increased stressors and anxiety recently no acute illness. Denies CP/palp/SOB/HA/congestion/fevers or GU c/o. Taking meds as prescribed  Past Medical History  Diagnosis Date  . Heartburn   . Chicken pox 3rd grade  . Hypertension   . Allergy last couple of years    seasonal  . HTN (hypertension) 12/09/2011  . Obese 12/09/2011  . Sinusitis 01/08/2012  . Cervical cancer screening 03/02/2012  . Allergic state 03/02/2012  . Inflamed skin tag 03/02/2012  . Ovarian cyst 08/17/2012    Past Surgical History  Procedure Laterality Date  . Cyst removed  39 yr old    benign, abdominal    Family History  Problem Relation Age of Onset  . Fibromyalgia Mother   . Diabetes Mother     type 2  . Hypertension Mother   . Diabetes Father     type 2  . Heart disease Father 2423    2 MI  . Hypertension Father   . Heart attack Father 23    X 2  . Stroke Father     mini strokes  . GER disease Father   . Hyperlipidemia Father   . Cholelithiasis Father   . Venous thrombosis Father     in liver  . Hypertension Brother   . Venous thrombosis Brother     on spine  . Cancer Maternal Grandfather     stomach  . Hypertension Paternal Grandmother   . Diabetes Paternal Grandmother     type 2  . Heart attack Paternal Grandmother   . Heart disease Paternal Grandmother   . Heart attack Paternal Grandfather   . Heart disease Paternal Grandfather   . Hypertension Paternal Grandfather   . Cancer  Maternal Uncle     prostate cancer    History   Social History  . Marital Status: Single    Spouse Name: N/A  . Number of Children: N/A  . Years of Education: N/A   Occupational History  . Not on file.   Social History Main Topics  . Smoking status: Never Smoker   . Smokeless tobacco: Never Used  . Alcohol Use: Yes     Comment: OCCASIONALLY  . Drug Use: No  . Sexual Activity:    Partners: Male     Comment: No dietary restrictions, lives with Mom, Dad and brother and niece.  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 Not on file   Social History Narrative    Current Outpatient Prescriptions on File Prior to Visit  Medication Sig Dispense Refill  . metoprolol succinate (TOPROL-XL) 25 MG 24 hr tablet TAKE ONE TABLET BY MOUTH DAILY 90 tablet 0  . montelukast (SINGULAIR) 10 MG tablet Take 1 tablet (10 mg total) by mouth at bedtime. 30 tablet 3  . Multiple Vitamin (MULTIVITAMIN) tablet Take 1 tablet by mouth daily.    . ranitidine (ZANTAC) 300 MG tablet TAKE ONE TABLET BY MOUTH ONE TIME DAILY  30 tablet 5  . TGT ALL DAY ALLERGY RELIEF 10 MG tablet Take one tablet by mouth one time daily 30 tablet 4  . albuterol (PROVENTIL HFA;VENTOLIN HFA) 108 (90 BASE) MCG/ACT inhaler Inhale 2 puffs into the lungs every 6 (six) hours as needed for wheezing or shortness of breath. (Patient not taking: Reported on 05/04/2015) 1 Inhaler 0  . fluticasone (FLONASE) 50 MCG/ACT nasal spray Place 2 sprays into the nose daily as needed for rhinitis or allergies. 16 g 2  . potassium chloride SA (K-DUR,KLOR-CON) 20 MEQ tablet Take 1 tablet (20 mEq total) by mouth daily. 1 tab po daily x 7 days then as needed. (Patient not taking: Reported on 05/04/2015) 30 tablet 0   No current facility-administered medications on file prior to visit.    Allergies  Allergen Reactions  . Skelaxin Hypertension    Review of Systems  Review of Systems  Constitutional: Negative for fever, chills and  malaise/fatigue.  HENT: Negative for congestion, hearing loss and nosebleeds.   Eyes: Negative for discharge.  Respiratory: Negative for cough, sputum production, shortness of breath and wheezing.   Cardiovascular: Negative for chest pain, palpitations and leg swelling.  Gastrointestinal: Positive for heartburn and nausea. Negative for vomiting, abdominal pain, diarrhea, constipation and blood in stool.  Genitourinary: Negative for dysuria, urgency, frequency and hematuria.  Musculoskeletal: Negative for myalgias, back pain and falls.  Skin: Negative for rash.  Neurological: Negative for dizziness, tremors, sensory change, focal weakness, loss of consciousness, weakness and headaches.  Endo/Heme/Allergies: Negative for polydipsia. Does not bruise/bleed easily.  Psychiatric/Behavioral: Negative for depression and suicidal ideas. The patient is nervous/anxious. The patient does not have insomnia.     Objective  BP 149/95 mmHg  Pulse 58  Temp(Src) 98.1 F (36.7 C) (Oral)  Ht  (1.702 m)  Wt 350 lb (158.759 kg)  BMI 54.80 kg/m2  SpO2 98%  LMP 04/24/2015  Physical Exam  Physical Exam  Constitutional: She is oriented to person, place, and time and well-developed, well-nourished, and in no distress. No distress.  HENT:  Head: Normocephalic and atraumatic.  Eyes: Conjunctivae are normal. Pupils are equal, round, and reactive to light. Right eye exhibits no discharge. Left eye exhibits no discharge.  Neck: Neck supple. No thyromegaly present.  Cardiovascular: Normal rate, regular rhythm and normal heart sounds.   No murmur heard. Pulmonary/Chest: Effort normal and breath sounds normal. She has no wheezes.  Abdominal: Soft. Bowel sounds are normal. She exhibits no distension and no mass. There is no tenderness. There is no rebound and no guarding.  Musculoskeletal: She exhibits no edema.  Lymphadenopathy:    She has no cervical adenopathy.  Neurological: She is alert and oriented  to person, place, and time.  Skin: Skin is warm and dry. No rash noted. She is not diaphoretic.  Psychiatric: Memory, affect and judgment normal.    Lab Results  Component Value Date   TSH 0.83 11/20/2014   Lab Results  Component Value Date   WBC 5.3 08/21/2014   HGB 13.6 08/21/2014   HCT 40.5 08/21/2014   MCV 88.7 08/21/2014   PLT 284.0 08/21/2014   Lab Results  Component Value Date   CREATININE 0.8 11/20/2014   BUN 8 11/20/2014   NA 134* 11/20/2014   K 3.6 11/20/2014   CL 104 11/20/2014   CO2 25 11/20/2014   Lab Results  Component Value Date   ALT 16 03/31/2014   AST 17 03/31/2014   ALKPHOS 44 03/31/2014  BILITOT 0.5 03/31/2014   Lab Results  Component Value Date   CHOL 170 08/21/2014   Lab Results  Component Value Date   HDL 40.50 08/21/2014   Lab Results  Component Value Date   LDLCALC 98 08/21/2014   Lab Results  Component Value Date   TRIG 159.0* 08/21/2014   Lab Results  Component Value Date   CHOLHDL 4 08/21/2014     Assessment & Plan  HTN (hypertension) Poorly controlled will alter medications, encouraged DASH diet, minimize caffeine and obtain adequate sleep. Report concerning symptoms and follow up as directed and as needed. Add Losartan 25 mg daily   Allergic rhinitis Still struggles with congestion on Clartin and flonase, and Singulair. Consider nasal saline prn and increasing Claritin to bid on bad days   Obese Encouraged DASH diet, decrease po intake and increase exercise as tolerated. Needs 7-8 hours of sleep nightly. Avoid trans fats, eat small, frequent meals every 4-5 hours with lean proteins, complex carbs and healthy fats. Minimize simple carbs, GMO foods.   Heartburn Worsening despite Ranitidine, add Omeprazole, continue probiotic, check H Pylori today and referred to gastroenterology   OSA (obstructive sleep apnea) Uses CPAP regularly   Hyperlipidemia, mixed Encouraged heart healthy diet, increase exercise, avoid  trans fats, consider a krill oil cap daily   Preventative health care Patient encouraged to maintain heart healthy diet, regular exercise, adequate sleep. Consider daily probiotics. Take medications as prescribed. Annual labs ordered and reviewed

## 2015-05-04 NOTE — Telephone Encounter (Signed)
She has had Zyrtec in the past I assume that is what she wants. Not sure how it got switched. 10 mg po qd prn allergies.

## 2015-05-04 NOTE — Assessment & Plan Note (Signed)
Encouraged DASH diet, decrease po intake and increase exercise as tolerated. Needs 7-8 hours of sleep nightly. Avoid trans fats, eat small, frequent meals every 4-5 hours with lean proteins, complex carbs and healthy fats. Minimize simple carbs, GMO foods. 

## 2015-05-04 NOTE — Patient Instructions (Signed)
Probiotic by [employer], 10 strain version, order at [email or web address]  Food Choices for Gastroesophageal Reflux Disease When you have gastroesophageal reflux disease (GERD), the foods you eat and your eating habits are very important. Choosing the right foods can help ease your discomfort.  WHAT GUIDELINES DO I NEED TO FOLLOW?   Choose fruits, vegetables, whole grains, and low-fat dairy products.   Choose low-fat meat, fish, and poultry.  Limit fats such as oils, salad dressings, butter, nuts, and avocado.   Keep a food diary. This helps you identify foods that cause symptoms.   Avoid foods that cause symptoms. These may be different for everyone.   Eat small meals often instead of 3 large meals a day.   Eat your meals slowly, in a place where you are relaxed.   Limit fried foods.   Cook foods using methods other than frying.   Avoid drinking alcohol.   Avoid drinking large amounts of liquids with your meals.   Avoid bending over or lying down until 2-3 hours after eating.  WHAT FOODS ARE NOT RECOMMENDED?  These are some foods and drinks that may make your symptoms worse: Vegetables Tomatoes. Tomato juice. Tomato and spaghetti sauce. Chili peppers. Onion and garlic. Horseradish. Fruits Oranges, grapefruit, and lemon (fruit and juice). Meats High-fat meats, fish, and poultry. This includes hot dogs, ribs, ham, sausage, salami, and bacon. Dairy Whole milk and chocolate milk. Sour cream. Cream. Butter. Ice cream. Cream cheese.  Drinks Coffee and tea. Bubbly (carbonated) drinks or energy drinks. Condiments Hot sauce. Barbecue sauce.  Sweets/Desserts Chocolate and cocoa. Donuts. Peppermint and spearmint. Fats and Oils High-fat foods. This includes [place] fries and potato chips. Other Vinegar. Strong spices. This includes black pepper, white pepper, red pepper, cayenne, curry powder, cloves, ginger, and chili powder. The items listed above may not be a complete  list of foods and drinks to avoid. Contact your dietitian for more information. Document Released: 05/29/2012 Document Revised: 12/03/2013 Document Reviewed: 10/02/2013 [facility] Patient Information 2015 [employer] [place]. This information is not intended to replace advice given to you by your health care provider. Make sure you discuss any questions you have with your health care provider.

## 2015-05-04 NOTE — Assessment & Plan Note (Signed)
Still struggles with congestion on Clartin and flonase, and Singulair. Consider nasal saline prn and increasing Claritin to bid on bad days

## 2015-05-04 NOTE — Assessment & Plan Note (Signed)
Worsening despite Ranitidine, add Omeprazole, continue probiotic, check H Pylori today and referred to gastroenterology

## 2015-05-05 NOTE — Telephone Encounter (Signed)
Pharmacist informed Zyrtec

## 2015-05-11 NOTE — Assessment & Plan Note (Signed)
Encouraged heart healthy diet, increase exercise, avoid trans fats, consider a krill oil cap daily 

## 2015-05-11 NOTE — Assessment & Plan Note (Signed)
Patient encouraged to maintain heart healthy diet, regular exercise, adequate sleep. Consider daily probiotics. Take medications as prescribed. Annual labs ordered and reviewed.  

## 2015-07-03 ENCOUNTER — Ambulatory Visit: Payer: BLUE CROSS/BLUE SHIELD | Admitting: Internal Medicine

## 2015-07-10 ENCOUNTER — Ambulatory Visit: Payer: BLUE CROSS/BLUE SHIELD | Admitting: Gastroenterology

## 2015-07-28 ENCOUNTER — Encounter: Payer: Self-pay | Admitting: Gastroenterology

## 2015-07-28 ENCOUNTER — Ambulatory Visit (INDEPENDENT_AMBULATORY_CARE_PROVIDER_SITE_OTHER): Payer: BLUE CROSS/BLUE SHIELD | Admitting: Gastroenterology

## 2015-07-28 VITALS — BP 126/88 | HR 76 | Ht 69.0 in | Wt 360.0 lb

## 2015-07-28 DIAGNOSIS — R112 Nausea with vomiting, unspecified: Secondary | ICD-10-CM

## 2015-07-28 DIAGNOSIS — K219 Gastro-esophageal reflux disease without esophagitis: Secondary | ICD-10-CM | POA: Diagnosis not present

## 2015-07-28 DIAGNOSIS — R1084 Generalized abdominal pain: Secondary | ICD-10-CM

## 2015-07-28 MED ORDER — HYOSCYAMINE SULFATE 0.125 MG SL SUBL: 0.1250 mg | SUBLINGUAL_TABLET | Freq: Three times a day (TID) | SUBLINGUAL | Status: DC | PRN

## 2015-07-28 NOTE — Patient Instructions (Addendum)
We have sent the following medications to your pharmacy for you to pick up at your convenience: Levsin  Please follow up in 6 weeks. We will contact you with an appt.

## 2015-07-28 NOTE — Progress Notes (Signed)
07/28/2015 [name] [id number] September 07, 1976   HISTORY OF PRESENT ILLNESS:   This is a pleasant 39 year old female who is new to our office. She was referred here by her PCP, Dr. Rogelia Rohrer, for evaluation of nausea and some abdominal pains. She has reflux for the past 3 years or so has been on omeprazole 40 mg daily, which she says controls her symptoms well. Recently, however, over the past few months she's been having intermittent nausea with occasional vomiting. She says that the nausea comes in waves. She also has some intermittent mild abdominal cramping but says that her bowel habits are normal/regular. She denies seeing blood in her stool.  She admits that she is under a lot of stress with her current job and is actually resigning from that job today. She does have another job already lined up and is hoping that it will be much better for her. Recent CBC, TSH, free T4 , CMP , an H. Pylori IgG antibody were all negative/normal in 04/2015.   Past Medical History  Diagnosis Date  . Heartburn   . Chicken pox 3rd grade  . Hypertension   . Allergy last couple of years    seasonal  . HTN (hypertension) 12/09/2011  . Obese 12/09/2011  . Sinusitis 01/08/2012  . Cervical cancer screening 03/02/2012  . Allergic state 03/02/2012  . Inflamed skin tag 03/02/2012  . Ovarian cyst 08/17/2012  . Menorrhagia 05/04/2015  . GERD (gastroesophageal reflux disease)   . Hyperlipidemia   . OSA (obstructive sleep apnea)    Past Surgical History  Procedure Laterality Date  . Cyst removed  39 yr old    benign, abdominal    reports that she has never smoked. She has never used smokeless tobacco. She reports that she drinks alcohol. She reports that she does not use illicit drugs. family history includes Cancer in her maternal grandfather and maternal uncle; Cholelithiasis in her father; Diabetes in her father, mother, and paternal grandmother; Fibromyalgia in her mother; GER disease in her father; Heart  attack in her paternal grandfather and paternal grandmother; Heart attack (age of onset: 98) in her father; Heart disease in her paternal grandfather and paternal grandmother; Heart disease (age of onset: 80) in her father; Hyperlipidemia in her father; Hypertension in her brother, father, mother, paternal grandfather, and paternal grandmother; Stroke in her father; Venous thrombosis in her brother and father. Allergies  Allergen Reactions  . Skelaxin Hypertension      Outpatient Encounter Prescriptions as of 07/28/2015  Medication Sig  . albuterol (PROVENTIL HFA;VENTOLIN HFA) 108 (90 BASE) MCG/ACT inhaler Inhale 2 puffs into the lungs every 6 (six) hours as needed for wheezing or shortness of breath.  . losartan (COZAAR) 25 MG tablet Take 1 tablet (25 mg total) by mouth daily.  . metoprolol succinate (TOPROL-XL) 25 MG 24 hr tablet TAKE ONE TABLET BY MOUTH ONE TIME DAILY  . montelukast (SINGULAIR) 10 MG tablet TAKE ONE TABLET BY MOUTH AT BEDTIME  . omeprazole (PRILOSEC) 40 MG capsule Take 1 capsule (40 mg total) by mouth daily.  . TGT ALL DAY ALLERGY RELIEF 10 MG tablet Take one tablet by mouth one time daily  . fluticasone (FLONASE) 50 MCG/ACT nasal spray Place 2 sprays into the nose daily as needed for rhinitis or allergies.  . hyoscyamine (LEVSIN SL) 0.125 MG SL tablet Place 1 tablet (0.125 mg total) under the tongue 3 (three) times daily as needed.  . [DISCONTINUED] Multiple Vitamin (MULTIVITAMIN) tablet Take 1  tablet by mouth daily.  . [DISCONTINUED] potassium chloride SA (K-DUR,KLOR-CON) 20 MEQ tablet Take 1 tablet (20 mEq total) by mouth daily. 1 tab po daily x 7 days then as needed. (Patient not taking: Reported on 05/04/2015)  . [DISCONTINUED] ranitidine (ZANTAC) 300 MG tablet TAKE ONE TABLET BY MOUTH ONE TIME DAILY   . [DISCONTINUED] TGT ALL DAY ALLERGY RELIEF 10 MG tablet TAKE ONE TABLET BY MOUTH ONE TIME DAILY   No facility-administered encounter medications on file as of 07/28/2015.       REVIEW OF SYSTEMS  : All other systems reviewed and negative except where noted in the History of Present Illness.   PHYSICAL EXAM: BP 126/88 mmHg  Pulse 76  Ht 5\' 9"  (1.753 m)  Wt 360 lb (163.295 kg)  BMI 53.14 kg/m2 General: Well developed black female in no acute distress Head: Normocephalic and atraumatic Eyes:  Sclerae anicteric, conjunctiva pink. Ears: Normal auditory acuity Lungs: Clear throughout to auscultation Heart: Regular rate and rhythm Abdomen: Soft, non-distended.  Normal bowel sounds.  Non-tender. Musculoskeletal: Symmetrical with no gross deformities  Skin: No lesions on visible extremities Extremities: No edema  Neurological: Alert oriented x 4, grossly non-focal Psychological:  Alert and cooperative. Normal mood and affect  ASSESSMENT AND PLAN: -Intermittent nausea and vomiting:  Just occurring recently.  Under a lot of stress with her current job, which she is actually resigning today.  ? If this is related to stress/IBS.  Will just observe for now, but if continues then could consider EGD at follow-up visit in 6 weeks. -GERD:  Under good control with omeprazole 40 mg daily. -Abdominal pain:  Intermittent crampy abdominal pain.  No bowel issues or bleeding.  ? If this is also some IBS type symptoms.  Will observe for now and try levsin prn.    CC:  Bradd Canary, MD

## 2015-07-29 NOTE — Progress Notes (Signed)
i agree with the above note, plan 

## 2015-08-11 ENCOUNTER — Ambulatory Visit: Payer: BLUE CROSS/BLUE SHIELD | Admitting: Family Medicine

## 2015-09-08 ENCOUNTER — Encounter: Payer: Self-pay | Admitting: Gastroenterology

## 2015-09-08 ENCOUNTER — Ambulatory Visit (INDEPENDENT_AMBULATORY_CARE_PROVIDER_SITE_OTHER): Payer: BLUE CROSS/BLUE SHIELD | Admitting: Gastroenterology

## 2015-09-08 ENCOUNTER — Ambulatory Visit: Payer: BLUE CROSS/BLUE SHIELD | Admitting: Gastroenterology

## 2015-09-08 VITALS — BP 120/92 | HR 60 | Ht 67.75 in | Wt 348.5 lb

## 2015-09-08 DIAGNOSIS — R112 Nausea with vomiting, unspecified: Secondary | ICD-10-CM | POA: Diagnosis not present

## 2015-09-08 DIAGNOSIS — R1084 Generalized abdominal pain: Secondary | ICD-10-CM | POA: Diagnosis not present

## 2015-09-08 DIAGNOSIS — R12 Heartburn: Secondary | ICD-10-CM | POA: Diagnosis not present

## 2015-09-08 NOTE — Patient Instructions (Signed)
Follow up as needed

## 2015-09-08 NOTE — Progress Notes (Signed)
Glad she is feeling better.  I agree with the above note, plan °

## 2015-09-08 NOTE — Progress Notes (Signed)
     09/08/2015 Christina Mendez [id number] 1976-07-29   History of Present Illness:  Patient is a pleasant 39 year old female who is here for follow-up of nausea, vomiting, and generalized abdominal pain.  She says that she feels much better; no longer having any symptoms since terminating her previous job.  She got the levsin but has not needed to use it.  She feels great.  Is eating much better and exercising diligently every day.  Has lost almost 12 pounds.   Current Medications, Allergies, Past Medical History, Past Surgical History, Family History and Social History were reviewed in [employer] record.   Physical Exam: BP 120/92 mmHg  Pulse 60  Ht 5' 7.75" (1.721 m)  Wt 348 lb 8 oz (158.079 kg)  BMI 53.37 kg/m2  LMP 09/01/2015 General: Well developed black female in no acute distress Head: Normocephalic and atraumatic Eyes:  Sclerae anicteric, conjunctiva pink  Ears: Normal auditory acuity Lungs: Clear throughout to auscultation Heart: Regular rate and rhythm Abdomen: Soft, non-distended.  Normal bowel sounds.  Non-tender. Musculoskeletal: Symmetrical with no gross deformities  Extremities: No edema  Neurological: Alert oriented x 4, grossly non-focal Psychological:  Alert and cooperative. Normal mood and affect  Assessment and Recommendations: -Nausea with vomiting and intermittent abdominal pain:  Likely IBS.  Symptoms completely resolved with leaving her previous job. -GERD:  Well-controlled on omeprazole 40 mg daily.  *Follow-up prn for any recurrent symptoms.

## 2015-10-26 ENCOUNTER — Other Ambulatory Visit: Payer: Self-pay | Admitting: Family Medicine

## 2015-11-04 ENCOUNTER — Other Ambulatory Visit: Payer: Self-pay | Admitting: Family Medicine

## 2015-11-04 NOTE — Telephone Encounter (Signed)
Caller name: Victorino DikeJennifer  Relationship to patient: Self   Can be reached: [phone]  [facility]   Reason for call: Pt is requesting a refill on 2 medications. losartan, omeprazole- informed pt that its showing that there is 1 remaining refill on this Rx. Pt will call pharmacy to check. Pt says that her usual pharmacy prices has gone up and she would [facility]

## 2015-11-04 NOTE — Telephone Encounter (Signed)
[facility] has been deleted from pharmacy contact and [facility] entered.

## 2015-11-04 NOTE — Telephone Encounter (Signed)
Pt will get omeprazole & losartan from [facility] this time as they already filled them. She wants us to change pharmacy to [facility] in the future bc it's less expensive

## 2015-11-26 ENCOUNTER — Telehealth: Payer: Self-pay | Admitting: Family Medicine

## 2015-11-26 DIAGNOSIS — Z0184 Encounter for antibody response examination: Secondary | ICD-10-CM

## 2015-11-26 NOTE — Telephone Encounter (Signed)
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 She needs to have the Hep B, MMR and TB Test.

## 2015-11-27 ENCOUNTER — Other Ambulatory Visit (INDEPENDENT_AMBULATORY_CARE_PROVIDER_SITE_OTHER): Payer: BLUE CROSS/BLUE SHIELD

## 2015-11-27 ENCOUNTER — Ambulatory Visit (INDEPENDENT_AMBULATORY_CARE_PROVIDER_SITE_OTHER): Payer: BLUE CROSS/BLUE SHIELD | Admitting: Behavioral Health

## 2015-11-27 DIAGNOSIS — Z111 Encounter for screening for respiratory tuberculosis: Secondary | ICD-10-CM | POA: Diagnosis not present

## 2015-11-27 DIAGNOSIS — Z0184 Encounter for antibody response examination: Secondary | ICD-10-CM

## 2015-11-27 NOTE — Progress Notes (Signed)
Pre visit review using our clinic review tool, if applicable. No additional management support is needed unless otherwise documented below in the visit note. 

## 2015-11-27 NOTE — Telephone Encounter (Signed)
She will need to bring in the form. I believe I have seen her for a PE in past year so I will complete but she needs nurse visit for ppd preferably today and read it Monday so we can have it done before Wednesday. Also OK to check labs for MMR nd hep immunity if she thinks she has had all the shots or we can boost the shots. 

## 2015-11-27 NOTE — Telephone Encounter (Signed)
Patient informed and did schedule nurse visit today at 4 for PPD and scheduled lab appt. Today as well.

## 2015-11-28 LAB — HEPATITIS B SURFACE ANTIBODY,QUALITATIVE: HEP B S AB: NEGATIVE

## 2015-11-30 ENCOUNTER — Ambulatory Visit (INDEPENDENT_AMBULATORY_CARE_PROVIDER_SITE_OTHER): Payer: BLUE CROSS/BLUE SHIELD

## 2015-11-30 ENCOUNTER — Telehealth: Payer: Self-pay | Admitting: Family Medicine

## 2015-11-30 DIAGNOSIS — Z23 Encounter for immunization: Secondary | ICD-10-CM | POA: Diagnosis not present

## 2015-11-30 LAB — MEASLES/MUMPS/RUBELLA IMMUNITY
Mumps IgG: >300 AU/mL — ABNORMAL HIGH (ref <9.00)
Rubella: 6.83 Index — ABNORMAL HIGH (ref <0.90)
Rubeola IgG: 140 AU/mL — ABNORMAL HIGH (ref <25.00)

## 2015-11-30 LAB — TB SKIN TEST
INDURATION: 0 mm
TB SKIN TEST: NEGATIVE

## 2015-11-30 NOTE — Telephone Encounter (Signed)
Patient informed of lab results. 

## 2015-11-30 NOTE — Progress Notes (Signed)
Pre visit review using our clinic review tool, if applicable. No additional management support is needed unless otherwise documented below in the visit note.  Pt here for TB skin test and Hep B vaccine.  TB skin test: negative.    Hep B Vaccine:  1 dose given today.  Pt tolerated well.  Next scheduled:  01/01/16.

## 2015-11-30 NOTE — Telephone Encounter (Signed)
Pt says that she is returning your call.   She says that she is coming in the office today to have her tb test read.   CB: [phone]

## 2015-12-01 NOTE — Telephone Encounter (Signed)
Form completed, copied and sent to scan. Patient informed to pickup original at our front desk at her convenience.

## 2016-01-01 ENCOUNTER — Ambulatory Visit (INDEPENDENT_AMBULATORY_CARE_PROVIDER_SITE_OTHER): Payer: BLUE CROSS/BLUE SHIELD | Admitting: *Deleted

## 2016-01-01 DIAGNOSIS — Z23 Encounter for immunization: Secondary | ICD-10-CM

## 2016-01-01 NOTE — Progress Notes (Signed)
Pre visit review using our clinic review tool, if applicable. No additional management support is needed unless otherwise documented below in the visit note.  Pt in for 2nd Hep B vaccine. Pt tolerated injection well. No s/s of a reaction upon leaving the clinic.   Next injection scheduled for 05/31/2016.

## 2016-01-26 ENCOUNTER — Encounter (HOSPITAL_COMMUNITY): Payer: Self-pay | Admitting: Emergency Medicine

## 2016-01-26 ENCOUNTER — Emergency Department (INDEPENDENT_AMBULATORY_CARE_PROVIDER_SITE_OTHER)
Admission: EM | Admit: 2016-01-26 | Discharge: 2016-01-26 | Disposition: A | Discharge status: Home / Self Care | Payer: BLUE CROSS/BLUE SHIELD | Source: Home / Self Care | Attending: Emergency Medicine | Admitting: Emergency Medicine

## 2016-01-26 DIAGNOSIS — W57XXXA Bitten or stung by nonvenomous insect and other nonvenomous arthropods, initial encounter: Secondary | ICD-10-CM

## 2016-01-26 DIAGNOSIS — T148 Other injury of unspecified body region: Secondary | ICD-10-CM | POA: Diagnosis not present

## 2016-01-26 NOTE — ED Provider Notes (Signed)
CSN: [id number]     Arrival date & time 01/26/16  1820 History   First MD Initiated Contact with Patient 01/26/16 1920     Chief Complaint  Patient presents with  . Rash   (Consider location/radiation/quality/duration/timing/severity/associated sxs/prior Treatment) HPI  She is a 40 year old woman here for evaluation of rash. She states that 2 days ago she woke up and she had 5 red bumps on her left upper arm. She states they do not itch or hurt. She states they have gotten smaller and less red over the last 2 days. She is not using any medications on them. She denies any fevers. She did change her sheets.  Past Medical History  Diagnosis Date  . Heartburn   . Chicken pox 3rd grade  . Hypertension   . Allergy last couple of years    seasonal  . HTN (hypertension) 12/09/2011  . Obese 12/09/2011  . Sinusitis 01/08/2012  . Cervical cancer screening 03/02/2012  . Allergic state 03/02/2012  . Inflamed skin tag 03/02/2012  . Ovarian cyst 08/17/2012  . Menorrhagia 05/04/2015  . GERD (gastroesophageal reflux disease)   . Hyperlipidemia   . OSA (obstructive sleep apnea)    Past Surgical History  Procedure Laterality Date  . Cyst removed  40 yr old    benign, abdominal   Family History  Problem Relation Age of Onset  . Fibromyalgia Mother   . Diabetes Mother     type 2  . Hypertension Mother   . Diabetes Father     type 2  . Heart disease Father 74    2 MI  . Hypertension Father   . Heart attack Father 23    X 2  . Stroke Father     mini strokes  . GER disease Father   . Hyperlipidemia Father   . Cholelithiasis Father   . Venous thrombosis Father     in liver  . Hypertension Brother   . Venous thrombosis Brother     on spine  . Cancer Maternal Grandfather     stomach  . Hypertension Paternal Grandmother   . Diabetes Paternal Grandmother     type 2  . Heart attack Paternal Grandmother   . Heart disease Paternal Grandmother   . Heart attack Paternal Grandfather   . Heart  disease Paternal Grandfather   . Hypertension Paternal Grandfather   . Cancer Maternal Uncle     prostate cancer   Social History  Substance Use Topics  . Smoking status: Never Smoker   . Smokeless tobacco: Never Used  . Alcohol Use: Yes     Comment: OCCASIONALLY   OB History    No data available     Review of Systems As in history of present illness Allergies  Skelaxin  Home Medications   Prior to Admission medications   Medication Sig Start Date End Date Taking? Authorizing Provider  albuterol (PROVENTIL HFA;VENTOLIN HFA) 108 (90 BASE) MCG/ACT inhaler Inhale 2 puffs into the lungs every 6 (six) hours as needed for wheezing or shortness of breath. 09/05/14   Ramon Dredge Saguier, PA-C  fluticasone (FLONASE) 50 MCG/ACT nasal spray Place 2 sprays into the nose daily as needed for rhinitis or allergies. 08/17/12 09/08/15  Bradd Canary, MD  hyoscyamine (LEVSIN SL) 0.125 MG SL tablet Place 1 tablet (0.125 mg total) under the tongue 3 (three) times daily as needed. 07/28/15   Jessica D Zehr, PA-C  losartan (COZAAR) 25 MG tablet TAKE 1 TABLET (25 MG TOTAL) BY  MOUTH DAILY. 10/26/15   Bradd Canary, MD  metoprolol succinate (TOPROL-XL) 25 MG 24 hr tablet TAKE ONE TABLET BY MOUTH ONE TIME DAILY 05/04/15   Bradd Canary, MD  montelukast (SINGULAIR) 10 MG tablet TAKE ONE TABLET BY MOUTH AT BEDTIME 05/04/15   Bradd Canary, MD  omeprazole (PRILOSEC) 40 MG capsule TAKE 1 CAPSULE (40 MG TOTAL) BY MOUTH DAILY. 10/26/15   Bradd Canary, MD  TGT ALL DAY ALLERGY RELIEF 10 MG tablet Take one tablet by mouth one time daily 02/17/14   Bradd Canary, MD   Meds Ordered and Administered this Visit  Medications - No data to display  BP 150/78 mmHg  Pulse 58  Temp(Src) 98 F (36.7 C) (Oral)  Resp 16  SpO2 100%  LMP 01/10/2016 No data found.   Physical Exam  Constitutional: She is oriented to person, place, and time. She appears well-developed and well-nourished. No distress.  Cardiovascular: Normal  rate.   Pulmonary/Chest: Effort normal.  Neurological: She is alert and oriented to person, place, and time.  Skin:  She has 5 2 mm erythematous papules on her left upper arm. No vesicular rash. No sign of infection.    ED Course  Procedures (including critical care time)  Labs Review Labs Reviewed - No data to display  Imaging Review No results found.   MDM   1. Insect bite    These are likely some kind of insect bite. Recommended OTC hydrocortisone cream as needed. Follow-up as needed.    Charm Rings, MD 01/26/16 [phone]

## 2016-01-26 NOTE — ED Notes (Signed)
Insect bites to left upper arm

## 2016-01-26 NOTE — Discharge Instructions (Signed)
It looks like something bit you. You can use some over-the-counter hydrocortisone cream if you need to for itching or discomfort. This should resolve over the next few days. Follow-up as needed.

## 2016-03-06 ENCOUNTER — Other Ambulatory Visit: Payer: Self-pay | Admitting: Family Medicine

## 2016-05-01 ENCOUNTER — Other Ambulatory Visit: Payer: Self-pay | Admitting: Family Medicine

## 2016-05-05 ENCOUNTER — Telehealth: Payer: Self-pay | Admitting: Behavioral Health

## 2016-05-05 NOTE — Telephone Encounter (Signed)
Unable to reach patient at time of Pre-Visit Call.  Left message for patient to return call when available.    

## 2016-05-06 ENCOUNTER — Ambulatory Visit (INDEPENDENT_AMBULATORY_CARE_PROVIDER_SITE_OTHER): Payer: Self-pay | Admitting: Family Medicine

## 2016-05-06 ENCOUNTER — Encounter: Payer: Self-pay | Admitting: Family Medicine

## 2016-05-06 VITALS — BP 122/70 | HR 66 | Temp 98.3°F | Ht 68.5 in | Wt 346.0 lb

## 2016-05-06 DIAGNOSIS — I10 Essential (primary) hypertension: Secondary | ICD-10-CM

## 2016-05-06 DIAGNOSIS — E871 Hypo-osmolality and hyponatremia: Secondary | ICD-10-CM

## 2016-05-06 DIAGNOSIS — E669 Obesity, unspecified: Secondary | ICD-10-CM

## 2016-05-06 DIAGNOSIS — E782 Mixed hyperlipidemia: Secondary | ICD-10-CM

## 2016-05-06 DIAGNOSIS — Z0000 Encounter for general adult medical examination without abnormal findings: Secondary | ICD-10-CM

## 2016-05-06 DIAGNOSIS — R12 Heartburn: Secondary | ICD-10-CM

## 2016-05-06 NOTE — Progress Notes (Signed)
Pre visit review using our clinic review tool, if applicable. No additional management support is needed unless otherwise documented below in the visit note. 

## 2016-05-06 NOTE — Patient Instructions (Signed)
Preventive Care for Adults, Female A healthy lifestyle and preventive care can promote health and wellness. Preventive health guidelines for women include the following key practices.  A routine yearly physical is a good way to check with your health care provider about your health and preventive screening. It is a chance to share any concerns and updates on your health and to receive a thorough exam.  Visit your dentist for a routine exam and preventive care every 6 months. Brush your teeth twice a day and floss once a day. Good oral hygiene prevents tooth decay and gum disease.  The frequency of eye exams is based on your age, health, family medical history, use of contact lenses, and other factors. Follow your health care provider's recommendations for frequency of eye exams.  Eat a healthy diet. Foods like vegetables, fruits, whole grains, low-fat dairy products, and lean protein foods contain the nutrients you need without too many calories. Decrease your intake of foods high in solid fats, added sugars, and salt. Eat the right amount of calories for you.Get information about a proper diet from your health care provider, if necessary.  Regular physical exercise is one of the most important things you can do for your health. Most adults should get at least 150 minutes of moderate-intensity exercise (any activity that increases your heart rate and causes you to sweat) each week. In addition, most adults need muscle-strengthening exercises on 2 or more days a week.  Maintain a healthy weight. The body mass index (BMI) is a screening tool to identify possible weight problems. It provides an estimate of body fat based on height and weight. Your health care provider can find your BMI and can help you achieve or maintain a healthy weight.For adults 20 years and older:  A BMI below 18.5 is considered underweight.  A BMI of 18.5 to 24.9 is normal.  A BMI of 25 to 29.9 is considered overweight.  A  BMI of 30 and above is considered obese.  Maintain normal blood lipids and cholesterol levels by exercising and minimizing your intake of saturated fat. Eat a balanced diet with plenty of fruit and vegetables. Blood tests for lipids and cholesterol should begin at age 45 and be repeated every 5 years. If your lipid or cholesterol levels are high, you are over 50, or you are at high risk for heart disease, you may need your cholesterol levels checked more frequently.Ongoing high lipid and cholesterol levels should be treated with medicines if diet and exercise are not working.  If you smoke, find out from your health care provider how to quit. If you do not use tobacco, do not start.  Lung cancer screening is recommended for adults aged 45-80 years who are at high risk for developing lung cancer because of a history of smoking. A yearly low-dose CT scan of the lungs is recommended for people who have at least a 30-pack-year history of smoking and are a current smoker or have quit within the past 15 years. A pack year of smoking is smoking an average of 1 pack of cigarettes a day for 1 year (for example: 1 pack a day for 30 years or 2 packs a day for 15 years). Yearly screening should continue until the smoker has stopped smoking for at least 15 years. Yearly screening should be stopped for people who develop a health problem that would prevent them from having lung cancer treatment.  If you are pregnant, do not drink alcohol. If you are  breastfeeding, be very cautious about drinking alcohol. If you are not pregnant and choose to drink alcohol, do not have more than 1 drink per day. One drink is considered to be 12 ounces (355 mL) of beer, 5 ounces (148 mL) of wine, or 1.5 ounces (44 mL) of liquor.  Avoid use of street drugs. Do not share needles with anyone. Ask for help if you need support or instructions about stopping the use of drugs.  High blood pressure causes heart disease and increases the risk  of stroke. Your blood pressure should be checked at least every 1 to 2 years. Ongoing high blood pressure should be treated with medicines if weight loss and exercise do not work.  If you are 55-79 years old, ask your health care provider if you should take aspirin to prevent strokes.  Diabetes screening is done by taking a blood sample to check your blood glucose level after you have not eaten for a certain period of time (fasting). If you are not overweight and you do not have risk factors for diabetes, you should be screened once every 3 years starting at age 45. If you are overweight or obese and you are 40-70 years of age, you should be screened for diabetes every year as part of your cardiovascular risk assessment.  Breast cancer screening is essential preventive care for women. You should practice "breast self-awareness." This means understanding the normal appearance and feel of your breasts and may include breast self-examination. Any changes detected, no matter how small, should be reported to a health care provider. Women in their 20s and 30s should have a clinical breast exam (CBE) by a health care provider as part of a regular health exam every 1 to 3 years. After age 40, women should have a CBE every year. Starting at age 40, women should consider having a mammogram (breast X-ray test) every year. Women who have a family history of breast cancer should talk to their health care provider about genetic screening. Women at a high risk of breast cancer should talk to their health care providers about having an MRI and a mammogram every year.  Breast cancer gene (BRCA)-related cancer risk assessment is recommended for women who have family members with BRCA-related cancers. BRCA-related cancers include breast, ovarian, tubal, and peritoneal cancers. Having family members with these cancers may be associated with an increased risk for harmful changes (mutations) in the breast cancer genes BRCA1 and  BRCA2. Results of the assessment will determine the need for genetic counseling and BRCA1 and BRCA2 testing.  Your health care provider may recommend that you be screened regularly for cancer of the pelvic organs (ovaries, uterus, and vagina). This screening involves a pelvic examination, including checking for microscopic changes to the surface of your cervix (Pap test). You may be encouraged to have this screening done every 3 years, beginning at age 21.  For women ages 30-65, health care providers may recommend pelvic exams and Pap testing every 3 years, or they may recommend the Pap and pelvic exam, combined with testing for human papilloma virus (HPV), every 5 years. Some types of HPV increase your risk of cervical cancer. Testing for HPV may also be done on women of any age with unclear Pap test results.  Other health care providers may not recommend any screening for nonpregnant women who are considered low risk for pelvic cancer and who do not have symptoms. Ask your health care provider if a screening pelvic exam is right for   you.  If you have had past treatment for cervical cancer or a condition that could lead to cancer, you need Pap tests and screening for cancer for at least 20 years after your treatment. If Pap tests have been discontinued, your risk factors (such as having a new sexual partner) need to be reassessed to determine if screening should resume. Some women have medical problems that increase the chance of getting cervical cancer. In these cases, your health care provider may recommend more frequent screening and Pap tests.  Colorectal cancer can be detected and often prevented. Most routine colorectal cancer screening begins at the age of 50 years and continues through age 75 years. However, your health care provider may recommend screening at an earlier age if you have risk factors for colon cancer. On a yearly basis, your health care provider may provide home test kits to check  for hidden blood in the stool. Use of a small camera at the end of a tube, to directly examine the colon (sigmoidoscopy or colonoscopy), can detect the earliest forms of colorectal cancer. Talk to your health care provider about this at age 50, when routine screening begins. Direct exam of the colon should be repeated every 5-10 years through age 75 years, unless early forms of precancerous polyps or small growths are found.  People who are at an increased risk for hepatitis B should be screened for this virus. You are considered at high risk for hepatitis B if:  You were born in a country where hepatitis B occurs often. Talk with your health care provider about which countries are considered high risk.  Your parents were born in a high-risk country and you have not received a shot to protect against hepatitis B (hepatitis B vaccine).  You have HIV or AIDS.  You use needles to inject street drugs.  You live with, or have sex with, someone who has hepatitis B.  You get hemodialysis treatment.  You take certain medicines for conditions like cancer, organ transplantation, and autoimmune conditions.  Hepatitis C blood testing is recommended for all people born from 1945 through 1965 and any individual with known risks for hepatitis C.  Practice safe sex. Use condoms and avoid high-risk sexual practices to reduce the spread of sexually transmitted infections (STIs). STIs include gonorrhea, chlamydia, syphilis, trichomonas, herpes, HPV, and human immunodeficiency virus (HIV). Herpes, HIV, and HPV are viral illnesses that have no cure. They can result in disability, cancer, and death.  You should be screened for sexually transmitted illnesses (STIs) including gonorrhea and chlamydia if:  You are sexually active and are younger than 24 years.  You are older than 24 years and your health care provider tells you that you are at risk for this type of infection.  Your sexual activity has changed  since you were last screened and you are at an increased risk for chlamydia or gonorrhea. Ask your health care provider if you are at risk.  If you are at risk of being infected with HIV, it is recommended that you take a prescription medicine daily to prevent HIV infection. This is called preexposure prophylaxis (PrEP). You are considered at risk if:  You are sexually active and do not regularly use condoms or know the HIV status of your partner(s).  You take drugs by injection.  You are sexually active with a partner who has HIV.  Talk with your health care provider about whether you are at high risk of being infected with HIV. If   you choose to begin PrEP, you should first be tested for HIV. You should then be tested every 3 months for as long as you are taking PrEP.  Osteoporosis is a disease in which the bones lose minerals and strength with aging. This can result in serious bone fractures or breaks. The risk of osteoporosis can be identified using a bone density scan. Women ages 67 years and over and women at risk for fractures or osteoporosis should discuss screening with their health care providers. Ask your health care provider whether you should take a calcium supplement or vitamin D to reduce the rate of osteoporosis.  Menopause can be associated with physical symptoms and risks. Hormone replacement therapy is available to decrease symptoms and risks. You should talk to your health care provider about whether hormone replacement therapy is right for you.  Use sunscreen. Apply sunscreen liberally and repeatedly throughout the day. You should seek shade when your shadow is shorter than you. Protect yourself by wearing long sleeves, pants, a wide-brimmed hat, and sunglasses year round, whenever you are outdoors.  Once a month, do a whole body skin exam, using a mirror to look at the skin on your back. Tell your health care provider of new moles, moles that have irregular borders, moles that  are larger than a pencil eraser, or moles that have changed in shape or color.  Stay current with required vaccines (immunizations).  Influenza vaccine. All adults should be immunized every year.  Tetanus, diphtheria, and acellular pertussis (Td, Tdap) vaccine. Pregnant women should receive 1 dose of Tdap vaccine during each pregnancy. The dose should be obtained regardless of the length of time since the last dose. Immunization is preferred during the 27th-36th week of gestation. An adult who has not previously received Tdap or who does not know her vaccine status should receive 1 dose of Tdap. This initial dose should be followed by tetanus and diphtheria toxoids (Td) booster doses every 10 years. Adults with an unknown or incomplete history of completing a 3-dose immunization series with Td-containing vaccines should begin or complete a primary immunization series including a Tdap dose. Adults should receive a Td booster every 10 years.  Varicella vaccine. An adult without evidence of immunity to varicella should receive 2 doses or a second dose if she has previously received 1 dose. Pregnant females who do not have evidence of immunity should receive the first dose after pregnancy. This first dose should be obtained before leaving the health care facility. The second dose should be obtained 4-8 weeks after the first dose.  Human papillomavirus (HPV) vaccine. Females aged 13-26 years who have not received the vaccine previously should obtain the 3-dose series. The vaccine is not recommended for use in pregnant females. However, pregnancy testing is not needed before receiving a dose. If a female is found to be pregnant after receiving a dose, no treatment is needed. In that case, the remaining doses should be delayed until after the pregnancy. Immunization is recommended for any person with an immunocompromised condition through the age of 61 years if she did not get any or all doses earlier. During the  3-dose series, the second dose should be obtained 4-8 weeks after the first dose. The third dose should be obtained 24 weeks after the first dose and 16 weeks after the second dose.  Zoster vaccine. One dose is recommended for adults aged 30 years or older unless certain conditions are present.  Measles, mumps, and rubella (MMR) vaccine. Adults born  before 1957 generally are considered immune to measles and mumps. Adults born in 1957 or later should have 1 or more doses of MMR vaccine unless there is a contraindication to the vaccine or there is laboratory evidence of immunity to each of the three diseases. A routine second dose of MMR vaccine should be obtained at least 28 days after the first dose for students attending postsecondary schools, health care workers, or international travelers. People who received inactivated measles vaccine or an unknown type of measles vaccine during 1963-1967 should receive 2 doses of MMR vaccine. People who received inactivated mumps vaccine or an unknown type of mumps vaccine before 1979 and are at high risk for mumps infection should consider immunization with 2 doses of MMR vaccine. For females of childbearing age, rubella immunity should be determined. If there is no evidence of immunity, females who are not pregnant should be vaccinated. If there is no evidence of immunity, females who are pregnant should delay immunization until after pregnancy. Unvaccinated health care workers born before 1957 who lack laboratory evidence of measles, mumps, or rubella immunity or laboratory confirmation of disease should consider measles and mumps immunization with 2 doses of MMR vaccine or rubella immunization with 1 dose of MMR vaccine.  Pneumococcal 13-valent conjugate (PCV13) vaccine. When indicated, a person who is uncertain of his immunization history and has no record of immunization should receive the PCV13 vaccine. All adults 65 years of age and older should receive this  vaccine. An adult aged 19 years or older who has certain medical conditions and has not been previously immunized should receive 1 dose of PCV13 vaccine. This PCV13 should be followed with a dose of pneumococcal polysaccharide (PPSV23) vaccine. Adults who are at high risk for pneumococcal disease should obtain the PPSV23 vaccine at least 8 weeks after the dose of PCV13 vaccine. Adults older than 41 years of age who have normal immune system function should obtain the PPSV23 vaccine dose at least 1 year after the dose of PCV13 vaccine.  Pneumococcal polysaccharide (PPSV23) vaccine. When PCV13 is also indicated, PCV13 should be obtained first. All adults aged 65 years and older should be immunized. An adult younger than age 65 years who has certain medical conditions should be immunized. Any person who resides in a nursing home or long-term care facility should be immunized. An adult smoker should be immunized. People with an immunocompromised condition and certain other conditions should receive both PCV13 and PPSV23 vaccines. People with human immunodeficiency virus (HIV) infection should be immunized as soon as possible after diagnosis. Immunization during chemotherapy or radiation therapy should be avoided. Routine use of PPSV23 vaccine is not recommended for American Indians, [place] Natives, or people younger than 65 years unless there are medical conditions that require PPSV23 vaccine. When indicated, people who have unknown immunization and have no record of immunization should receive PPSV23 vaccine. One-time revaccination 5 years after the first dose of PPSV23 is recommended for people aged 19-64 years who have chronic kidney failure, nephrotic syndrome, asplenia, or immunocompromised conditions. People who received 1-2 doses of PPSV23 before age 65 years should receive another dose of PPSV23 vaccine at age 65 years or later if at least 5 years have passed since the previous dose. Doses of PPSV23 are not  needed for people immunized with PPSV23 at or after age 65 years.  Meningococcal vaccine. Adults with asplenia or persistent complement component deficiencies should receive 2 doses of quadrivalent meningococcal conjugate (MenACWY-D) vaccine. The doses should be obtained   at least 2 months apart. Microbiologists working with certain meningococcal bacteria, [place] recruits, people at risk during an outbreak, and people who travel to or live in countries with a high rate of meningitis should be immunized. A first-year college student up through age 34 years who is living in a residence hall should receive a dose if she did not receive a dose on or after her 16th birthday. Adults who have certain high-risk conditions should receive one or more doses of vaccine.  Hepatitis A vaccine. Adults who wish to be protected from this disease, have certain high-risk conditions, work with hepatitis A-infected animals, work in hepatitis A research labs, or travel to or work in countries with a high rate of hepatitis A should be immunized. Adults who were previously unvaccinated and who anticipate close contact with an international adoptee during the first 60 days after arrival in the [place] from a country with a high rate of hepatitis A should be immunized.  Hepatitis B vaccine. Adults who wish to be protected from this disease, have certain high-risk conditions, may be exposed to blood or other infectious body fluids, are household contacts or sex partners of hepatitis B positive people, are clients or workers in certain care facilities, or travel to or work in countries with a high rate of hepatitis B should be immunized.  Haemophilus influenzae type b (Hib) vaccine. A previously unvaccinated person with asplenia or sickle cell disease or having a scheduled splenectomy should receive 1 dose of Hib vaccine. Regardless of previous immunization, a recipient of a hematopoietic stem cell transplant should receive a  3-dose series 6-12 months after her successful transplant. Hib vaccine is not recommended for adults with HIV infection. Preventive Services / Frequency Ages 35 to 4 years  Blood pressure check.** / Every 3-5 years.  Lipid and cholesterol check.** / Every 5 years beginning at age 60.  Clinical breast exam.** / Every 3 years for women in their 71s and 10s.  BRCA-related cancer risk assessment.** / For women who have family members with a BRCA-related cancer (breast, ovarian, tubal, or peritoneal cancers).  Pap test.** / Every 2 years from ages 76 through 26. Every 3 years starting at age 61 through age 76 or 93 with a history of 3 consecutive normal Pap tests.  HPV screening.** / Every 3 years from ages 37 through ages 60 to 51 with a history of 3 consecutive normal Pap tests.  Hepatitis C blood test.** / For any individual with known risks for hepatitis C.  Skin self-exam. / Monthly.  Influenza vaccine. / Every year.  Tetanus, diphtheria, and acellular pertussis (Tdap, Td) vaccine.** / Consult your health care provider. Pregnant women should receive 1 dose of Tdap vaccine during each pregnancy. 1 dose of Td every 10 years.  Varicella vaccine.** / Consult your health care provider. Pregnant females who do not have evidence of immunity should receive the first dose after pregnancy.  HPV vaccine. / 3 doses over 6 months, if 93 and younger. The vaccine is not recommended for use in pregnant females. However, pregnancy testing is not needed before receiving a dose.  Measles, mumps, rubella (MMR) vaccine.** / You need at least 1 dose of MMR if you were born in 1957 or later. You may also need a 2nd dose. For females of childbearing age, rubella immunity should be determined. If there is no evidence of immunity, females who are not pregnant should be vaccinated. If there is no evidence of immunity, females who are  pregnant should delay immunization until after pregnancy.  Pneumococcal  13-valent conjugate (PCV13) vaccine.** / Consult your health care provider.  Pneumococcal polysaccharide (PPSV23) vaccine.** / 1 to 2 doses if you smoke cigarettes or if you have certain conditions.  Meningococcal vaccine.** / 1 dose if you are age 68 to 8 years and a Market researcher living in a residence hall, or have one of several medical conditions, you need to get vaccinated against meningococcal disease. You may also need additional booster doses.  Hepatitis A vaccine.** / Consult your health care provider.  Hepatitis B vaccine.** / Consult your health care provider.  Haemophilus influenzae type b (Hib) vaccine.** / Consult your health care provider. Ages 7 to 53 years  Blood pressure check.** / Every year.  Lipid and cholesterol check.** / Every 5 years beginning at age 25 years.  Lung cancer screening. / Every year if you are aged 11-80 years and have a 30-pack-year history of smoking and currently smoke or have quit within the past 15 years. Yearly screening is stopped once you have quit smoking for at least 15 years or develop a health problem that would prevent you from having lung cancer treatment.  Clinical breast exam.** / Every year after age 48 years.  BRCA-related cancer risk assessment.** / For women who have family members with a BRCA-related cancer (breast, ovarian, tubal, or peritoneal cancers).  Mammogram.** / Every year beginning at age 41 years and continuing for as long as you are in good health. Consult with your health care provider.  Pap test.** / Every 3 years starting at age 65 years through age 37 or 70 years with a history of 3 consecutive normal Pap tests.  HPV screening.** / Every 3 years from ages 72 years through ages 60 to 40 years with a history of 3 consecutive normal Pap tests.  Fecal occult blood test (FOBT) of stool. / Every year beginning at age 21 years and continuing until age 5 years. You may not need to do this test if you get  a colonoscopy every 10 years.  Flexible sigmoidoscopy or colonoscopy.** / Every 5 years for a flexible sigmoidoscopy or every 10 years for a colonoscopy beginning at age 35 years and continuing until age 48 years.  Hepatitis C blood test.** / For all people born from [birth year] through 1965 and any individual with known risks for hepatitis C.  Skin self-exam. / Monthly.  Influenza vaccine. / Every year.  Tetanus, diphtheria, and acellular pertussis (Tdap/Td) vaccine.** / Consult your health care provider. Pregnant women should receive 1 dose of Tdap vaccine during each pregnancy. 1 dose of Td every 10 years.  Varicella vaccine.** / Consult your health care provider. Pregnant females who do not have evidence of immunity should receive the first dose after pregnancy.  Zoster vaccine.** / 1 dose for adults aged 30 years or older.  Measles, mumps, rubella (MMR) vaccine.** / You need at least 1 dose of MMR if you were born in 1957 or later. You may also need a second dose. For females of childbearing age, rubella immunity should be determined. If there is no evidence of immunity, females who are not pregnant should be vaccinated. If there is no evidence of immunity, females who are pregnant should delay immunization until after pregnancy.  Pneumococcal 13-valent conjugate (PCV13) vaccine.** / Consult your health care provider.  Pneumococcal polysaccharide (PPSV23) vaccine.** / 1 to 2 doses if you smoke cigarettes or if you have certain conditions.  Meningococcal vaccine.** /  Consult your health care provider.  Hepatitis A vaccine.** / Consult your health care provider.  Hepatitis B vaccine.** / Consult your health care provider.  Haemophilus influenzae type b (Hib) vaccine.** / Consult your health care provider. Ages 64 years and over  Blood pressure check.** / Every year.  Lipid and cholesterol check.** / Every 5 years beginning at age 23 years.  Lung cancer screening. / Every year if you  are aged 16-80 years and have a 30-pack-year history of smoking and currently smoke or have quit within the past 15 years. Yearly screening is stopped once you have quit smoking for at least 15 years or develop a health problem that would prevent you from having lung cancer treatment.  Clinical breast exam.** / Every year after age 74 years.  BRCA-related cancer risk assessment.** / For women who have family members with a BRCA-related cancer (breast, ovarian, tubal, or peritoneal cancers).  Mammogram.** / Every year beginning at age 44 years and continuing for as long as you are in good health. Consult with your health care provider.  Pap test.** / Every 3 years starting at age 58 years through age 22 or 39 years with 3 consecutive normal Pap tests. Testing can be stopped between 65 and 70 years with 3 consecutive normal Pap tests and no abnormal Pap or HPV tests in the past 10 years.  HPV screening.** / Every 3 years from ages 64 years through ages 70 or 61 years with a history of 3 consecutive normal Pap tests. Testing can be stopped between 65 and 70 years with 3 consecutive normal Pap tests and no abnormal Pap or HPV tests in the past 10 years.  Fecal occult blood test (FOBT) of stool. / Every year beginning at age 40 years and continuing until age 27 years. You may not need to do this test if you get a colonoscopy every 10 years.  Flexible sigmoidoscopy or colonoscopy.** / Every 5 years for a flexible sigmoidoscopy or every 10 years for a colonoscopy beginning at age 7 years and continuing until age 32 years.  Hepatitis C blood test.** / For all people born from [birth year] through 1966 and any individual with known risks for hepatitis C.  Osteoporosis screening.** / A one-time screening for women ages 30 years and over and women at risk for fractures or osteoporosis.  Skin self-exam. / Monthly.  Influenza vaccine. / Every year.  Tetanus, diphtheria, and acellular pertussis (Tdap/Td)  vaccine.** / 1 dose of Td every 10 years.  Varicella vaccine.** / Consult your health care provider.  Zoster vaccine.** / 1 dose for adults aged 35 years or older.  Pneumococcal 13-valent conjugate (PCV13) vaccine.** / Consult your health care provider.  Pneumococcal polysaccharide (PPSV23) vaccine.** / 1 dose for all adults aged 46 years and older.  Meningococcal vaccine.** / Consult your health care provider.  Hepatitis A vaccine.** / Consult your health care provider.  Hepatitis B vaccine.** / Consult your health care provider.  Haemophilus influenzae type b (Hib) vaccine.** / Consult your health care provider. ** Family history and personal history of risk and conditions may change your health care provider's recommendations.   This information is not intended to replace advice given to you by your health care provider. Make sure you discuss any questions you have with your health care provider.   Document Released: 01/24/2002 Document Revised: 12/19/2014 Document Reviewed: 04/25/2011 Elsevier Interactive Patient Education [employer].

## 2016-05-15 NOTE — Assessment & Plan Note (Signed)
Encouraged DASH diet, decrease po intake and increase exercise as tolerated. Needs 7-8 hours of sleep nightly. Avoid trans fats, eat small, frequent meals every 4-5 hours with lean proteins, complex carbs and healthy fats. Minimize simple carbs 

## 2016-05-15 NOTE — Assessment & Plan Note (Signed)
Well controlled, no changes to meds. Encouraged heart healthy diet such as the DASH diet and exercise as tolerated.  °

## 2016-05-15 NOTE — Progress Notes (Signed)
Patient ID: Christina Mendez, female   DOB: [birth year], 40 y.o.   MRN: [id number]   Subjective:    Patient ID: Christina Mendez, female    DOB: [birth year], 40 y.o.   MRN: [id number]  Chief Complaint  Patient presents with  . Annual Exam    HPI Patient is in today for follow up on HTN. 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 Denies CP/palp/SOB/HA/congestion/fevers/GI or GU c/o. Taking meds as prescribed  Past Medical History  Diagnosis Date  . Heartburn   . Chicken pox 3rd grade  . Hypertension   . Allergy last couple of years    seasonal  . HTN (hypertension) 12/09/2011  . Obese 12/09/2011  . Sinusitis 01/08/2012  . Cervical cancer screening 03/02/2012  . Allergic state 03/02/2012  . Inflamed skin tag 03/02/2012  . Ovarian cyst 08/17/2012  . Menorrhagia 05/04/2015  . GERD (gastroesophageal reflux disease)   . Hyperlipidemia   . OSA (obstructive sleep apnea)     Past Surgical History  Procedure Laterality Date  . Cyst removed  40 yr old    benign, abdominal    Family History  Problem Relation Age of Onset  . Fibromyalgia Mother   . Diabetes Mother     type 2  . Hypertension Mother   . Diabetes Father     type 2  . Heart disease Father [id number]    2 MI  . Hypertension Father   . Heart attack Father 23    X 2  . Stroke Father     mini strokes  . GER disease Father   . Hyperlipidemia Father   . Cholelithiasis Father   . Venous thrombosis Father     in liver  . Hypertension Brother   . Venous thrombosis Brother     on spine  . Cancer Maternal Grandfather     stomach  . Hypertension Paternal Grandmother   . Diabetes Paternal Grandmother     type 2  . Heart attack Paternal Grandmother   . Heart disease Paternal Grandmother   . Heart attack Paternal Grandfather   . Heart disease Paternal Grandfather   . Hypertension Paternal Grandfather   .  Cancer Maternal Uncle     prostate cancer    Social History   Social History  . Marital Status: Single    Spouse Name: N/A  . Number of Children: N/A  . Years of Education: N/A   Occupational History  . Teacher     Social History Main Topics  . Smoking status: Never Smoker   . Smokeless tobacco: Never Used  . Alcohol Use: Yes     Comment: OCCASIONALLY  . Drug Use: No  . Sexual Activity:    Partners: Male     Comment: No dietary restrictions, lives with Mom, Dad and brother and niece.  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 Not on file   Social History Narrative    Outpatient Prescriptions Prior to Visit  Medication Sig Dispense Refill  . albuterol (PROVENTIL HFA;VENTOLIN HFA) 108 (90 BASE) MCG/ACT inhaler Inhale 2 puffs into the lungs every 6 (six) hours as needed for wheezing or shortness of breath. 1 Inhaler 0  . hyoscyamine (LEVSIN SL) 0.125 MG SL tablet Place 1 tablet (0.125 mg total) under the tongue 3 (three) times daily as needed. 30 tablet 2  . losartan (  COZAAR) 25 MG tablet TAKE 1 TABLET (25 MG TOTAL) BY MOUTH DAILY. 90 tablet 1  . metoprolol succinate (TOPROL-XL) 25 MG 24 hr tablet TAKE ONE TABLET BY MOUTH ONE TIME DAILY 90 tablet 0  . montelukast (SINGULAIR) 10 MG tablet TAKE ONE TABLET BY MOUTH AT BEDTIME 30 tablet 6  . omeprazole (PRILOSEC) 40 MG capsule TAKE 1 CAPSULE (40 MG TOTAL) BY MOUTH DAILY. 90 capsule 1  . TGT ALL DAY ALLERGY RELIEF 10 MG tablet Take one tablet by mouth one time daily 30 tablet 4  . fluticasone (FLONASE) 50 MCG/ACT nasal spray Place 2 sprays into the nose daily as needed for rhinitis or allergies. 16 g 2   No facility-administered medications prior to visit.    Allergies  Allergen Reactions  . Skelaxin Hypertension    Review of Systems  Constitutional: Negative for fever and malaise/fatigue.  HENT: Negative for congestion.   Eyes: Negative for blurred vision.  Respiratory: Negative for shortness of breath.     Cardiovascular: Negative for chest pain, palpitations and leg swelling.  Gastrointestinal: Negative for nausea, abdominal pain and blood in stool.  Genitourinary: Negative for dysuria and frequency.  Musculoskeletal: Negative for falls.  Skin: Negative for rash.  Neurological: Negative for dizziness, loss of consciousness and headaches.  Endo/Heme/Allergies: Negative for environmental allergies.  Psychiatric/Behavioral: Negative for depression. The patient is not nervous/anxious.        Objective:    Physical Exam  Constitutional: She is oriented to person, place, and time. She appears well-developed and well-nourished. No distress.  HENT:  Head: Normocephalic and atraumatic.  Nose: Nose normal.  Eyes: Right eye exhibits no discharge. Left eye exhibits no discharge.  Neck: Normal range of motion. Neck supple.  Cardiovascular: Normal rate and regular rhythm.   No murmur heard. Pulmonary/Chest: Effort normal and breath sounds normal.  Abdominal: Soft. Bowel sounds are normal. There is no tenderness.  Musculoskeletal: She exhibits no edema.  Neurological: She is alert and oriented to person, place, and time.  Skin: Skin is warm and dry.  Psychiatric: She has a normal mood and affect.  Nursing note and vitals reviewed.   BP 122/70 mmHg  Pulse 66  Temp(Src) 98.3 F (36.8 C) (Oral)  Ht 5' 8.5" (1.74 m)  Wt 346 lb (156.945 kg)  BMI 51.84 kg/m2  SpO2 96% Wt Readings from Last 3 Encounters:  05/06/16 346 lb (156.945 kg)  09/08/15 348 lb 8 oz (158.079 kg)  07/28/15 360 lb (163.295 kg)     Lab Results  Component Value Date   WBC 5.6 05/04/2015   HGB 14.0 05/04/2015   HCT 41.3 05/04/2015   PLT 280.0 05/04/2015   GLUCOSE 83 05/04/2015   CHOL 160 05/04/2015   TRIG 75.0 05/04/2015   HDL 51.30 05/04/2015   LDLCALC 94 05/04/2015   ALT 17 05/04/2015   AST 18 05/04/2015   NA 137 05/04/2015   K 3.7 05/04/2015   CL 104 05/04/2015   CREATININE 0.70 05/04/2015   BUN 8  05/04/2015   CO2 29 05/04/2015   TSH 1.00 05/04/2015    Lab Results  Component Value Date   TSH 1.00 05/04/2015   Lab Results  Component Value Date   WBC 5.6 05/04/2015   HGB 14.0 05/04/2015   HCT 41.3 05/04/2015   MCV 88.1 05/04/2015   PLT 280.0 05/04/2015   Lab Results  Component Value Date   NA 137 05/04/2015   K 3.7 05/04/2015   CO2 29 05/04/2015  GLUCOSE 83 05/04/2015   BUN 8 05/04/2015   CREATININE 0.70 05/04/2015   BILITOT 0.4 05/04/2015   ALKPHOS 57 05/04/2015   AST 18 05/04/2015   ALT 17 05/04/2015   PROT 7.2 05/04/2015   ALBUMIN 4.1 05/04/2015   CALCIUM 8.9 05/04/2015   GFR 118.99 05/04/2015   Lab Results  Component Value Date   CHOL 160 05/04/2015   Lab Results  Component Value Date   HDL 51.30 05/04/2015   Lab Results  Component Value Date   LDLCALC 94 05/04/2015   Lab Results  Component Value Date   TRIG 75.0 05/04/2015   Lab Results  Component Value Date   CHOLHDL 3 05/04/2015   No results found for: HGBA1C     Assessment & Plan:   Problem List Items Addressed This Visit    Preventative health care   Obese    Encouraged DASH diet, decrease po intake and increase exercise as tolerated. Needs 7-8 hours of sleep nightly. Avoid trans fats, eat small, frequent meals every 4-5 hours with lean proteins, complex carbs and healthy fats. Minimize simple carbs      Hyponatremia   Hyperlipidemia, mixed - Primary   HTN (hypertension)    Well controlled, no changes to meds. Encouraged heart healthy diet such as the DASH diet and exercise as tolerated.       Heartburn      I am having Ms. Camero maintain her fluticasone, TGT ALL DAY ALLERGY RELIEF, albuterol, montelukast, hyoscyamine, metoprolol succinate, omeprazole, and losartan.  No orders of the defined types were placed in this encounter.     Danise Edge, MD

## 2016-05-31 ENCOUNTER — Ambulatory Visit: Payer: Self-pay

## 2016-06-01 ENCOUNTER — Other Ambulatory Visit: Payer: Self-pay | Admitting: Family Medicine

## 2016-06-01 NOTE — Telephone Encounter (Signed)
Rx filled 06/01/16.  

## 2016-10-28 ENCOUNTER — Ambulatory Visit: Payer: Self-pay | Admitting: Family Medicine

## 2016-10-28 DIAGNOSIS — Z0289 Encounter for other administrative examinations: Secondary | ICD-10-CM

## 2016-10-31 ENCOUNTER — Encounter: Payer: Self-pay | Admitting: Family Medicine

## 2016-11-13 ENCOUNTER — Other Ambulatory Visit: Payer: Self-pay | Admitting: Family Medicine

## 2016-11-28 ENCOUNTER — Other Ambulatory Visit: Payer: Self-pay | Admitting: Family Medicine

## 2017-05-14 ENCOUNTER — Other Ambulatory Visit: Payer: Self-pay | Admitting: Family Medicine

## 2017-05-31 ENCOUNTER — Telehealth: Payer: Self-pay | Admitting: Family Medicine

## 2017-05-31 ENCOUNTER — Other Ambulatory Visit: Payer: Self-pay | Admitting: Family Medicine

## 2017-05-31 MED ORDER — METOPROLOL SUCCINATE ER 25 MG PO TB24: 25.0000 mg | ORAL_TABLET | Freq: Every day | ORAL | 0 refills | Status: DC

## 2017-05-31 NOTE — Telephone Encounter (Signed)
Patient called trying to see why her medication refill was reject. I spoke with assistant and she informed me that patient needed an appointment. Patient explained she does not currently have insurance and cannot afford to come in for visit. I did tell patient have financial assistance applications she could fill out to see if she qualifies. She would be required to pay $72 upfront for visit but could bill if she did not have it. They assistance may retro and cover her visit. Patient agreed to come in at the end of next month for medication follow. She is asking a refill and she will come by Friday to get the application.

## 2017-05-31 NOTE — Telephone Encounter (Signed)
Sent in metoprolol #30 to cover until being seen. Attempted to call the patient but number in chart was not working.

## 2017-07-11 ENCOUNTER — Encounter: Payer: Self-pay | Admitting: Family Medicine

## 2017-07-11 ENCOUNTER — Ambulatory Visit (INDEPENDENT_AMBULATORY_CARE_PROVIDER_SITE_OTHER): Payer: Self-pay | Admitting: Family Medicine

## 2017-07-11 DIAGNOSIS — E669 Obesity, unspecified: Secondary | ICD-10-CM

## 2017-07-11 DIAGNOSIS — I10 Essential (primary) hypertension: Secondary | ICD-10-CM

## 2017-07-11 DIAGNOSIS — R600 Localized edema: Secondary | ICD-10-CM

## 2017-07-11 DIAGNOSIS — K219 Gastro-esophageal reflux disease without esophagitis: Secondary | ICD-10-CM

## 2017-07-11 HISTORY — DX: Localized edema: R60.0

## 2017-07-11 MED ORDER — METOPROLOL SUCCINATE ER 25 MG PO TB24: 25.0000 mg | ORAL_TABLET | Freq: Every day | ORAL | 1 refills | Status: DC

## 2017-07-11 MED ORDER — FUROSEMIDE 20 MG PO TABS: 20.0000 mg | ORAL_TABLET | Freq: Every day | ORAL | 3 refills | Status: DC | PRN

## 2017-07-11 NOTE — Patient Instructions (Addendum)
MIND diet   CoverMyMeds DASH Eating Plan DASH stands for "Dietary Approaches to Stop Hypertension." The DASH eating plan is a healthy eating plan that has been shown to reduce high blood pressure (hypertension). It may also reduce your risk for type 2 diabetes, heart disease, and stroke. The DASH eating plan may also help with weight loss. What are tips for following this plan? General guidelines  Avoid eating more than 2,300 mg (milligrams) of salt (sodium) a day. If you have hypertension, you may need to reduce your sodium intake to 1,500 mg a day.  Limit alcohol intake to no more than 1 drink a day for nonpregnant women and 2 drinks a day for men. One drink equals 12 oz of beer, 5 oz of wine, or 1 oz of hard liquor.  Work with your health care provider to maintain a healthy body weight or to lose weight. Ask what an ideal weight is for you.  Get at least 30 minutes of exercise that causes your heart to beat faster (aerobic exercise) most days of the week. Activities may include walking, swimming, or biking.  Work with your health care provider or diet and nutrition specialist (dietitian) to adjust your eating plan to your individual calorie needs. Reading food labels  Check food labels for the amount of sodium per serving. Choose foods with less than 5 percent of the Daily Value of sodium. Generally, foods with less than 300 mg of sodium per serving fit into this eating plan.  To find whole grains, look for the word "whole" as the first word in the ingredient list. Shopping  Buy products labeled as "low-sodium" or "no salt added."  Buy fresh foods. Avoid canned foods and premade or frozen meals. Cooking  Avoid adding salt when cooking. Use salt-free seasonings or herbs instead of table salt or sea salt. Check with your health care provider or pharmacist before using salt substitutes.  Do not fry foods. Cook foods using healthy methods such as baking, boiling, grilling, and broiling  instead.  Cook with heart-healthy oils, such as olive, canola, soybean, or sunflower oil. Meal planning   Eat a balanced diet that includes: ? 5 or more servings of fruits and vegetables each day. At each meal, try to fill half of your plate with fruits and vegetables. ? Up to 6-8 servings of whole grains each day. ? Less than 6 oz of lean meat, poultry, or fish each day. A 3-oz serving of meat is about the same size as a deck of cards. One egg equals 1 oz. ? 2 servings of low-fat dairy each day. ? A serving of nuts, seeds, or beans 5 times each week. ? Heart-healthy fats. Healthy fats called Omega-3 fatty acids are found in foods such as flaxseeds and coldwater fish, like sardines, salmon, and mackerel.  Limit how much you eat of the following: ? Canned or prepackaged foods. ? Food that is high in trans fat, such as fried foods. ? Food that is high in saturated fat, such as fatty meat. ? Sweets, desserts, sugary drinks, and other foods with added sugar. ? Full-fat dairy products.  Do not salt foods before eating.  Try to eat at least 2 vegetarian meals each week.  Eat more home-cooked food and less restaurant, buffet, and fast food.  When eating at a restaurant, ask that your food be prepared with less salt or no salt, if possible. What foods are recommended? The items listed may not be a complete list. Talk  with your dietitian about what dietary choices are best for you. Grains Whole-grain or whole-wheat bread. Whole-grain or whole-wheat pasta. Brown rice. [place]. Bulgur. Whole-grain and low-sodium cereals. Pita bread. Low-fat, low-sodium crackers. Whole-wheat flour tortillas. Vegetables Fresh or frozen vegetables (raw, steamed, roasted, or grilled). Low-sodium or reduced-sodium tomato and vegetable juice. Low-sodium or reduced-sodium tomato sauce and tomato paste. Low-sodium or reduced-sodium canned vegetables. Fruits All fresh, dried, or frozen fruit. Canned fruit in  natural juice (without added sugar). Meat and other protein foods Skinless chicken or [place]. Ground chicken or [place]. Pork with fat trimmed off. Fish and seafood. Egg whites. Dried beans, peas, or lentils. Unsalted nuts, nut butters, and seeds. Unsalted canned beans. Lean cuts of beef with fat trimmed off. Low-sodium, lean deli meat. Dairy Low-fat (1%) or fat-free (skim) milk. Fat-free, low-fat, or reduced-fat cheeses. Nonfat, low-sodium ricotta or cottage cheese. Low-fat or nonfat yogurt. Low-fat, low-sodium cheese. Fats and oils Soft margarine without trans fats. Vegetable oil. Low-fat, reduced-fat, or light mayonnaise and salad dressings (reduced-sodium). Canola, safflower, olive, soybean, and sunflower oils. Avocado. Seasoning and other foods Herbs. Spices. Seasoning mixes without salt. Unsalted popcorn and pretzels. Fat-free sweets. What foods are not recommended? The items listed may not be a complete list. Talk with your dietitian about what dietary choices are best for you. Grains Baked goods made with fat, such as croissants, muffins, or some breads. Dry pasta or rice meal packs. Vegetables Creamed or fried vegetables. Vegetables in a cheese sauce. Regular canned vegetables (not low-sodium or reduced-sodium). Regular canned tomato sauce and paste (not low-sodium or reduced-sodium). Regular tomato and vegetable juice (not low-sodium or reduced-sodium). Angie Fava. Olives. Fruits Canned fruit in a light or heavy syrup. Fried fruit. Fruit in cream or butter sauce. Meat and other protein foods Fatty cuts of meat. Ribs. Fried meat. Berniece Salines. Sausage. Bologna and other processed lunch meats. Salami. Fatback. Hotdogs. Bratwurst. Salted nuts and seeds. Canned beans with added salt. Canned or smoked fish. Whole eggs or egg yolks. Chicken or [place] with skin. Dairy Whole or 2% milk, cream, and half-and-half. Whole or full-fat cream cheese. Whole-fat or sweetened yogurt. Full-fat cheese. Nondairy  creamers. Whipped toppings. Processed cheese and cheese spreads. Fats and oils Butter. Stick margarine. Lard. Shortening. Ghee. Bacon fat. Tropical oils, such as coconut, palm kernel, or palm oil. Seasoning and other foods Salted popcorn and pretzels. Onion salt, garlic salt, seasoned salt, table salt, and sea salt. Worcestershire sauce. Tartar sauce. Barbecue sauce. Teriyaki sauce. Soy sauce, including reduced-sodium. Steak sauce. Canned and packaged gravies. Fish sauce. Oyster sauce. Cocktail sauce. Horseradish that you find on the shelf. Ketchup. Mustard. Meat flavorings and tenderizers. Bouillon cubes. Hot sauce and Tabasco sauce. Premade or packaged marinades. Premade or packaged taco seasonings. Relishes. Regular salad dressings. Where to find more information:  National Heart, Lung, and [place]: https://wilson-eaton.com/  American Heart Association: www.heart.org Summary  The DASH eating plan is a healthy eating plan that has been shown to reduce high blood pressure (hypertension). It may also reduce your risk for type 2 diabetes, heart disease, and stroke.  With the DASH eating plan, you should limit salt (sodium) intake to 2,300 mg a day. If you have hypertension, you may need to reduce your sodium intake to 1,500 mg a day.  When on the DASH eating plan, aim to eat more fresh fruits and vegetables, whole grains, lean proteins, low-fat dairy, and heart-healthy fats.  Work with your health care provider or diet and nutrition specialist (dietitian) to adjust your  eating plan to your individual calorie needs. This information is not intended to replace advice given to you by your health care provider. Make sure you discuss any questions you have with your health care provider. Document Released: 11/17/2011 Document Revised: 11/21/2016 Document Reviewed: 11/21/2016 Elsevier Interactive Patient Education  2017 [employer].

## 2017-07-11 NOTE — Assessment & Plan Note (Signed)
Minimize sodium, exercise, elevate feet above heart, compression hose and Lasix prn

## 2017-07-11 NOTE — Assessment & Plan Note (Signed)
Refilled Prilosec

## 2017-07-11 NOTE — Assessment & Plan Note (Signed)
Well controlled, no changes to meds. Encouraged heart healthy diet such as the DASH diet and exercise as tolerated.  °

## 2017-07-11 NOTE — Assessment & Plan Note (Signed)
Encouraged DASH diet, decrease po intake and increase exercise as tolerated. Needs 7-8 hours of sleep nightly. Avoid trans fats, eat small, frequent meals every 4-5 hours with lean proteins, complex carbs and healthy fats. Minimize simple carbs, bariatric referral made 

## 2017-07-11 NOTE — Progress Notes (Signed)
Subjective:  I acted as a Education administrator for Dr. Charlett Blake. [place], [place]   Patient ID: Christina Mendez, female    DOB: Jan 04, 1976, 41 y.o.   MRN: [id number]  No chief complaint on file.   HPI  Patient is in today for a medication follow up for her Metoprolol. She c/o edema in both ankles on going for a few months. She also states she has an ingrown hair in the top of her head. No recent febrile illness or acute hospitalizations. Denies CP/palp/SOB/HA/congestion/fevers/GI or GU c/o. Taking meds as prescribed. She is frustrated with persistent edema in her legs. She has been trying to eat less carbs and eat at home more but she is still gaining weight. She does not get health insurance at her work.    Patient Care Team: Mosie Lukes, MD as PCP - General (Family Medicine)   Past Medical History:  Diagnosis Date  . Allergic state 03/02/2012  . Allergy last couple of years   seasonal  . Cervical cancer screening 03/02/2012  . Chicken pox 3rd grade  . GERD (gastroesophageal reflux disease)   . Heartburn   . HTN (hypertension) 12/09/2011  . Hyperlipidemia   . Hypertension   . Inflamed skin tag 03/02/2012  . Menorrhagia 05/04/2015  . Obese 12/09/2011  . OSA (obstructive sleep apnea)   . Ovarian cyst 08/17/2012  . Pedal edema 07/11/2017  . Sinusitis 01/08/2012    Past Surgical History:  Procedure Laterality Date  . cyst removed  41 yr old   benign, abdominal    Family History  Problem Relation Age of Onset  . Fibromyalgia Mother   . Diabetes Mother        type 2  . Hypertension Mother   . Diabetes Father        type 2  . Heart disease Father 47       2 MI  . Hypertension Father   . Heart attack Father 23       X 2  . Stroke Father        mini strokes  . GER disease Father   . Hyperlipidemia Father   . Cholelithiasis Father   . Venous thrombosis Father        in liver  . Hypertension Brother   . Venous thrombosis Brother        on spine  . Cancer Maternal Grandfather       stomach  . Hypertension Paternal Grandmother   . Diabetes Paternal Grandmother        type 2  . Heart attack Paternal Grandmother   . Heart disease Paternal Grandmother   . Heart attack Paternal Grandfather   . Heart disease Paternal Grandfather   . Hypertension Paternal Grandfather   . Cancer Maternal Uncle        prostate cancer    Social History   Social History  . Marital status: Single    Spouse name: N/A  . Number of children: N/A  . Years of education: N/A   Occupational History  . Teacher     Social History Main Topics  . Smoking status: Never Smoker  . Smokeless tobacco: Never Used  . Alcohol use Yes     Comment: OCCASIONALLY  . Drug use: No  . Sexual activity: Yes    Partners: Male     Comment: No dietary restrictions, lives with Mom, Dad and brother and niece.  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  Not on file   Social History Narrative  . No narrative on file    Outpatient Medications Prior to Visit  Medication Sig Dispense Refill  . losartan (COZAAR) 25 MG tablet TAKE 1 TABLET EVERY DAY 90 tablet 1  . omeprazole (PRILOSEC) 40 MG capsule TAKE 1 CAPSULE EVERY DAY 90 capsule 1  . metoprolol succinate (TOPROL-XL) 25 MG 24 hr tablet Take 1 tablet (25 mg total) by mouth daily. 30 tablet 0  . fluticasone (FLONASE) 50 MCG/ACT nasal spray Place 2 sprays into the nose daily as needed for rhinitis or allergies. 16 g 2  . albuterol (PROVENTIL HFA;VENTOLIN HFA) 108 (90 BASE) MCG/ACT inhaler Inhale 2 puffs into the lungs every 6 (six) hours as needed for wheezing or shortness of breath. 1 Inhaler 0  . hyoscyamine (LEVSIN SL) 0.125 MG SL tablet Place 1 tablet (0.125 mg total) under the tongue 3 (three) times daily as needed. 30 tablet 2  . montelukast (SINGULAIR) 10 MG tablet TAKE ONE TABLET BY MOUTH AT BEDTIME 30 tablet 6  . TGT ALL DAY ALLERGY RELIEF 10 MG tablet Take one tablet by mouth one time daily 30 tablet 4   No facility-administered  medications prior to visit.     Allergies  Allergen Reactions  . Skelaxin Hypertension    Review of Systems  Constitutional: Negative for fever and malaise/fatigue.  HENT: Negative for congestion.   Eyes: Negative for blurred vision.  Respiratory: Negative for cough and shortness of breath.   Cardiovascular: Positive for leg swelling. Negative for chest pain and palpitations.  Gastrointestinal: Negative for vomiting.  Musculoskeletal: Negative for back pain.  Skin: Negative for rash.  Neurological: Negative for loss of consciousness and headaches.       Objective:    Physical Exam  Constitutional: She is oriented to person, place, and time. She appears well-developed and well-nourished. No distress.  HENT:  Head: Normocephalic and atraumatic.  Eyes: Conjunctivae are normal.  Neck: Normal range of motion. No thyromegaly present.  Cardiovascular: Normal rate and regular rhythm.   Pulmonary/Chest: Effort normal and breath sounds normal. She has no wheezes.  Abdominal: Soft. Bowel sounds are normal. There is no tenderness.  Musculoskeletal: Normal range of motion. She exhibits no edema or deformity.  Neurological: She is alert and oriented to person, place, and time.  Skin: Skin is warm and dry. She is not diaphoretic.  Psychiatric: She has a normal mood and affect.    BP 130/86 (BP Location: Left Arm, Patient Position: Sitting, Cuff Size: Normal)   Pulse 62   Temp 97.7 F (36.5 C) (Oral)   Resp 18   Wt (!) 361 lb 3.2 oz (163.8 kg)   SpO2 98%   BMI 54.12 kg/m  Wt Readings from Last 3 Encounters:  07/11/17 (!) 361 lb 3.2 oz (163.8 kg)  05/06/16 (!) 346 lb (156.9 kg)  09/08/15 (!) 348 lb 8 oz (158.1 kg)   BP Readings from Last 3 Encounters:  07/11/17 130/86  05/06/16 122/70  01/26/16 150/78     Immunization History  Administered Date(s) Administered  . DTaP 03/03/1975, 04/02/1975, 05/02/1975, 01/01/1979, 02/13/1981  . Hepatitis B, adult 11/30/2015, 01/01/2016    . IPV 03/03/1975, 04/02/1975, 05/02/1975, 01/01/1979, 02/13/1981  . Influenza Split 12/09/2011, 08/17/2012  . Influenza, Seasonal, Injecte, Preservative Fre 09/05/2015  . Influenza,inj,Quad PF,36+ Mos 08/21/2014  . Influenza-Unspecified 09/11/2013  . MMR 03/26/1976, 12/23/2010  . PPD Test 12/21/2011, 03/31/2014, 11/27/2015  . Td 02/20/2003, 03/31/2014  . Tdap 02/20/2003  Health Maintenance  Topic Date Due  . HIV Screening  01/01/1990  . PAP SMEAR  03/31/2017  . INFLUENZA VACCINE  07/12/2017  . TETANUS/TDAP  03/31/2024    Lab Results  Component Value Date   WBC 5.6 05/04/2015   HGB 14.0 05/04/2015   HCT 41.3 05/04/2015   PLT 280.0 05/04/2015   GLUCOSE 83 05/04/2015   CHOL 160 05/04/2015   TRIG 75.0 05/04/2015   HDL 51.30 05/04/2015   LDLCALC 94 05/04/2015   ALT 17 05/04/2015   AST 18 05/04/2015   NA 137 05/04/2015   K 3.7 05/04/2015   CL 104 05/04/2015   CREATININE 0.70 05/04/2015   BUN 8 05/04/2015   CO2 29 05/04/2015   TSH 1.00 05/04/2015    Lab Results  Component Value Date   TSH 1.00 05/04/2015   Lab Results  Component Value Date   WBC 5.6 05/04/2015   HGB 14.0 05/04/2015   HCT 41.3 05/04/2015   MCV 88.1 05/04/2015   PLT 280.0 05/04/2015   Lab Results  Component Value Date   NA 137 05/04/2015   K 3.7 05/04/2015   CO2 29 05/04/2015   GLUCOSE 83 05/04/2015   BUN 8 05/04/2015   CREATININE 0.70 05/04/2015   BILITOT 0.4 05/04/2015   ALKPHOS 57 05/04/2015   AST 18 05/04/2015   ALT 17 05/04/2015   PROT 7.2 05/04/2015   ALBUMIN 4.1 05/04/2015   CALCIUM 8.9 05/04/2015   GFR 118.99 05/04/2015   Lab Results  Component Value Date   CHOL 160 05/04/2015   Lab Results  Component Value Date   HDL 51.30 05/04/2015   Lab Results  Component Value Date   LDLCALC 94 05/04/2015   Lab Results  Component Value Date   TRIG 75.0 05/04/2015   Lab Results  Component Value Date   CHOLHDL 3 05/04/2015   No results found for: HGBA1C        Assessment & Plan:   Problem List Items Addressed This Visit    HTN (hypertension)    Well controlled, no changes to meds. Encouraged heart healthy diet such as the DASH diet and exercise as tolerated.       Relevant Medications   metoprolol succinate (TOPROL-XL) 25 MG 24 hr tablet   Obese    Encouraged DASH diet, decrease po intake and increase exercise as tolerated. Needs 7-8 hours of sleep nightly. Avoid trans fats, eat small, frequent meals every 4-5 hours with lean proteins, complex carbs and healthy fats. Minimize simple carbs, bariatric referral made      Pedal edema    Minimize sodium, exercise, elevate feet above heart, compression hose and Lasix prn         I have discontinued Ms. Fessenden's TGT ALL DAY ALLERGY RELIEF, albuterol, montelukast, and hyoscyamine. I am also having her maintain her fluticasone, losartan, omeprazole, and metoprolol succinate.  Meds ordered this encounter  Medications  . metoprolol succinate (TOPROL-XL) 25 MG 24 hr tablet    Sig: Take 1 tablet (25 mg total) by mouth daily.    Dispense:  90 tablet    Refill:  1    CMA served as Education administrator during this visit. History, Physical and Plan performed by medical provider. Documentation and orders reviewed and attested to.  Penni Homans, MD

## 2017-07-17 ENCOUNTER — Encounter: Payer: Self-pay | Admitting: Family Medicine

## 2017-07-18 ENCOUNTER — Encounter: Payer: Self-pay | Admitting: Family Medicine

## 2017-07-19 ENCOUNTER — Encounter: Payer: Self-pay | Admitting: Family Medicine

## 2017-07-23 ENCOUNTER — Encounter: Payer: Self-pay | Admitting: Family Medicine

## 2017-09-13 ENCOUNTER — Encounter: Payer: Self-pay | Admitting: Family Medicine

## 2017-11-11 ENCOUNTER — Other Ambulatory Visit: Payer: Self-pay | Admitting: Family Medicine

## 2017-12-15 ENCOUNTER — Other Ambulatory Visit: Payer: Self-pay | Admitting: Family Medicine

## 2018-01-11 ENCOUNTER — Encounter: Payer: Self-pay | Admitting: Family Medicine

## 2018-01-11 ENCOUNTER — Ambulatory Visit (INDEPENDENT_AMBULATORY_CARE_PROVIDER_SITE_OTHER): Payer: PRIVATE HEALTH INSURANCE | Admitting: Family Medicine

## 2018-01-11 VITALS — BP 126/90 | HR 80 | Temp 97.5°F | Resp 18 | Ht 69.0 in | Wt 373.4 lb

## 2018-01-11 DIAGNOSIS — L989 Disorder of the skin and subcutaneous tissue, unspecified: Secondary | ICD-10-CM | POA: Diagnosis not present

## 2018-01-11 DIAGNOSIS — E871 Hypo-osmolality and hyponatremia: Secondary | ICD-10-CM | POA: Diagnosis not present

## 2018-01-11 DIAGNOSIS — Z1239 Encounter for other screening for malignant neoplasm of breast: Secondary | ICD-10-CM

## 2018-01-11 DIAGNOSIS — G4733 Obstructive sleep apnea (adult) (pediatric): Secondary | ICD-10-CM | POA: Diagnosis not present

## 2018-01-11 DIAGNOSIS — I10 Essential (primary) hypertension: Secondary | ICD-10-CM

## 2018-01-11 DIAGNOSIS — R600 Localized edema: Secondary | ICD-10-CM | POA: Diagnosis not present

## 2018-01-11 DIAGNOSIS — E782 Mixed hyperlipidemia: Secondary | ICD-10-CM

## 2018-01-11 DIAGNOSIS — E669 Obesity, unspecified: Secondary | ICD-10-CM

## 2018-01-11 DIAGNOSIS — Z1231 Encounter for screening mammogram for malignant neoplasm of breast: Secondary | ICD-10-CM | POA: Diagnosis not present

## 2018-01-11 MED ORDER — FUROSEMIDE 20 MG PO TABS: 20.0000 mg | ORAL_TABLET | Freq: Every day | ORAL | 3 refills | Status: DC | PRN

## 2018-01-11 MED ORDER — METOPROLOL SUCCINATE ER 25 MG PO TB24: 25.0000 mg | ORAL_TABLET | Freq: Every day | ORAL | 1 refills | Status: DC

## 2018-01-11 MED ORDER — TRIAMCINOLONE ACETONIDE 0.025 % EX OINT: 1.0000 "application " | TOPICAL_OINTMENT | Freq: Two times a day (BID) | CUTANEOUS | 1 refills | Status: DC | PRN

## 2018-01-11 NOTE — Assessment & Plan Note (Signed)
Mouth breather so has a full face

## 2018-01-11 NOTE — Assessment & Plan Note (Addendum)
And a dark line on 2nd fingernail for over a year.  Referred to dermatology for further consideration

## 2018-01-11 NOTE — Patient Instructions (Addendum)
SoClean machine to clean the machine [employer] Astringent cleanse twice daily.  Lamisil cream twice a day.  Call insurance and ask if they pay for screening mammogram then if yes call [facility] imaging breast center Also will they pay for check up labs or labs for h/o hypertension and hyperlipidemia Hypertension Hypertension is another name for high blood pressure. High blood pressure forces your heart to work harder to pump blood. This can cause problems over time. There are two numbers in a blood pressure reading. There is a top number (systolic) over a bottom number (diastolic). It is best to have a blood pressure below 120/80. Healthy choices can help lower your blood pressure. You may need medicine to help lower your blood pressure if:  Your blood pressure cannot be lowered with healthy choices.  Your blood pressure is higher than 130/80.  Follow these instructions at home: Eating and drinking  If directed, follow the DASH eating plan. This diet includes: ? Filling half of your plate at each meal with fruits and vegetables. ? Filling one quarter of your plate at each meal with whole grains. Whole grains include whole wheat pasta, brown rice, and whole grain bread. ? Eating or drinking low-fat dairy products, such as skim milk or low-fat yogurt. ? Filling one quarter of your plate at each meal with low-fat (lean) proteins. Low-fat proteins include fish, skinless chicken, eggs, beans, and tofu. ? Avoiding fatty meat, cured and processed meat, or chicken with skin. ? Avoiding premade or processed food.  Eat less than 1,500 mg of salt (sodium) a day.  Limit alcohol use to no more than 1 drink a day for nonpregnant women and 2 drinks a day for men. One drink equals 12 oz of beer, 5 oz of wine, or 1 oz of hard liquor. Lifestyle  Work with your doctor to stay at a healthy weight or to lose weight. Ask your doctor what the best weight is for you.  Get at least 30 minutes of exercise  that causes your heart to beat faster (aerobic exercise) most days of the week. This may include walking, swimming, or biking.  Get at least 30 minutes of exercise that strengthens your muscles (resistance exercise) at least 3 days a week. This may include lifting weights or pilates.  Do not use any products that contain nicotine or tobacco. This includes cigarettes and e-cigarettes. If you need help quitting, ask your doctor.  Check your blood pressure at home as told by your doctor.  Keep all follow-up visits as told by your doctor. This is important. Medicines  Take over-the-counter and prescription medicines only as told by your doctor. Follow directions carefully.  Do not skip doses of blood pressure medicine. The medicine does not work as well if you skip doses. Skipping doses also puts you at risk for problems.  Ask your doctor about side effects or reactions to medicines that you should watch for. Contact a doctor if:  You think you are having a reaction to the medicine you are taking.  You have headaches that keep coming back (recurring).  You feel dizzy.  You have swelling in your ankles.  You have trouble with your vision. Get help right away if:  You get a very bad headache.  You start to feel confused.  You feel weak or numb.  You feel faint.  You get very bad pain in your: ? Chest. ? Belly (abdomen).  You throw up (vomit) more than once.  You have  trouble breathing. Summary  Hypertension is another name for high blood pressure.  Making healthy choices can help lower blood pressure. If your blood pressure cannot be controlled with healthy choices, you may need to take medicine. This information is not intended to replace advice given to you by your health care provider. Make sure you discuss any questions you have with your health care provider. Document Released: 05/16/2008 Document Revised: 10/26/2016 Document Reviewed: 10/26/2016 Elsevier Interactive  Patient Education  2018 [employer].  DASH Eating Plan DASH stands for "Dietary Approaches to Stop Hypertension." The DASH eating plan is a healthy eating plan that has been shown to reduce high blood pressure (hypertension). It may also reduce your risk for type 2 diabetes, heart disease, and stroke. The DASH eating plan may also help with weight loss. What are tips for following this plan? General guidelines Avoid eating more than 2,300 mg (milligrams) of salt (sodium) a day. If you have hypertension, you may need to reduce your sodium intake to 1,500 mg a day. Limit alcohol intake to no more than 1 drink a day for nonpregnant women and 2 drinks a day for men. One drink equals 12 oz of beer, 5 oz of wine, or 1 oz of hard liquor. Work with your health care provider to maintain a healthy body weight or to lose weight. Ask what an ideal weight is for you. Get at least 30 minutes of exercise that causes your heart to beat faster (aerobic exercise) most days of the week. Activities may include walking, swimming, or biking. Work with your health care provider or diet and nutrition specialist (dietitian) to adjust your eating plan to your individual calorie needs. Reading food labels Check food labels for the amount of sodium per serving. Choose foods with less than 5 percent of the Daily Value of sodium. Generally, foods with less than 300 mg of sodium per serving fit into this eating plan. To find whole grains, look for the word "whole" as the first word in the ingredient list. Shopping Buy products labeled as "low-sodium" or "no salt added." Buy fresh foods. Avoid canned foods and premade or frozen meals. Cooking Avoid adding salt when cooking. Use salt-free seasonings or herbs instead of table salt or sea salt. Check with your health care provider or pharmacist before using salt substitutes. Do not fry foods. Cook foods using healthy methods such as baking, boiling, grilling, and broiling  instead. Cook with heart-healthy oils, such as olive, canola, soybean, or sunflower oil. Meal planning  Eat a balanced diet that includes: 5 or more servings of fruits and vegetables each day. At each meal, try to fill half of your plate with fruits and vegetables. Up to 6-8 servings of whole grains each day. Less than 6 oz of lean meat, poultry, or fish each day. A 3-oz serving of meat is about the same size as a deck of cards. One egg equals 1 oz. 2 servings of low-fat dairy each day. A serving of nuts, seeds, or beans 5 times each week. Heart-healthy fats. Healthy fats called Omega-3 fatty acids are found in foods such as flaxseeds and coldwater fish, like sardines, salmon, and mackerel. Limit how much you eat of the following: Canned or prepackaged foods. Food that is high in trans fat, such as fried foods. Food that is high in saturated fat, such as fatty meat. Sweets, desserts, sugary drinks, and other foods with added sugar. Full-fat dairy products. Do not salt foods before eating. Try to eat  at least 2 vegetarian meals each week. Eat more home-cooked food and less restaurant, buffet, and fast food. When eating at a restaurant, ask that your food be prepared with less salt or no salt, if possible. What foods are recommended? The items listed may not be a complete list. Talk with your dietitian about what dietary choices are best for you. Grains Whole-grain or whole-wheat bread. Whole-grain or whole-wheat pasta. Brown rice. Orpah Cobb. Bulgur. Whole-grain and low-sodium cereals. Pita bread. Low-fat, low-sodium crackers. Whole-wheat flour tortillas. Vegetables Fresh or frozen vegetables (raw, steamed, roasted, or grilled). Low-sodium or reduced-sodium tomato and vegetable juice. Low-sodium or reduced-sodium tomato sauce and tomato paste. Low-sodium or reduced-sodium canned vegetables. Fruits All fresh, dried, or frozen fruit. Canned fruit in natural juice (without added  sugar). Meat and other protein foods Skinless chicken or [place]. Ground chicken or [place]. Pork with fat trimmed off. Fish and seafood. Egg whites. Dried beans, peas, or lentils. Unsalted nuts, nut butters, and seeds. Unsalted canned beans. Lean cuts of beef with fat trimmed off. Low-sodium, lean deli meat. Dairy Low-fat (1%) or fat-free (skim) milk. Fat-free, low-fat, or reduced-fat cheeses. Nonfat, low-sodium ricotta or cottage cheese. Low-fat or nonfat yogurt. Low-fat, low-sodium cheese. Fats and oils Soft margarine without trans fats. Vegetable oil. Low-fat, reduced-fat, or light mayonnaise and salad dressings (reduced-sodium). Canola, safflower, olive, soybean, and sunflower oils. Avocado. Seasoning and other foods Herbs. Spices. Seasoning mixes without salt. Unsalted popcorn and pretzels. Fat-free sweets. What foods are not recommended? The items listed may not be a complete list. Talk with your dietitian about what dietary choices are best for you. Grains Baked goods made with fat, such as croissants, muffins, or some breads. Dry pasta or rice meal packs. Vegetables Creamed or fried vegetables. Vegetables in a cheese sauce. Regular canned vegetables (not low-sodium or reduced-sodium). Regular canned tomato sauce and paste (not low-sodium or reduced-sodium). Regular tomato and vegetable juice (not low-sodium or reduced-sodium). Rosita Fire. Olives. Fruits Canned fruit in a light or heavy syrup. Fried fruit. Fruit in cream or butter sauce. Meat and other protein foods Fatty cuts of meat. Ribs. Fried meat. Tomasa Blase. Sausage. Bologna and other processed lunch meats. Salami. Fatback. Hotdogs. Bratwurst. Salted nuts and seeds. Canned beans with added salt. Canned or smoked fish. Whole eggs or egg yolks. Chicken or [place] with skin. Dairy Whole or 2% milk, cream, and half-and-half. Whole or full-fat cream cheese. Whole-fat or sweetened yogurt. Full-fat cheese. Nondairy creamers. Whipped toppings.  Processed cheese and cheese spreads. Fats and oils Butter. Stick margarine. Lard. Shortening. Ghee. Bacon fat. Tropical oils, such as coconut, palm kernel, or palm oil. Seasoning and other foods Salted popcorn and pretzels. Onion salt, garlic salt, seasoned salt, table salt, and sea salt. Worcestershire sauce. Tartar sauce. Barbecue sauce. Teriyaki sauce. Soy sauce, including reduced-sodium. Steak sauce. Canned and packaged gravies. Fish sauce. Oyster sauce. Cocktail sauce. Horseradish that you find on the shelf. Ketchup. Mustard. Meat flavorings and tenderizers. Bouillon cubes. Hot sauce and Tabasco sauce. Premade or packaged marinades. Premade or packaged taco seasonings. Relishes. Regular salad dressings. Where to find more information: National Heart, Lung, and Blood Institute: [employer]Ste[employer].is American Heart Association: www.heart.org Summary The DASH eating plan is a healthy eating plan that has been shown to reduce high blood pressure (hypertension). It may also reduce your risk for type 2 diabetes, heart disease, and stroke. With the DASH eating plan, you should limit salt (sodium) intake to 2,300 mg a day. If you have hypertension, you may need to reduce your  sodium intake to 1,500 mg a day. When on the DASH eating plan, aim to eat more fresh fruits and vegetables, whole grains, lean proteins, low-fat dairy, and heart-healthy fats. Work with your health care provider or diet and nutrition specialist (dietitian) to adjust your eating plan to your individual calorie needs. This information is not intended to replace advice given to you by your health care provider. Make sure you discuss any questions you have with your health care provider. Document Released: 11/17/2011 Document Revised: 11/21/2016 Document Reviewed: 11/21/2016 Elsevier Interactive Patient Education  [employer]2018 [employer] Inc.

## 2018-01-11 NOTE — Assessment & Plan Note (Signed)
Encouraged heart healthy diet, increase exercise, avoid trans fats, consider a krill oil cap daily 

## 2018-01-11 NOTE — Progress Notes (Signed)
Subjective:  I acted as a Education administrator for Dr. Charlett Blake. [place], [place]  Patient ID: Christina Mendez, female    DOB: [birth year], 42 y.o.   MRN: [id number]  No chief complaint on file.   HPI  Patient is in today for 6 month follow up and overall she is doing well. No recent febrile illness or hospitalizations. No polyuria or polydipsia. Denies CP/palp/SOB/HA/congestion/fevers/GI or GU c/o. Taking meds as prescribed  Patient Care Team: Mosie Lukes, MD as PCP - General (Family Medicine)   Past Medical History:  Diagnosis Date  . Allergic state 03/02/2012  . Allergy last couple of years   seasonal  . Cervical cancer screening 03/02/2012  . Chicken pox 3rd grade  . GERD (gastroesophageal reflux disease)   . Heartburn   . HTN (hypertension) 12/09/2011  . Hyperlipidemia   . Hypertension   . Inflamed skin tag 03/02/2012  . Menorrhagia 05/04/2015  . Obese 12/09/2011  . OSA (obstructive sleep apnea)   . Ovarian cyst 08/17/2012  . Pedal edema 07/11/2017  . Sinusitis 01/08/2012    Past Surgical History:  Procedure Laterality Date  . cyst removed  42 yr old   benign, abdominal    Family History  Problem Relation Age of Onset  . Fibromyalgia Mother   . Diabetes Mother        type 2  . Hypertension Mother   . Diabetes Father        type 2  . Heart disease Father 66       2 MI  . Hypertension Father   . Heart attack Father 23       X 2  . Stroke Father        mini strokes  . GER disease Father   . Hyperlipidemia Father   . Cholelithiasis Father   . Venous thrombosis Father        in liver  . Hypertension Brother   . Venous thrombosis Brother        on spine  . Cancer Maternal Grandfather        stomach  . Hypertension Paternal Grandmother   . Diabetes Paternal Grandmother        type 2  . Heart attack Paternal Grandmother   . Heart disease Paternal Grandmother   . Heart attack Paternal Grandfather   . Heart disease Paternal Grandfather   . Hypertension Paternal  Grandfather   . Cancer Maternal Uncle        prostate cancer    Social History   Socioeconomic History  . Marital status: Single    Spouse name: Not on file  . Number of children: Not on file  . Years of education: Not on file  . Highest education level: Not on file  Social Needs  . Financial resource strain: Not on file  . Food insecurity - worry: Not on file  . Food insecurity - inability: Not on file  . Transportation needs - medical: Not on file  . Transportation needs - non-medical: Not on file  Occupational History  . Occupation: Pharmacist, hospital   Tobacco Use  . Smoking status: Never Smoker  . Smokeless tobacco: Never Used  Substance and Sexual Activity  . Alcohol use: Yes    Comment: OCCASIONALLY  . Drug use: No  . Sexual activity: Yes    Partners: Male    Comment: No dietary restrictions, lives with Mom, Dad and brother and niece.  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  Not on file  Social History Narrative  . Not on file    Outpatient Medications Prior to Visit  Medication Sig Dispense Refill  . losartan (COZAAR) 25 MG tablet TAKE 1 TABLET BY MOUTH ONCE DAILY 90 tablet 0  . omeprazole (PRILOSEC) 40 MG capsule TAKE 1 CAPSULE BY MOUTH ONCE DAILY 90 capsule 0  . furosemide (LASIX) 20 MG tablet Take 1 tablet (20 mg total) by mouth daily as needed. 30 tablet 3  . metoprolol succinate (TOPROL-XL) 25 MG 24 hr tablet Take 1 tablet (25 mg total) by mouth daily. 90 tablet 1  . fluticasone (FLONASE) 50 MCG/ACT nasal spray Place 2 sprays into the nose daily as needed for rhinitis or allergies. 16 g 2   No facility-administered medications prior to visit.     Allergies  Allergen Reactions  . Skelaxin Hypertension    Review of Systems  Constitutional: Negative for fever and malaise/fatigue.  HENT: Negative for congestion.   Eyes: Negative for blurred vision.  Respiratory: Negative for cough and shortness of breath.   Cardiovascular: Negative for chest pain,  palpitations and leg swelling.  Gastrointestinal: Negative for vomiting.  Musculoskeletal: Negative for back pain.  Skin: Positive for rash.  Neurological: Negative for loss of consciousness and headaches.       Objective:    Physical Exam  Constitutional: She is oriented to person, place, and time. She appears well-developed and well-nourished. No distress.  HENT:  Head: Normocephalic and atraumatic.  Eyes: Conjunctivae are normal.  Neck: Normal range of motion. No thyromegaly present.  Cardiovascular: Normal rate and regular rhythm.  Pulmonary/Chest: Effort normal and breath sounds normal. She has no wheezes.  Abdominal: Soft. Bowel sounds are normal. There is no tenderness.  Musculoskeletal: Normal range of motion. She exhibits no edema or deformity.  Neurological: She is alert and oriented to person, place, and time.  Skin: Skin is warm and dry. She is not diaphoretic.  Dark line on 2nd right fingernail.  Psychiatric: She has a normal mood and affect.    BP 126/90 (BP Location: Left Arm, Patient Position: Sitting, Cuff Size: Normal)   Pulse 80   Temp (!) 97.5 F (36.4 C) (Oral)   Resp 18   Ht _0  (1.753 m)   Wt (!) 373 lb 6.4 oz (169.4 kg)   SpO2 98%   BMI 55.14 kg/m  Wt Readings from Last 3 Encounters:  01/11/18 (!) 373 lb 6.4 oz (169.4 kg)  07/11/17 (!) 361 lb 3.2 oz (163.8 kg)  05/06/16 (!) 346 lb (156.9 kg)   BP Readings from Last 3 Encounters:  01/11/18 126/90  07/11/17 130/86  05/06/16 122/70     Immunization History  Administered Date(s) Administered  . DTaP 03/03/1975, 04/02/1975, 05/02/1975, 01/01/1979, 02/13/1981  . Hepatitis B, adult 11/30/2015, 01/01/2016  . IPV 03/03/1975, 04/02/1975, 05/02/1975, 01/01/1979, 02/13/1981  . Influenza Split 12/09/2011, 08/17/2012  . Influenza, Seasonal, Injecte, Preservative Fre 09/05/2015  . Influenza,inj,Quad PF,6+ Mos 08/21/2014  . Influenza-Unspecified 09/11/2013  . MMR 03/26/1976, 12/23/2010  . PPD Test  12/21/2011, 03/31/2014, 11/27/2015  . Td 02/20/2003, 03/31/2014  . Tdap 02/20/2003    Health Maintenance  Topic Date Due  . HIV Screening  01/01/1990  . PAP SMEAR  03/31/2017  . INFLUENZA VACCINE  07/12/2017  . TETANUS/TDAP  03/31/2024    Lab Results  Component Value Date   WBC 5.6 05/04/2015   HGB 14.0 05/04/2015   HCT 41.3 05/04/2015   PLT 280.0 05/04/2015   GLUCOSE 83 05/04/2015  CHOL 160 05/04/2015   TRIG 75.0 05/04/2015   HDL 51.30 05/04/2015   LDLCALC 94 05/04/2015   ALT 17 05/04/2015   AST 18 05/04/2015   NA 137 05/04/2015   K 3.7 05/04/2015   CL 104 05/04/2015   CREATININE 0.70 05/04/2015   BUN 8 05/04/2015   CO2 29 05/04/2015   TSH 1.00 05/04/2015    Lab Results  Component Value Date   TSH 1.00 05/04/2015   Lab Results  Component Value Date   WBC 5.6 05/04/2015   HGB 14.0 05/04/2015   HCT 41.3 05/04/2015   MCV 88.1 05/04/2015   PLT 280.0 05/04/2015   Lab Results  Component Value Date   NA 137 05/04/2015   K 3.7 05/04/2015   CO2 29 05/04/2015   GLUCOSE 83 05/04/2015   BUN 8 05/04/2015   CREATININE 0.70 05/04/2015   BILITOT 0.4 05/04/2015   ALKPHOS 57 05/04/2015   AST 18 05/04/2015   ALT 17 05/04/2015   PROT 7.2 05/04/2015   ALBUMIN 4.1 05/04/2015   CALCIUM 8.9 05/04/2015   GFR 118.99 05/04/2015   Lab Results  Component Value Date   CHOL 160 05/04/2015   Lab Results  Component Value Date   HDL 51.30 05/04/2015   Lab Results  Component Value Date   LDLCALC 94 05/04/2015   Lab Results  Component Value Date   TRIG 75.0 05/04/2015   Lab Results  Component Value Date   CHOLHDL 3 05/04/2015   No results found for: HGBA1C       Assessment & Plan:   Problem List Items Addressed This Visit    HTN (hypertension)    Well controlled, no changes to meds. Encouraged heart healthy diet such as the DASH diet and exercise as tolerated.       Relevant Medications   metoprolol succinate (TOPROL-XL) 25 MG 24 hr tablet   furosemide  (LASIX) 20 MG tablet   Obese    Encouraged DASH diet, decrease po intake and increase exercise as tolerated. Needs 7-8 hours of sleep nightly. Avoid trans fats, eat small, frequent meals every 4-5 hours with lean proteins, complex carbs and healthy fats. Minimize simple carbs      OSA (obstructive sleep apnea)    Mouth breather so has a full face       Hyperlipidemia, mixed    Encouraged heart healthy diet, increase exercise, avoid trans fats, consider a krill oil cap daily      Relevant Medications   metoprolol succinate (TOPROL-XL) 25 MG 24 hr tablet   furosemide (LASIX) 20 MG tablet   Hyponatremia    Mild, intermittent, asymptomatic      Pedal edema   Relevant Medications   furosemide (LASIX) 20 MG tablet   Skin lesion of scalp    And a dark line on 2nd fingernail for over a year.  Referred to dermatology for further consideration      Relevant Orders   Ambulatory referral to Dermatology    Other Visit Diagnoses    Breast cancer screening    -  Primary   Relevant Orders   MM DIGITAL SCREENING BILATERAL      I have discontinued Marguerita L. Schwall's fluticasone. I am also having her start on triamcinolone. Additionally, I am having her maintain her omeprazole, losartan, metoprolol succinate, and furosemide.  Meds ordered this encounter  Medications  . metoprolol succinate (TOPROL-XL) 25 MG 24 hr tablet    Sig: Take 1 tablet (25 mg total) by mouth  daily.    Dispense:  90 tablet    Refill:  1  . furosemide (LASIX) 20 MG tablet    Sig: Take 1 tablet (20 mg total) by mouth daily as needed.    Dispense:  30 tablet    Refill:  3  . triamcinolone (KENALOG) 0.025 % ointment    Sig: Apply 1 application topically 2 (two) times daily as needed.    Dispense:  30 g    Refill:  1    CMA served as scribe during this visit. History, Physical and Plan performed by medical provider. Documentation and orders reviewed and attested to.  Penni Homans, MD

## 2018-01-11 NOTE — Assessment & Plan Note (Signed)
Encouraged DASH diet, decrease po intake and increase exercise as tolerated. Needs 7-8 hours of sleep nightly. Avoid trans fats, eat small, frequent meals every 4-5 hours with lean proteins, complex carbs and healthy fats. Minimize simple carbs 

## 2018-01-11 NOTE — Assessment & Plan Note (Signed)
Well controlled, no changes to meds. Encouraged heart healthy diet such as the DASH diet and exercise as tolerated.  °

## 2018-01-14 NOTE — Assessment & Plan Note (Signed)
Mild, intermittent, asymptomatic

## 2018-01-22 ENCOUNTER — Other Ambulatory Visit: Payer: Self-pay | Admitting: Family Medicine

## 2018-01-22 ENCOUNTER — Encounter: Payer: Self-pay | Admitting: Family Medicine

## 2018-01-22 MED ORDER — TIZANIDINE HCL 4 MG PO TABS: 4.0000 mg | ORAL_TABLET | Freq: Three times a day (TID) | ORAL | 1 refills | Status: DC | PRN

## 2018-01-24 ENCOUNTER — Encounter: Payer: Self-pay | Admitting: Family Medicine

## 2018-01-24 MED ORDER — TIZANIDINE HCL 4 MG PO TABS: 4.0000 mg | ORAL_TABLET | Freq: Three times a day (TID) | ORAL | 1 refills | Status: DC | PRN

## 2018-05-09 ENCOUNTER — Other Ambulatory Visit: Payer: Self-pay | Admitting: Family Medicine

## 2018-07-09 ENCOUNTER — Other Ambulatory Visit: Payer: Self-pay | Admitting: Family Medicine

## 2018-07-09 DIAGNOSIS — R600 Localized edema: Secondary | ICD-10-CM

## 2018-07-13 ENCOUNTER — Encounter: Payer: Self-pay | Admitting: Family Medicine

## 2018-07-19 ENCOUNTER — Ambulatory Visit (INDEPENDENT_AMBULATORY_CARE_PROVIDER_SITE_OTHER): Payer: Self-pay | Admitting: Family Medicine

## 2018-07-19 ENCOUNTER — Encounter: Payer: Self-pay | Admitting: Family Medicine

## 2018-07-19 DIAGNOSIS — Z124 Encounter for screening for malignant neoplasm of cervix: Secondary | ICD-10-CM

## 2018-07-19 DIAGNOSIS — I10 Essential (primary) hypertension: Secondary | ICD-10-CM

## 2018-07-19 DIAGNOSIS — Z0000 Encounter for general adult medical examination without abnormal findings: Secondary | ICD-10-CM

## 2018-07-19 DIAGNOSIS — R0989 Other specified symptoms and signs involving the circulatory and respiratory systems: Secondary | ICD-10-CM

## 2018-07-19 DIAGNOSIS — R09A2 Foreign body sensation, throat: Secondary | ICD-10-CM

## 2018-07-19 DIAGNOSIS — E782 Mixed hyperlipidemia: Secondary | ICD-10-CM

## 2018-07-19 DIAGNOSIS — F458 Other somatoform disorders: Secondary | ICD-10-CM

## 2018-07-19 LAB — COMPREHENSIVE METABOLIC PANEL
ALBUMIN: 4.1 g/dL (ref 3.5–5.2)
ALT: 16 U/L (ref 0–35)
AST: 14 U/L (ref 0–37)
Alkaline Phosphatase: 56 U/L (ref 39–117)
BILIRUBIN TOTAL: 0.5 mg/dL (ref 0.2–1.2)
BUN: 10 mg/dL (ref 6–23)
CALCIUM: 9.5 mg/dL (ref 8.4–10.5)
CO2: 30 mEq/L (ref 19–32)
CREATININE: 0.77 mg/dL (ref 0.40–1.20)
Chloride: 101 mEq/L (ref 96–112)
GFR: 104.95 mL/min (ref >60.00)
Glucose, Bld: 96 mg/dL (ref 70–99)
Potassium: 4 mEq/L (ref 3.5–5.1)
Sodium: 136 mEq/L (ref 135–145)
TOTAL PROTEIN: 7.3 g/dL (ref 6.0–8.3)

## 2018-07-19 LAB — TSH: TSH: 0.56 u[IU]/mL (ref 0.35–4.50)

## 2018-07-19 MED ORDER — CYCLOBENZAPRINE HCL 10 MG PO TABS: 10.0000 mg | ORAL_TABLET | Freq: Three times a day (TID) | ORAL | 3 refills | Status: DC | PRN

## 2018-07-19 NOTE — Assessment & Plan Note (Signed)
[facility]

## 2018-07-19 NOTE — Patient Instructions (Addendum)
Hydration 64 to 84 oz, balance the coffee  Allergies, try Cetirizine 10 mg daily downstairs  Heartburn, silent at night. Zantac/Ranitidine 150 mg at bed  CoverMyMeds or GoodRx  DASH or MIND or Satiety diets Preventive Care 18-39 Years, Female Preventive care refers to lifestyle choices and visits with your health care provider that can promote health and wellness. What does preventive care include?  A yearly physical exam. This is also called an annual well check.  Dental exams once or twice a year.  Routine eye exams. Ask your health care provider how often you should have your eyes checked.  Personal lifestyle choices, including: ? Daily care of your teeth and gums. ? Regular physical activity. ? Eating a healthy diet. ? Avoiding tobacco and drug use. ? Limiting alcohol use. ? Practicing safe sex. ? Taking vitamin and mineral supplements as recommended by your health care provider. What happens during an annual well check? The services and screenings done by your health care provider during your annual well check will depend on your age, overall health, lifestyle risk factors, and family history of disease. Counseling Your health care provider may ask you questions about your:  Alcohol use.  Tobacco use.  Drug use.  Emotional well-being.  Home and relationship well-being.  Sexual activity.  Eating habits.  Work and work Statistician.  Method of birth control.  Menstrual cycle.  Pregnancy history.  Screening You may have the following tests or measurements:  Height, weight, and BMI.  Diabetes screening. This is done by checking your blood sugar (glucose) after you have not eaten for a while (fasting).  Blood pressure.  Lipid and cholesterol levels. These may be checked every 5 years starting at age 49.  Skin check.  Hepatitis C blood test.  Hepatitis B blood test.  Sexually transmitted disease (STD) testing.  BRCA-related cancer screening. This  may be done if you have a family history of breast, ovarian, tubal, or peritoneal cancers.  Pelvic exam and Pap test. This may be done every 3 years starting at age 84. Starting at age 62, this may be done every 5 years if you have a Pap test in combination with an HPV test.  Discuss your test results, treatment options, and if necessary, the need for more tests with your health care provider. Vaccines Your health care provider may recommend certain vaccines, such as:  Influenza vaccine. This is recommended every year.  Tetanus, diphtheria, and acellular pertussis (Tdap, Td) vaccine. You may need a Td booster every 10 years.  Varicella vaccine. You may need this if you have not been vaccinated.  HPV vaccine. If you are 7 or younger, you may need three doses over 6 months.  Measles, mumps, and rubella (MMR) vaccine. You may need at least one dose of MMR. You may also need a second dose.  Pneumococcal 13-valent conjugate (PCV13) vaccine. You may need this if you have certain conditions and were not previously vaccinated.  Pneumococcal polysaccharide (PPSV23) vaccine. You may need one or two doses if you smoke cigarettes or if you have certain conditions.  Meningococcal vaccine. One dose is recommended if you are age 49-21 years and a first-year college student living in a residence hall, or if you have one of several medical conditions. You may also need additional booster doses.  Hepatitis A vaccine. You may need this if you have certain conditions or if you travel or work in places where you may be exposed to hepatitis A.  Hepatitis B vaccine.  You may need this if you have certain conditions or if you travel or work in places where you may be exposed to hepatitis B.  Haemophilus influenzae type b (Hib) vaccine. You may need this if you have certain risk factors.  Talk to your health care provider about which screenings and vaccines you need and how often you need them. This  information is not intended to replace advice given to you by your health care provider. Make sure you discuss any questions you have with your health care provider. Document Released: 01/24/2002 Document Revised: 08/17/2016 Document Reviewed: 09/29/2015 Elsevier Interactive Patient Education  [employer].

## 2018-07-19 NOTE — Progress Notes (Signed)
Subjective:  I acted as a Education administrator for Dr. Charlett Blake. [place], [place]  Patient ID: Christina Mendez, female    DOB: 06-10-1976, 42 y.o.   MRN: [id number]  No chief complaint on file.   HPI  Patient is in today for an annual exam. She has no acute concerns. She notes a sense of globus recently. Occurs most days. No dysphagia or pain associated. Notes some nasal congestion as well. Had a [facility] smea at [facility]. Well controlled, no changes to meds. Encouraged heart healthy diet such as the DASH diet and exercise as tolerated.   Patient Care Team: Mosie Lukes, MD as PCP - General (Family Medicine)   Past Medical History:  Diagnosis Date  . Allergic state 03/02/2012  . Allergy last couple of years   seasonal  . Cervical cancer screening 03/02/2012  . Chicken pox 3rd grade  . GERD (gastroesophageal reflux disease)   . Heartburn   . HTN (hypertension) 12/09/2011  . Hyperlipidemia   . Hypertension   . Inflamed skin tag 03/02/2012  . Menorrhagia 05/04/2015  . Obese 12/09/2011  . OSA (obstructive sleep apnea)   . Ovarian cyst 08/17/2012  . Pedal edema 07/11/2017  . Sinusitis 01/08/2012    Past Surgical History:  Procedure Laterality Date  . cyst removed  42 yr old   benign, abdominal    Family History  Problem Relation Age of Onset  . Fibromyalgia Mother   . Diabetes Mother        type 2  . Hypertension Mother   . Diabetes Father        type 2  . Heart disease Father 64       2 MI  . Hypertension Father   . Heart attack Father 23       X 2  . Stroke Father        mini strokes  . GER disease Father   . Hyperlipidemia Father   . Cholelithiasis Father   . Venous thrombosis Father        in liver  . Hypertension Brother   . Venous thrombosis Brother        on spine  . Cancer Maternal Grandfather        stomach  . Hypertension Paternal Grandmother   . Diabetes Paternal Grandmother        type 2  . Heart attack Paternal Grandmother   . Heart disease Paternal  Grandmother   . Heart attack Paternal Grandfather   . Heart disease Paternal Grandfather   . Hypertension Paternal Grandfather   . Cancer Maternal Uncle        prostate cancer    Social History   Socioeconomic History  . Marital status: Single    Spouse name: Not on file  . Number of children: Not on file  . Years of education: Not on file  . Highest education level: Not on file  Occupational History  . Occupation: Pharmacist, hospital   Social Needs  . Financial resource strain: Not on file  . Food insecurity:    Worry: Not on file    Inability: Not on file  . Transportation needs:    Medical: Not on file    Non-medical: Not on file  Tobacco Use  . Smoking status: Never Smoker  . Smokeless tobacco: Never Used  Substance and Sexual Activity  . Alcohol use: Yes    Comment: OCCASIONALLY  . Drug use: No  . Sexual activity: Yes  Partners: Male    Comment: No dietary restrictions, lives with Mom, Dad and brother and niece.  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 Physical activity:    Days per week: Not on file    Minutes per session: Not on file  . Stress: Not on file  Relationships  . Social connections:    Talks on phone: Not on file    Gets together: Not on file    Attends religious service: Not on file    Active member of club or organization: Not on file    Attends meetings of clubs or organizations: Not on file    Relationship status: Not on file  . Intimate partner violence:    Fear of current or ex partner: Not on file    Emotionally abused: Not on file    Physically abused: Not on file    Forced sexual activity: Not on file  Other Topics Concern  . Not on file  Social History Narrative  . Not on file    Outpatient Medications Prior to Visit  Medication Sig Dispense Refill  . furosemide (LASIX) 20 MG tablet TAKE ONE TABLET BY MOUTH DAILY AS NEEDED  30 tablet 2  . losartan (COZAAR) 25 MG tablet take 1 tablet by mouth once daily 90 tablet 0  . metoprolol  succinate (TOPROL-XL) 25 MG 24 hr tablet TAKE ONE TABLET BY MOUTH ONE TIME DAILY  90 tablet 0  . omeprazole (PRILOSEC) 40 MG capsule take 1 capsule by mouth once daily 90 capsule 0  . triamcinolone (KENALOG) 0.025 % ointment Apply 1 application topically 2 (two) times daily as needed. 30 g 1  . tiZANidine (ZANAFLEX) 4 MG tablet Take 1 tablet (4 mg total) by mouth every 8 (eight) hours as needed for muscle spasms. 30 tablet 1   No facility-administered medications prior to visit.     Allergies  Allergen Reactions  . Skelaxin Hypertension    Review of Systems  Constitutional: Positive for malaise/fatigue. Negative for chills and fever.  HENT: Positive for congestion. Negative for hearing loss.   Eyes: Negative for discharge.  Respiratory: Negative for cough, sputum production and shortness of breath.   Cardiovascular: Negative for chest pain, palpitations and leg swelling.  Gastrointestinal: Negative for abdominal pain, blood in stool, constipation, diarrhea, heartburn, nausea and vomiting.  Genitourinary: Negative for dysuria, frequency, hematuria and urgency.  Musculoskeletal: Negative for back pain, falls and myalgias.  Skin: Negative for rash.  Neurological: Negative for dizziness, sensory change, loss of consciousness, weakness and headaches.  Endo/Heme/Allergies: Negative for environmental allergies. Does not bruise/bleed easily.  Psychiatric/Behavioral: Negative for depression and suicidal ideas. The patient is not nervous/anxious and does not have insomnia.        Objective:    Physical Exam  Constitutional: She is oriented to person, place, and time. She appears well-developed and well-nourished. No distress.  HENT:  Head: Normocephalic and atraumatic.  Eyes: Conjunctivae are normal.  Neck: Neck supple. No thyromegaly present.  Cardiovascular: Normal rate, regular rhythm and normal heart sounds.  No murmur heard. Pulmonary/Chest: Effort normal and breath sounds normal.  No respiratory distress.  Abdominal: Soft. Bowel sounds are normal. She exhibits no distension and no mass. There is no tenderness.  Musculoskeletal: She exhibits no edema.  Lymphadenopathy:    She has no cervical adenopathy.  Neurological: She is alert and oriented to person, place, and time.  Skin: Skin is warm and dry.  Psychiatric: She has a normal mood and affect.  Her behavior is normal.    BP 133/73 (BP Location: Left Arm, Patient Position: Sitting, Cuff Size: Normal)   Pulse (!) 57   Temp 98.2 F (36.8 C) (Oral)   Resp 18   Ht '5\' 8"'$  (1.727 m)   Wt (!) 374 lb 9.6 oz (169.9 kg)   SpO2 100%   BMI 56.96 kg/m  Wt Readings from Last 3 Encounters:  07/19/18 (!) 374 lb 9.6 oz (169.9 kg)  01/11/18 (!) 373 lb 6.4 oz (169.4 kg)  07/11/17 (!) 361 lb 3.2 oz (163.8 kg)   BP Readings from Last 3 Encounters:  07/19/18 133/73  01/11/18 126/90  07/11/17 130/86     Immunization History  Administered Date(s) Administered  . DTaP 03/03/1975, 04/02/1975, 05/02/1975, 01/01/1979, 02/13/1981  . Hepatitis B, adult 11/30/2015, 01/01/2016  . IPV 03/03/1975, 04/02/1975, 05/02/1975, 01/01/1979, 02/13/1981  . Influenza Split 12/09/2011, 08/17/2012  . Influenza, Seasonal, Injecte, Preservative Fre 09/05/2015  . Influenza,inj,Quad PF,6+ Mos 08/21/2014  . Influenza-Unspecified 09/11/2013  . MMR 03/26/1976, 12/23/2010  . PPD Test 12/21/2011, 03/31/2014, 11/27/2015  . Td 02/20/2003, 03/31/2014  . Tdap 02/20/2003    Health Maintenance  Topic Date Due  . HIV Screening  01/01/1990  . PAP SMEAR  03/31/2017  . INFLUENZA VACCINE  07/12/2018  . TETANUS/TDAP  03/31/2024    Lab Results  Component Value Date   WBC 5.6 05/04/2015   HGB 14.0 05/04/2015   HCT 41.3 05/04/2015   PLT 280.0 05/04/2015   GLUCOSE 96 07/19/2018   CHOL 160 05/04/2015   TRIG 75.0 05/04/2015   HDL 51.30 05/04/2015   LDLCALC 94 05/04/2015   ALT 16 07/19/2018   AST 14 07/19/2018   NA 136 07/19/2018   K 4.0 07/19/2018    CL 101 07/19/2018   CREATININE 0.77 07/19/2018   BUN 10 07/19/2018   CO2 30 07/19/2018   TSH 0.56 07/19/2018    Lab Results  Component Value Date   TSH 0.56 07/19/2018   Lab Results  Component Value Date   WBC 5.6 05/04/2015   HGB 14.0 05/04/2015   HCT 41.3 05/04/2015   MCV 88.1 05/04/2015   PLT 280.0 05/04/2015   Lab Results  Component Value Date   NA 136 07/19/2018   K 4.0 07/19/2018   CO2 30 07/19/2018   GLUCOSE 96 07/19/2018   BUN 10 07/19/2018   CREATININE 0.77 07/19/2018   BILITOT 0.5 07/19/2018   ALKPHOS 56 07/19/2018   AST 14 07/19/2018   ALT 16 07/19/2018   PROT 7.3 07/19/2018   ALBUMIN 4.1 07/19/2018   CALCIUM 9.5 07/19/2018   GFR 104.95 07/19/2018   Lab Results  Component Value Date   CHOL 160 05/04/2015   Lab Results  Component Value Date   HDL 51.30 05/04/2015   Lab Results  Component Value Date   LDLCALC 94 05/04/2015   Lab Results  Component Value Date   TRIG 75.0 05/04/2015   Lab Results  Component Value Date   CHOLHDL 3 05/04/2015   No results found for: HGBA1C       Assessment & Plan:   Problem List Items Addressed This Visit    HTN (hypertension)    Well controlled, no changes to meds. Encouraged heart healthy diet such as the DASH diet and exercise as tolerated.       Relevant Orders   TSH (Completed)   Comprehensive metabolic panel (Completed)   Preventative health care    Patient encouraged to maintain heart healthy diet, regular exercise, adequate  sleep. Consider daily probiotics. Take medications as prescribed.      Cervical cancer screening    Had a normal pap at [facility]      Hyperlipidemia, mixed    Encouraged heart healthy diet, increase exercise, avoid trans fats, consider a krill oil cap daily      Globus sensation    Likely multifactorial. Encouraged increased hydration, daily antihistamines and H2 blocker. If no improvement then will notify us so she can be referred to ENT for further evaluation          I have discontinued Stephinie L. Authement's tiZANidine. I am also having her start on cyclobenzaprine. Additionally, I am having her maintain her triamcinolone, omeprazole, losartan, metoprolol succinate, and furosemide.  Meds ordered this encounter  Medications  . cyclobenzaprine (FLEXERIL) 10 MG tablet    Sig: Take 1 tablet (10 mg total) by mouth 3 (three) times daily as needed for muscle spasms.    Dispense:  30 tablet    Refill:  3    CMA served as scribe during this visit. History, Physical and Plan performed by medical provider. Documentation and orders reviewed and attested to.  Penni Homans, MD

## 2018-07-19 NOTE — Assessment & Plan Note (Signed)
Well controlled, no changes to meds. Encouraged heart healthy diet such as the DASH diet and exercise as tolerated.  °

## 2018-07-19 NOTE — Assessment & Plan Note (Signed)
Patient encouraged to maintain heart healthy diet, regular exercise, adequate sleep. Consider daily probiotics. Take medications as prescribed 

## 2018-07-20 ENCOUNTER — Telehealth: Payer: Self-pay | Admitting: *Deleted

## 2018-07-20 NOTE — Telephone Encounter (Signed)
[facility] [address]; forwarded to provider/SLS 08/09

## 2018-07-22 DIAGNOSIS — R0989 Other specified symptoms and signs involving the circulatory and respiratory systems: Secondary | ICD-10-CM | POA: Insufficient documentation

## 2018-07-22 NOTE — Assessment & Plan Note (Signed)
Encouraged heart healthy diet, increase exercise, avoid trans fats, consider a krill oil cap daily 

## 2018-07-22 NOTE — Assessment & Plan Note (Signed)
Likely multifactorial. Encouraged increased hydration, daily antihistamines and H2 blocker. If no improvement then will notify us so she can be referred to ENT for further evaluation

## 2018-07-24 ENCOUNTER — Encounter: Payer: Self-pay | Admitting: Family Medicine

## 2018-07-24 LAB — HM PAP SMEAR: HM PAP: NEGATIVE

## 2018-08-09 ENCOUNTER — Other Ambulatory Visit: Payer: Self-pay | Admitting: Family Medicine

## 2018-10-10 ENCOUNTER — Other Ambulatory Visit: Payer: Self-pay | Admitting: Family Medicine

## 2018-11-15 ENCOUNTER — Telehealth: Payer: Self-pay | Admitting: Family Medicine

## 2018-11-15 MED ORDER — FAMOTIDINE 40 MG PO TABS: 40.0000 mg | ORAL_TABLET | Freq: Every day | ORAL | 1 refills | Status: DC

## 2018-11-15 MED ORDER — LOSARTAN POTASSIUM 25 MG PO TABS: 25.0000 mg | ORAL_TABLET | Freq: Every day | ORAL | 1 refills | Status: DC

## 2018-11-15 NOTE — Telephone Encounter (Signed)
Spoke with pharmacy he stated he needed a new rx for the medications. I have sent in a new rx for the losartin, d/c the Prilosec and sent in Pepcid

## 2018-11-15 NOTE — Telephone Encounter (Signed)
Copied from CRM [phone]. Topic: General - Other >> Nov 15, 2018 10:12 AM Gerrianne ScalePayne, Kaleia Longhi L wrote: Reason for CRM: pt calling stating that the losartan (COZAAR) 25 MG tablet    metoprolol succinate (TOPROL-XL) 25 MG 24 hr tablet   omeprazole (PRILOSEC) 40 MG capsule   Need prior authorization and that the [id number] - [place], [place] - [address] [phone] (Phone) [phone] (Fax)   had faxed over a request on Monday and that pt is out of BP medicine and Omeprazole today

## 2018-11-16 ENCOUNTER — Other Ambulatory Visit: Payer: Self-pay | Admitting: Family Medicine

## 2019-01-04 ENCOUNTER — Other Ambulatory Visit: Payer: Self-pay | Admitting: Family Medicine

## 2019-01-22 ENCOUNTER — Ambulatory Visit (INDEPENDENT_AMBULATORY_CARE_PROVIDER_SITE_OTHER): Payer: Self-pay | Admitting: Family Medicine

## 2019-01-22 DIAGNOSIS — E782 Mixed hyperlipidemia: Secondary | ICD-10-CM

## 2019-01-22 DIAGNOSIS — E871 Hypo-osmolality and hyponatremia: Secondary | ICD-10-CM

## 2019-01-22 DIAGNOSIS — K219 Gastro-esophageal reflux disease without esophagitis: Secondary | ICD-10-CM

## 2019-01-22 DIAGNOSIS — R600 Localized edema: Secondary | ICD-10-CM

## 2019-01-22 DIAGNOSIS — I10 Essential (primary) hypertension: Secondary | ICD-10-CM

## 2019-01-22 MED ORDER — METOPROLOL SUCCINATE ER 25 MG PO TB24: 25.0000 mg | ORAL_TABLET | Freq: Every day | ORAL | 3 refills | Status: DC

## 2019-01-22 MED ORDER — FUROSEMIDE 20 MG PO TABS: 20.0000 mg | ORAL_TABLET | Freq: Every day | ORAL | 5 refills | Status: DC | PRN

## 2019-01-22 MED ORDER — FAMOTIDINE 40 MG PO TABS: 40.0000 mg | ORAL_TABLET | Freq: Every day | ORAL | 3 refills | Status: DC

## 2019-01-22 MED ORDER — CYCLOBENZAPRINE HCL 10 MG PO TABS: 10.0000 mg | ORAL_TABLET | Freq: Three times a day (TID) | ORAL | 3 refills | Status: DC | PRN

## 2019-01-22 MED ORDER — FAMOTIDINE 40 MG PO TABS: 40.0000 mg | ORAL_TABLET | Freq: Every day | ORAL | 1 refills | Status: DC

## 2019-01-22 MED ORDER — TRIAMCINOLONE ACETONIDE 0.025 % EX OINT: 1.0000 "application " | TOPICAL_OINTMENT | Freq: Two times a day (BID) | CUTANEOUS | 1 refills | Status: DC | PRN

## 2019-01-22 MED ORDER — LOSARTAN POTASSIUM 25 MG PO TABS: 25.0000 mg | ORAL_TABLET | Freq: Every day | ORAL | 3 refills | Status: DC

## 2019-01-22 NOTE — Assessment & Plan Note (Signed)
Refilled Famotidine today

## 2019-01-22 NOTE — Assessment & Plan Note (Signed)
Encouraged heart healthy diet, increase exercise, avoid trans fats, consider a krill oil cap daily 

## 2019-01-22 NOTE — Assessment & Plan Note (Signed)
Monitor cmp annually

## 2019-01-22 NOTE — Patient Instructions (Signed)

## 2019-01-22 NOTE — Assessment & Plan Note (Addendum)
Well controlled, no changes to meds. Encouraged heart healthy diet such as the DASH diet and exercise as tolerated. Given refills today 

## 2019-01-22 NOTE — Progress Notes (Signed)
Subjective:    Patient ID: Christina Mendez, female    DOB: 1976/07/04, 43 y.o.   MRN: [id number]  No chief complaint on file.   HPI Patient is in today for follow up on hypertension. She has been unable to find employment with medical benefits this year and cannot afford the market place so she has to pay cash for now. Fortunately she feels well and offers no acute complaints just needs refills. Denies CP/palp/SOB/HA/congestion/fevers/GI or GU c/o. Taking meds as prescribed  Past Medical History:  Diagnosis Date  . Allergic state 03/02/2012  . Allergy last couple of years   seasonal  . Cervical cancer screening 03/02/2012  . Chicken pox 3rd grade  . GERD (gastroesophageal reflux disease)   . Heartburn   . HTN (hypertension) 12/09/2011  . Hyperlipidemia   . Hypertension   . Inflamed skin tag 03/02/2012  . Menorrhagia 05/04/2015  . Obese 12/09/2011  . OSA (obstructive sleep apnea)   . Ovarian cyst 08/17/2012  . Pedal edema 07/11/2017  . Sinusitis 01/08/2012    Past Surgical History:  Procedure Laterality Date  . cyst removed  43 yr old   benign, abdominal    Family History  Problem Relation Age of Onset  . Fibromyalgia Mother   . Diabetes Mother        type 2  . Hypertension Mother   . Diabetes Father        type 2  . Heart disease Father 49       2 MI  . Hypertension Father   . Heart attack Father 23       X 2  . Stroke Father        mini strokes  . GER disease Father   . Hyperlipidemia Father   . Cholelithiasis Father   . Venous thrombosis Father        in liver  . Hypertension Brother   . Venous thrombosis Brother        on spine  . Cancer Maternal Grandfather        stomach  . Hypertension Paternal Grandmother   . Diabetes Paternal Grandmother        type 2  . Heart attack Paternal Grandmother   . Heart disease Paternal Grandmother   . Heart attack Paternal Grandfather   . Heart disease Paternal Grandfather   . Hypertension Paternal Grandfather   .  Cancer Maternal Uncle        prostate cancer    Social History   Socioeconomic History  . Marital status: Single    Spouse name: Not on file  . Number of children: Not on file  . Years of education: Not on file  . Highest education level: Not on file  Occupational History  . Occupation: Runner, broadcasting/film/video   Social Needs  . Financial resource strain: Not on file  . Food insecurity:    Worry: Not on file    Inability: Not on file  . Transportation needs:    Medical: Not on file    Non-medical: Not on file  Tobacco Use  . Smoking status: Never Smoker  . Smokeless tobacco: Never Used  Substance and Sexual Activity  . Alcohol use: Yes    Comment: OCCASIONALLY  . Drug use: No  . Sexual activity: Yes    Partners: Male    Comment: No dietary restrictions, lives with Mom, Dad and brother and niece.  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 Physical activity:  Days per week: Not on file    Minutes per session: Not on file  . Stress: Not on file  Relationships  . Social connections:    Talks on phone: Not on file    Gets together: Not on file    Attends religious service: Not on file    Active member of club or organization: Not on file    Attends meetings of clubs or organizations: Not on file    Relationship status: Not on file  . Intimate partner violence:    Fear of current or ex partner: Not on file    Emotionally abused: Not on file    Physically abused: Not on file    Forced sexual activity: Not on file  Other Topics Concern  . Not on file  Social History Narrative  . Not on file    Outpatient Medications Prior to Visit  Medication Sig Dispense Refill  . cyclobenzaprine (FLEXERIL) 10 MG tablet Take 1 tablet (10 mg total) by mouth 3 (three) times daily as needed for muscle spasms. 30 tablet 3  . famotidine (PEPCID) 40 MG tablet Take 1 tablet (40 mg total) by mouth daily. 90 tablet 1  . furosemide (LASIX) 20 MG tablet TAKE ONE TABLET BY MOUTH DAILY AS NEEDED  30 tablet 2    . losartan (COZAAR) 25 MG tablet Take 1 tablet (25 mg total) by mouth daily. 90 tablet 1  . metoprolol succinate (TOPROL-XL) 25 MG 24 hr tablet TAKE ONE TABLET BY MOUTH ONE TIME DAILY  90 tablet 0  . triamcinolone (KENALOG) 0.025 % ointment Apply 1 application topically 2 (two) times daily as needed. 30 g 1   No facility-administered medications prior to visit.     Allergies  Allergen Reactions  . Skelaxin Hypertension    Review of Systems  Constitutional: Negative for fever and malaise/fatigue.  HENT: Negative for congestion.   Eyes: Negative for blurred vision.  Respiratory: Negative for shortness of breath.   Cardiovascular: Negative for chest pain, palpitations and leg swelling.  Gastrointestinal: Negative for abdominal pain, blood in stool and nausea.  Genitourinary: Negative for dysuria and frequency.  Musculoskeletal: Negative for falls.  Skin: Negative for rash.  Neurological: Negative for dizziness, loss of consciousness and headaches.  Endo/Heme/Allergies: Negative for environmental allergies.  Psychiatric/Behavioral: Negative for depression. The patient is not nervous/anxious.        Objective:    Physical Exam Vitals signs and nursing note reviewed.  Constitutional:      General: She is not in acute distress.    Appearance: She is well-developed.  HENT:     Head: Normocephalic and atraumatic.     Nose: Nose normal.  Eyes:     General:        Right eye: No discharge.        Left eye: No discharge.  Neck:     Musculoskeletal: Normal range of motion and neck supple.  Cardiovascular:     Rate and Rhythm: Normal rate and regular rhythm.     Heart sounds: No murmur.  Pulmonary:     Effort: Pulmonary effort is normal.     Breath sounds: Normal breath sounds.  Abdominal:     General: Bowel sounds are normal.     Palpations: Abdomen is soft.     Tenderness: There is no abdominal tenderness.  Skin:    General: Skin is warm and dry.  Neurological:      Mental Status: She is alert and oriented to  person, place, and time.     BP 126/84 (BP Location: Left Arm, Patient Position: Sitting, Cuff Size: Normal)   Pulse 66   Temp 97.8 F (36.6 C) (Oral)   Resp 18   Wt (!) 382 lb 12.8 oz (173.6 kg)   SpO2 98%   BMI 58.20 kg/m  Wt Readings from Last 3 Encounters:  01/22/19 (!) 382 lb 12.8 oz (173.6 kg)  07/19/18 (!) 374 lb 9.6 oz (169.9 kg)  01/11/18 (!) 373 lb 6.4 oz (169.4 kg)     Lab Results  Component Value Date   WBC 5.6 05/04/2015   HGB 14.0 05/04/2015   HCT 41.3 05/04/2015   PLT 280.0 05/04/2015   GLUCOSE 96 07/19/2018   CHOL 160 05/04/2015   TRIG 75.0 05/04/2015   HDL 51.30 05/04/2015   LDLCALC 94 05/04/2015   ALT 16 07/19/2018   AST 14 07/19/2018   NA 136 07/19/2018   K 4.0 07/19/2018   CL 101 07/19/2018   CREATININE 0.77 07/19/2018   BUN 10 07/19/2018   CO2 30 07/19/2018   TSH 0.56 07/19/2018    Lab Results  Component Value Date   TSH 0.56 07/19/2018   Lab Results  Component Value Date   WBC 5.6 05/04/2015   HGB 14.0 05/04/2015   HCT 41.3 05/04/2015   MCV 88.1 05/04/2015   PLT 280.0 05/04/2015   Lab Results  Component Value Date   NA 136 07/19/2018   K 4.0 07/19/2018   CO2 30 07/19/2018   GLUCOSE 96 07/19/2018   BUN 10 07/19/2018   CREATININE 0.77 07/19/2018   BILITOT 0.5 07/19/2018   ALKPHOS 56 07/19/2018   AST 14 07/19/2018   ALT 16 07/19/2018   PROT 7.3 07/19/2018   ALBUMIN 4.1 07/19/2018   CALCIUM 9.5 07/19/2018   GFR 104.95 07/19/2018   Lab Results  Component Value Date   CHOL 160 05/04/2015   Lab Results  Component Value Date   HDL 51.30 05/04/2015   Lab Results  Component Value Date   LDLCALC 94 05/04/2015   Lab Results  Component Value Date   TRIG 75.0 05/04/2015   Lab Results  Component Value Date   CHOLHDL 3 05/04/2015   No results found for: HGBA1C     Assessment & Plan:   Problem List Items Addressed This Visit    HTN (hypertension)    Well controlled, no  changes to meds. Encouraged heart healthy diet such as the DASH diet and exercise as tolerated. Given refills today      Relevant Medications   losartan (COZAAR) 25 MG tablet   metoprolol succinate (TOPROL-XL) 25 MG 24 hr tablet   furosemide (LASIX) 20 MG tablet   Other Relevant Orders   Comprehensive metabolic panel   Hyperlipidemia, mixed    Encouraged heart healthy diet, increase exercise, avoid trans fats, consider a krill oil cap daily      Relevant Medications   losartan (COZAAR) 25 MG tablet   metoprolol succinate (TOPROL-XL) 25 MG 24 hr tablet   furosemide (LASIX) 20 MG tablet   Hyponatremia    Monitor cmp annually      Esophageal reflux    Refilled Famotidine today      Relevant Medications   famotidine (PEPCID) 40 MG tablet   Pedal edema   Relevant Medications   furosemide (LASIX) 20 MG tablet      I have changed Christina Mendez's metoprolol succinate and furosemide. I am also having her maintain her losartan,  cyclobenzaprine, famotidine, and triamcinolone.  Meds ordered this encounter  Medications  . DISCONTD: famotidine (PEPCID) 40 MG tablet    Sig: Take 1 tablet (40 mg total) by mouth daily.    Dispense:  90 tablet    Refill:  1  . losartan (COZAAR) 25 MG tablet    Sig: Take 1 tablet (25 mg total) by mouth daily.    Dispense:  90 tablet    Refill:  3  . metoprolol succinate (TOPROL-XL) 25 MG 24 hr tablet    Sig: Take 1 tablet (25 mg total) by mouth daily.    Dispense:  90 tablet    Refill:  3  . cyclobenzaprine (FLEXERIL) 10 MG tablet    Sig: Take 1 tablet (10 mg total) by mouth 3 (three) times daily as needed for muscle spasms.    Dispense:  30 tablet    Refill:  3  . furosemide (LASIX) 20 MG tablet    Sig: Take 1 tablet (20 mg total) by mouth daily as needed.    Dispense:  30 tablet    Refill:  5  . famotidine (PEPCID) 40 MG tablet    Sig: Take 1 tablet (40 mg total) by mouth daily.    Dispense:  90 tablet    Refill:  3  . triamcinolone  (KENALOG) 0.025 % ointment    Sig: Apply 1 application topically 2 (two) times daily as needed.    Dispense:  30 g    Refill:  1     Danise Edge, MD

## 2019-02-19 ENCOUNTER — Other Ambulatory Visit: Payer: Self-pay | Admitting: Family Medicine

## 2019-02-19 ENCOUNTER — Encounter: Payer: Self-pay | Admitting: Family Medicine

## 2019-02-19 MED ORDER — OMEPRAZOLE 40 MG PO CPDR: 40.0000 mg | DELAYED_RELEASE_CAPSULE | Freq: Every day | ORAL | 3 refills | Status: DC

## 2019-02-19 MED ORDER — FAMOTIDINE 40 MG PO TABS: 40.0000 mg | ORAL_TABLET | Freq: Every day | ORAL | 3 refills | Status: DC

## 2019-02-20 MED ORDER — OMEPRAZOLE 40 MG PO CPDR: 40.0000 mg | DELAYED_RELEASE_CAPSULE | Freq: Every day | ORAL | 3 refills | Status: DC

## 2019-04-08 ENCOUNTER — Other Ambulatory Visit: Payer: Self-pay | Admitting: Family Medicine

## 2019-05-14 ENCOUNTER — Other Ambulatory Visit: Payer: Self-pay | Admitting: Family Medicine

## 2019-06-25 ENCOUNTER — Other Ambulatory Visit: Payer: Self-pay | Admitting: Family Medicine

## 2019-07-03 ENCOUNTER — Other Ambulatory Visit (INDEPENDENT_AMBULATORY_CARE_PROVIDER_SITE_OTHER): Payer: Self-pay

## 2019-07-03 DIAGNOSIS — I10 Essential (primary) hypertension: Secondary | ICD-10-CM

## 2019-07-03 LAB — COMPREHENSIVE METABOLIC PANEL
ALT: 17 U/L (ref 0–35)
AST: 15 U/L (ref 0–37)
Albumin: 4.2 g/dL (ref 3.5–5.2)
Alkaline Phosphatase: 61 U/L (ref 39–117)
BUN: 11 mg/dL (ref 6–23)
CO2: 28 mEq/L (ref 19–32)
Calcium: 9.2 mg/dL (ref 8.4–10.5)
Chloride: 100 mEq/L (ref 96–112)
Creatinine, Ser: 0.8 mg/dL (ref 0.40–1.20)
GFR: 94.07 mL/min (ref >60.00)
Glucose, Bld: 92 mg/dL (ref 70–99)
Potassium: 3.7 mEq/L (ref 3.5–5.1)
Sodium: 136 mEq/L (ref 135–145)
Total Bilirubin: 0.5 mg/dL (ref 0.2–1.2)
Total Protein: 7.3 g/dL (ref 6.0–8.3)

## 2019-07-08 ENCOUNTER — Other Ambulatory Visit: Payer: Self-pay | Admitting: Family Medicine

## 2019-07-22 ENCOUNTER — Encounter: Payer: Self-pay | Admitting: Family Medicine

## 2019-07-22 ENCOUNTER — Other Ambulatory Visit: Payer: Self-pay

## 2019-07-22 ENCOUNTER — Ambulatory Visit: Payer: Self-pay | Admitting: Family Medicine

## 2019-07-22 VITALS — BP 124/76 | HR 69 | Temp 98.0°F | Resp 18 | Ht 67.0 in | Wt 395.2 lb

## 2019-07-22 DIAGNOSIS — E782 Mixed hyperlipidemia: Secondary | ICD-10-CM

## 2019-07-22 DIAGNOSIS — I10 Essential (primary) hypertension: Secondary | ICD-10-CM

## 2019-07-22 DIAGNOSIS — Z0000 Encounter for general adult medical examination without abnormal findings: Secondary | ICD-10-CM

## 2019-07-22 DIAGNOSIS — E669 Obesity, unspecified: Secondary | ICD-10-CM

## 2019-07-22 MED ORDER — OMEPRAZOLE 40 MG PO CPDR: 40.0000 mg | DELAYED_RELEASE_CAPSULE | Freq: Every day | ORAL | 1 refills | Status: DC

## 2019-07-22 MED ORDER — CYCLOBENZAPRINE HCL 10 MG PO TABS: 10.0000 mg | ORAL_TABLET | Freq: Two times a day (BID) | ORAL | 2 refills | Status: DC | PRN

## 2019-07-22 NOTE — Assessment & Plan Note (Signed)
Encouraged heart healthy diet, increase exercise, avoid trans fats, consider a krill oil cap daily 

## 2019-07-22 NOTE — Assessment & Plan Note (Signed)
Encouraged DASH diet, decrease po intake and increase exercise as tolerated. Needs 7-8 hours of sleep nightly. Avoid trans fats, eat small, frequent meals every 4-5 hours with lean proteins, complex carbs and healthy fats. Minimize simple carbs, Weight [employer] APP

## 2019-07-22 NOTE — Patient Instructions (Addendum)
Pulse Oximeter  Vitals weekly  [employer] APP  Tylenol/Acetaminaphen 500 mg 1-2 tabs up to 3 x daily (max is 3000 mg in 24 hours)  Advil/Motrin/Ibuprofen 200 mg tabs, 1-2 tabs up to 3 x a day (max is 2400 mg in 24 hours) Or  Aleve/Naproxen 220 tabs, 1-2 tabs up to 2 x a day Preventive Care 67-44 Years Old, Female Preventive care refers to visits with your health care provider and lifestyle choices that can promote health and wellness. This includes:  A yearly physical exam. This may also be called an annual well check.  Regular dental visits and eye exams.  Immunizations.  Screening for certain conditions.  Healthy lifestyle choices, such as eating a healthy diet, getting regular exercise, not using drugs or products that contain nicotine and tobacco, and limiting alcohol use. What can I expect for my preventive care visit? Physical exam Your health care provider will check your:  Height and weight. This may be used to calculate body mass index (BMI), which tells if you are at a healthy weight.  Heart rate and blood pressure.  Skin for abnormal spots. Counseling Your health care provider may ask you questions about your:  Alcohol, tobacco, and drug use.  Emotional well-being.  Home and relationship well-being.  Sexual activity.  Eating habits.  Work and work Statistician.  Method of birth control.  Menstrual cycle.  Pregnancy history. What immunizations do I need?  Influenza (flu) vaccine  This is recommended every year. Tetanus, diphtheria, and pertussis (Tdap) vaccine  You may need a Td booster every 10 years. Varicella (chickenpox) vaccine  You may need this if you have not been vaccinated. Human papillomavirus (HPV) vaccine  If recommended by your health care provider, you may need three doses over 6 months. Measles, mumps, and rubella (MMR) vaccine  You may need at least one dose of MMR. You may also need a second dose. Meningococcal  conjugate (MenACWY) vaccine  One dose is recommended if you are age 44-21 years and a first-year college student living in a residence hall, or if you have one of several medical conditions. You may also need additional booster doses. Pneumococcal conjugate (PCV13) vaccine  You may need this if you have certain conditions and were not previously vaccinated. Pneumococcal polysaccharide (PPSV23) vaccine  You may need one or two doses if you smoke cigarettes or if you have certain conditions. Hepatitis A vaccine  You may need this if you have certain conditions or if you travel or work in places where you may be exposed to hepatitis A. Hepatitis B vaccine  You may need this if you have certain conditions or if you travel or work in places where you may be exposed to hepatitis B. Haemophilus influenzae type b (Hib) vaccine  You may need this if you have certain conditions. You may receive vaccines as individual doses or as more than one vaccine together in one shot (combination vaccines). Talk with your health care provider about the risks and benefits of combination vaccines. What tests do I need?  Blood tests  Lipid and cholesterol levels. These may be checked every 5 years starting at age 44.  Hepatitis C test.  Hepatitis B test. Screening  Diabetes screening. This is done by checking your blood sugar (glucose) after you have not eaten for a while (fasting).  Sexually transmitted disease (STD) testing.  BRCA-related cancer screening. This may be done if you have a family history of breast, ovarian, tubal, or peritoneal cancers.  Pelvic exam and Pap test. This may be done every 3 years starting at age 44. Starting at age 5, this may be done every 5 years if you have a Pap test in combination with an HPV test. Talk with your health care provider about your test results, treatment options, and if necessary, the need for more tests. Follow these instructions at home: Eating and  drinking   Eat a diet that includes fresh fruits and vegetables, whole grains, lean protein, and low-fat dairy.  Take vitamin and mineral supplements as recommended by your health care provider.  Do not drink alcohol if: ? Your health care provider tells you not to drink. ? You are pregnant, may be pregnant, or are planning to become pregnant.  If you drink alcohol: ? Limit how much you have to 0-1 drink a day. ? Be aware of how much alcohol is in your drink. In the [place] one drink equals one 12 oz bottle of beer (355 mL), one 5 oz glass of wine (148 mL), or one 1 oz glass of hard liquor (44 mL). Lifestyle  Take daily care of your teeth and gums.  Stay active. Exercise for at least 30 minutes on 5 or more days each week.  Do not use any products that contain nicotine or tobacco, such as cigarettes, e-cigarettes, and chewing tobacco. If you need help quitting, ask your health care provider.  If you are sexually active, practice safe sex. Use a condom or other form of birth control (contraception) in order to prevent pregnancy and STIs (sexually transmitted infections). If you plan to become pregnant, see your health care provider for a preconception visit. What's next?  Visit your health care provider once a year for a well check visit.  Ask your health care provider how often you should have your eyes and teeth checked.  Stay up to date on all vaccines. This information is not intended to replace advice given to you by your health care provider. Make sure you discuss any questions you have with your health care provider. Document Released: 01/24/2002 Document Revised: 08/09/2018 Document Reviewed: 08/09/2018 Elsevier Patient Education  2020 [employer].

## 2019-07-22 NOTE — Assessment & Plan Note (Signed)
Patient encouraged to . maintain heart healthy diet, regular exercise, adequate sleep. Consider daily probiotics. Take medications as prescribed. Labs reviewed and ordered. Labs ordered and reviewed

## 2019-07-22 NOTE — Assessment & Plan Note (Signed)
Well controlled, no changes to meds. Encouraged heart healthy diet such as the DASH diet and exercise as tolerated.  °

## 2019-07-24 NOTE — Progress Notes (Signed)
Subjective:    Patient ID: Christina Mendez, female    DOB: [birth year], 43 y.o.   MRN: [id number]  No chief complaint on file.   HPI Patient is in today for annual prevventative exam and follow up on chronic medical concerns including hypertension, hyperlipidemia, hyperlipidemia and more. She is feeling well tod y. No recent febrile illness or hospitalizations. Is maintaining quarantine most days. No acute concerns. Denies CP/palp/SOB/HA/congestion/fevers/GI or GU c/o. Taking meds as prescribed  Past Medical History:  Diagnosis Date  . Allergic state 03/02/2012  . Allergy last couple of years   seasonal  . Cervical cancer screening 03/02/2012  . Chicken pox 3rd grade  . GERD (gastroesophageal reflux disease)   . Heartburn   . HTN (hypertension) 12/09/2011  . Hyperlipidemia   . Hypertension   . Inflamed skin tag 03/02/2012  . Menorrhagia 05/04/2015  . Obese 12/09/2011  . OSA (obstructive sleep apnea)   . Ovarian cyst 08/17/2012  . Pedal edema 07/11/2017  . Sinusitis 01/08/2012    Past Surgical History:  Procedure Laterality Date  . cyst removed  43 yr old   benign, abdominal    Family History  Problem Relation Age of Onset  . Fibromyalgia Mother   . Diabetes Mother        type 2  . Hypertension Mother   . Diabetes Father        type 2  . Heart disease Father [id number]       2 MI  . Hypertension Father   . Heart attack Father 23       X 2  . Stroke Father        mini strokes  . GER disease Father   . Hyperlipidemia Father   . Cholelithiasis Father   . Venous thrombosis Father        in liver  . Hypertension Brother   . Venous thrombosis Brother        on spine  . Cancer Maternal Grandfather        stomach  . Hypertension Paternal Grandmother   . Diabetes Paternal Grandmother        type 2  . Heart attack Paternal Grandmother   . Heart disease Paternal Grandmother   . Heart attack Paternal Grandfather   . Heart disease Paternal Grandfather   . Hypertension  Paternal Grandfather   . Cancer Maternal Uncle        prostate cancer    Social History   Socioeconomic History  . Marital status: Single    Spouse name: Not on file  . Number of children: Not on file  . Years of education: Not on file  . Highest education level: Not on file  Occupational History  . Occupation: Runner, broadcasting/film/videoTeacher   Social Needs  . Financial resource strain: Not on file  . Food insecurity    Worry: Not on file    Inability: Not on file  . Transportation needs    Medical: Not on file    Non-medical: Not on file  Tobacco Use  . Smoking status: Never Smoker  . Smokeless tobacco: Never Used  Substance and Sexual Activity  . Alcohol use: Yes    Comment: OCCASIONALLY  . Drug use: No  . Sexual activity: Yes    Partners: Male    Comment: No dietary restrictions, lives with Mom, Dad and brother and niece.  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 Physical activity    Days per week: Not on  file    Minutes per session: Not on file  . Stress: Not on file  Relationships  . Social Herbalist on phone: Not on file    Gets together: Not on file    Attends religious service: Not on file    Active member of club or organization: Not on file    Attends meetings of clubs or organizations: Not on file    Relationship status: Not on file  . Intimate partner violence    Fear of current or ex partner: Not on file    Emotionally abused: Not on file    Physically abused: Not on file    Forced sexual activity: Not on file  Other Topics Concern  . Not on file  Social History Narrative  . Not on file    Outpatient Medications Prior to Visit  Medication Sig Dispense Refill  . cyclobenzaprine (FLEXERIL) 10 MG tablet Take 1 tablet (10 mg total) by mouth 3 (three) times daily as needed for muscle spasms. 30 tablet 3  . famotidine (PEPCID) 40 MG tablet Take 1 tablet (40 mg total) by mouth daily. 90 tablet 3  . furosemide (LASIX) 20 MG tablet Take 1 tablet (20 mg total) by  mouth daily as needed. 30 tablet 5  . losartan (COZAAR) 25 MG tablet Take 1 tablet (25 mg total) by mouth daily. 90 tablet 3  . metoprolol succinate (TOPROL-XL) 25 MG 24 hr tablet TAKE ONE TABLET BY MOUTH ONE TIME DAILY  90 tablet 0  . triamcinolone (KENALOG) 0.025 % ointment Apply 1 application topically 2 (two) times daily as needed. 30 g 1  . omeprazole (PRILOSEC) 40 MG capsule TAKE ONE CAPSULE BY MOUTH ONE TIME DAILY  30 capsule 0   No facility-administered medications prior to visit.     Allergies  Allergen Reactions  . Skelaxin Hypertension    Review of Systems  Constitutional: Negative for chills, fever and malaise/fatigue.  HENT: Negative for congestion and hearing loss.   Eyes: Negative for blurred vision and discharge.  Respiratory: Negative for cough, sputum production and shortness of breath.   Cardiovascular: Negative for chest pain, palpitations and leg swelling.  Gastrointestinal: Negative for abdominal pain, blood in stool, constipation, diarrhea, heartburn, nausea and vomiting.  Genitourinary: Negative for dysuria, frequency, hematuria and urgency.  Musculoskeletal: Negative for back pain, falls and myalgias.  Skin: Negative for rash.  Neurological: Negative for dizziness, sensory change, loss of consciousness, weakness and headaches.  Endo/Heme/Allergies: Negative for environmental allergies. Does not bruise/bleed easily.  Psychiatric/Behavioral: Negative for depression and suicidal ideas. The patient is not nervous/anxious and does not have insomnia.        Objective:    Physical Exam  BP 124/76 (BP Location: Left Arm, Patient Position: Sitting, Cuff Size: Normal)   Pulse 69   Temp 98 F (36.7 C) (Oral)   Resp 18   Ht 5\' 7"  (1.702 m)   Wt (!) 395 lb 3.2 oz (179.3 kg)   SpO2 97%   BMI 61.90 kg/m  Wt Readings from Last 3 Encounters:  07/22/19 (!) 395 lb 3.2 oz (179.3 kg)  01/22/19 (!) 382 lb 12.8 oz (173.6 kg)  07/19/18 (!) 374 lb 9.6 oz (169.9 kg)     Diabetic Foot Exam - Simple   No data filed     Lab Results  Component Value Date   WBC 5.6 05/04/2015   HGB 14.0 05/04/2015   HCT 41.3 05/04/2015   PLT 280.0 05/04/2015  GLUCOSE 92 07/03/2019   CHOL 160 05/04/2015   TRIG 75.0 05/04/2015   HDL 51.30 05/04/2015   LDLCALC 94 05/04/2015   ALT 17 07/03/2019   AST 15 07/03/2019   NA 136 07/03/2019   K 3.7 07/03/2019   CL 100 07/03/2019   CREATININE 0.80 07/03/2019   BUN 11 07/03/2019   CO2 28 07/03/2019   TSH 0.56 07/19/2018    Lab Results  Component Value Date   TSH 0.56 07/19/2018   Lab Results  Component Value Date   WBC 5.6 05/04/2015   HGB 14.0 05/04/2015   HCT 41.3 05/04/2015   MCV 88.1 05/04/2015   PLT 280.0 05/04/2015   Lab Results  Component Value Date   NA 136 07/03/2019   K 3.7 07/03/2019   CO2 28 07/03/2019   GLUCOSE 92 07/03/2019   BUN 11 07/03/2019   CREATININE 0.80 07/03/2019   BILITOT 0.5 07/03/2019   ALKPHOS 61 07/03/2019   AST 15 07/03/2019   ALT 17 07/03/2019   PROT 7.3 07/03/2019   ALBUMIN 4.2 07/03/2019   CALCIUM 9.2 07/03/2019   GFR 94.07 07/03/2019   Lab Results  Component Value Date   CHOL 160 05/04/2015   Lab Results  Component Value Date   HDL 51.30 05/04/2015   Lab Results  Component Value Date   LDLCALC 94 05/04/2015   Lab Results  Component Value Date   TRIG 75.0 05/04/2015   Lab Results  Component Value Date   CHOLHDL 3 05/04/2015   No results found for: HGBA1C     Assessment & Plan:   Problem List Items Addressed This Visit    HTN (hypertension)    Well controlled, no changes to meds. Encouraged heart healthy diet such as the DASH diet and exercise as tolerated.       Relevant Orders   CBC   Comprehensive metabolic panel   TSH   Obese    Encouraged DASH diet, decrease po intake and increase exercise as tolerated. Needs 7-8 hours of sleep nightly. Avoid trans fats, eat small, frequent meals every 4-5 hours with lean proteins, complex carbs and healthy  fats. Minimize simple carbs, [employer] APP       Relevant Orders   Hemoglobin A1c   Preventative health care - Primary    Patient encouraged to . maintain heart healthy diet, regular exercise, adequate sleep. Consider daily probiotics. Take medications as prescribed. Labs reviewed and ordered. Labs ordered and reviewed      Relevant Orders   Hemoglobin A1c   CBC   Comprehensive metabolic panel   Lipid panel   TSH   Hyperlipidemia, mixed    Encouraged heart healthy diet, increase exercise, avoid trans fats, consider a krill oil cap daily      Relevant Orders   Lipid panel      I have changed Lyrica L. Hane's omeprazole. I am also having her start on cyclobenzaprine. Additionally, I am having her maintain her losartan, cyclobenzaprine, furosemide, triamcinolone, famotidine, and metoprolol succinate.  Meds ordered this encounter  Medications  . omeprazole (PRILOSEC) 40 MG capsule    Sig: Take 1 capsule (40 mg total) by mouth daily.    Dispense:  90 capsule    Refill:  1  . cyclobenzaprine (FLEXERIL) 10 MG tablet    Sig: Take 1 tablet (10 mg total) by mouth 2 (two) times daily as needed for muscle spasms.    Dispense:  30 tablet    Refill:  2  Penni Homans, MD

## 2019-08-20 ENCOUNTER — Encounter: Payer: Self-pay | Admitting: Family Medicine

## 2019-10-07 ENCOUNTER — Other Ambulatory Visit: Payer: Self-pay | Admitting: Family Medicine

## 2019-11-21 ENCOUNTER — Other Ambulatory Visit: Payer: Self-pay | Admitting: Family Medicine

## 2019-11-21 DIAGNOSIS — R600 Localized edema: Secondary | ICD-10-CM

## 2019-12-02 ENCOUNTER — Other Ambulatory Visit: Payer: Self-pay | Admitting: Family Medicine

## 2019-12-16 ENCOUNTER — Other Ambulatory Visit: Payer: Self-pay | Admitting: Family Medicine

## 2019-12-16 DIAGNOSIS — R600 Localized edema: Secondary | ICD-10-CM

## 2020-01-20 ENCOUNTER — Other Ambulatory Visit: Payer: Self-pay | Admitting: Family Medicine

## 2020-01-20 DIAGNOSIS — R600 Localized edema: Secondary | ICD-10-CM

## 2020-01-27 ENCOUNTER — Encounter: Payer: Self-pay | Admitting: Internal Medicine

## 2020-01-27 ENCOUNTER — Ambulatory Visit (INDEPENDENT_AMBULATORY_CARE_PROVIDER_SITE_OTHER): Payer: 59 | Admitting: Family Medicine

## 2020-01-27 ENCOUNTER — Other Ambulatory Visit: Payer: Self-pay

## 2020-01-27 VITALS — BP 127/67 | HR 60 | Temp 96.6°F | Resp 16 | Ht 68.0 in | Wt 392.0 lb

## 2020-01-27 DIAGNOSIS — R32 Unspecified urinary incontinence: Secondary | ICD-10-CM | POA: Diagnosis not present

## 2020-01-27 DIAGNOSIS — Z0000 Encounter for general adult medical examination without abnormal findings: Secondary | ICD-10-CM

## 2020-01-27 DIAGNOSIS — I10 Essential (primary) hypertension: Secondary | ICD-10-CM | POA: Diagnosis not present

## 2020-01-27 DIAGNOSIS — Z1239 Encounter for other screening for malignant neoplasm of breast: Secondary | ICD-10-CM

## 2020-01-27 DIAGNOSIS — E669 Obesity, unspecified: Secondary | ICD-10-CM

## 2020-01-27 DIAGNOSIS — E782 Mixed hyperlipidemia: Secondary | ICD-10-CM | POA: Diagnosis not present

## 2020-01-27 DIAGNOSIS — Z1211 Encounter for screening for malignant neoplasm of colon: Secondary | ICD-10-CM | POA: Diagnosis not present

## 2020-01-27 LAB — CBC
HCT: 41.6 % (ref 36.0–46.0)
Hemoglobin: 13.9 g/dL (ref 12.0–15.0)
MCHC: 33.5 g/dL (ref 30.0–36.0)
MCV: 89.2 fl (ref 78.0–100.0)
Platelets: 268 10*3/uL (ref 150.0–400.0)
RBC: 4.66 Mil/uL (ref 3.87–5.11)
RDW: 13.7 % (ref 11.5–15.5)
WBC: 5.2 10*3/uL (ref 4.0–10.5)

## 2020-01-27 LAB — LIPID PANEL
Cholesterol: 189 mg/dL (ref 0–200)
HDL: 52 mg/dL (ref >39.00)
LDL Cholesterol: 107 mg/dL — ABNORMAL HIGH (ref 0–99)
NonHDL: 137.3
Total CHOL/HDL Ratio: 4
Triglycerides: 151 mg/dL — ABNORMAL HIGH (ref 0.0–149.0)
VLDL: 30.2 mg/dL (ref 0.0–40.0)

## 2020-01-27 LAB — COMPREHENSIVE METABOLIC PANEL
ALT: 20 U/L (ref 0–35)
AST: 16 U/L (ref 0–37)
Albumin: 4 g/dL (ref 3.5–5.2)
Alkaline Phosphatase: 92 U/L (ref 39–117)
BUN: 10 mg/dL (ref 6–23)
CO2: 29 mEq/L (ref 19–32)
Calcium: 8.9 mg/dL (ref 8.4–10.5)
Chloride: 104 mEq/L (ref 96–112)
Creatinine, Ser: 0.78 mg/dL (ref 0.40–1.20)
GFR: 96.61 mL/min (ref >60.00)
Glucose, Bld: 90 mg/dL (ref 70–99)
Potassium: 3.9 mEq/L (ref 3.5–5.1)
Sodium: 139 mEq/L (ref 135–145)
Total Bilirubin: 0.4 mg/dL (ref 0.2–1.2)
Total Protein: 6.9 g/dL (ref 6.0–8.3)

## 2020-01-27 LAB — URINALYSIS
Bilirubin Urine: NEGATIVE
Hgb urine dipstick: NEGATIVE
Ketones, ur: NEGATIVE
Leukocytes,Ua: NEGATIVE
Nitrite: NEGATIVE
Specific Gravity, Urine: 1.02 (ref 1.000–1.030)
Total Protein, Urine: NEGATIVE
Urine Glucose: NEGATIVE
Urobilinogen, UA: 0.2 (ref 0.0–1.0)
pH: 5.5 (ref 5.0–8.0)

## 2020-01-27 LAB — TSH: TSH: 0.74 u[IU]/mL (ref 0.35–4.50)

## 2020-01-27 MED ORDER — OXYBUTYNIN CHLORIDE ER 5 MG PO TB24: 5.0000 mg | ORAL_TABLET | Freq: Every day | ORAL | 3 refills | Status: DC

## 2020-01-27 NOTE — Assessment & Plan Note (Signed)
Significant incontinence, can be a gush and other times a dribble. Will refer to urology and start on Ditropan

## 2020-01-27 NOTE — Assessment & Plan Note (Signed)
Encouraged heart healthy diet, increase exercise, avoid trans fats, consider a krill oil cap daily 

## 2020-01-27 NOTE — Progress Notes (Signed)
Subjective:    Patient ID: Christina Mendez, female    DOB: Jun 18, 1976, 44 y.o.   MRN: [id number]  Chief Complaint  Patient presents with  . Annual Exam    doing well    HPI Patient is in today for annual preventative exam. She has been able to get health insurance this year so she is interested in getting caught up with her preventative care. She is ready to proceed with mammogram and colonoscopy and she has had a pap at [facility]. She denies any recent febrile illness or hospitalizations. She is currently working 7 days a week so she notes fatigue. She is trying to eat better but struggles  Past Medical History:  Diagnosis Date  . Allergic state 03/02/2012  . Allergy last couple of years   seasonal  . Cervical cancer screening 03/02/2012  . Chicken pox 3rd grade  . GERD (gastroesophageal reflux disease)   . Heartburn   . HTN (hypertension) 12/09/2011  . Hyperlipidemia   . Hypertension   . Inflamed skin tag 03/02/2012  . Menorrhagia 05/04/2015  . Obese 12/09/2011  . OSA (obstructive sleep apnea)   . Ovarian cyst 08/17/2012  . Pedal edema 07/11/2017  . Sinusitis 01/08/2012    Past Surgical History:  Procedure Laterality Date  . cyst removed  44 yr old   benign, abdominal    Family History  Problem Relation Age of Onset  . Fibromyalgia Mother   . Diabetes Mother        type 2  . Hypertension Mother   . Diabetes Father        type 2  . Heart disease Father 75       2 MI  . Hypertension Father   . Heart attack Father 23       X 2  . Stroke Father        mini strokes  . GER disease Father   . Hyperlipidemia Father   . Cholelithiasis Father   . Venous thrombosis Father        in liver  . Hypertension Brother   . Venous thrombosis Brother        on spine  . Cancer Maternal Grandfather        stomach  . Hypertension Paternal Grandmother   . Diabetes Paternal Grandmother        type 2  . Heart attack Paternal Grandmother   . Heart disease Paternal Grandmother   .  Heart attack Paternal Grandfather   . Heart disease Paternal Grandfather   . Hypertension Paternal Grandfather   . Cancer Maternal Uncle        prostate cancer    Social History   Socioeconomic History  . Marital status: Single    Spouse name: Not on file  . Number of children: Not on file  . Years of education: Not on file  . Highest education level: Not on file  Occupational History  . Occupation: Runner, broadcasting/film/video   Tobacco Use  . Smoking status: Never Smoker  . Smokeless tobacco: Never Used  Substance and Sexual Activity  . Alcohol use: Yes    Comment: OCCASIONALLY  . Drug use: No  . Sexual activity: Yes    Partners: Male    Comment: No dietary restrictions, lives with Mom, Dad and brother and niece.  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 Not on file  Social History Narrative  . Not on file   Social Determinants of Health  Financial Resource Strain:   . Difficulty of Paying Living Expenses: Not on file  Food Insecurity:   . Worried About Programme researcher, broadcasting/film/video in the Last Year: Not on file  . Ran Out of Food in the Last Year: Not on file  Transportation Needs:   . Lack of Transportation (Medical): Not on file  . Lack of Transportation (Non-Medical): Not on file  Physical Activity:   . Days of Exercise per Week: Not on file  . Minutes of Exercise per Session: Not on file  Stress:   . Feeling of Stress : Not on file  Social Connections:   . Frequency of Communication with Friends and Family: Not on file  . Frequency of Social Gatherings with Friends and Family: Not on file  . Attends Religious Services: Not on file  . Active Member of Clubs or Organizations: Not on file  . Attends Banker Meetings: Not on file  . Marital Status: Not on file  Intimate Partner Violence:   . Fear of Current or Ex-Partner: Not on file  . Emotionally Abused: Not on file  . Physically Abused: Not on file  . Sexually Abused: Not on file    Outpatient Medications  Prior to Visit  Medication Sig Dispense Refill  . cyclobenzaprine (FLEXERIL) 10 MG tablet Take 1 tablet (10 mg total) by mouth 3 (three) times daily as needed for muscle spasms. 30 tablet 3  . cyclobenzaprine (FLEXERIL) 10 MG tablet TAKE ONE TABLET BY MOUTH TWICE DAILY AS NEEDED FOR MUSCLES SPASMS 30 tablet 0  . furosemide (LASIX) 20 MG tablet TAKE ONE TABLET BY MOUTH DAILY AS NEEDED  30 tablet 0  . losartan (COZAAR) 25 MG tablet Take 1 tablet (25 mg total) by mouth daily. 90 tablet 3  . metoprolol succinate (TOPROL-XL) 25 MG 24 hr tablet TAKE ONE TABLET BY MOUTH ONE TIME DAILY  90 tablet 0  . omeprazole (PRILOSEC) 40 MG capsule TAKE ONE CAPSULE BY MOUTH ONE TIME DAILY  90 capsule 0  . triamcinolone (KENALOG) 0.025 % ointment Apply 1 application topically 2 (two) times daily as needed. 30 g 1  . famotidine (PEPCID) 40 MG tablet Take 1 tablet (40 mg total) by mouth daily. 90 tablet 3   No facility-administered medications prior to visit.    Allergies  Allergen Reactions  . Skelaxin Hypertension    Review of Systems  Constitutional: Positive for malaise/fatigue. Negative for chills and fever.  HENT: Negative for congestion and hearing loss.   Eyes: Negative for discharge.  Respiratory: Negative for cough, sputum production and shortness of breath.   Cardiovascular: Negative for chest pain, palpitations and leg swelling.  Gastrointestinal: Negative for abdominal pain, blood in stool, constipation, diarrhea, heartburn, nausea and vomiting.  Genitourinary: Positive for frequency and urgency. Negative for dysuria and hematuria.  Musculoskeletal: Positive for myalgias. Negative for back pain and falls.  Skin: Negative for rash.  Neurological: Negative for dizziness, sensory change, loss of consciousness, weakness and headaches.  Endo/Heme/Allergies: Negative for environmental allergies. Does not bruise/bleed easily.  Psychiatric/Behavioral: Negative for depression and suicidal ideas. The  patient is not nervous/anxious and does not have insomnia.        Objective:    Physical Exam Constitutional:      General: She is not in acute distress.    Appearance: She is well-developed. She is obese.  HENT:     Head: Normocephalic and atraumatic.  Eyes:     Conjunctiva/sclera: Conjunctivae normal.  Neck:  Thyroid: No thyromegaly.  Cardiovascular:     Rate and Rhythm: Normal rate and regular rhythm.     Heart sounds: Normal heart sounds. No murmur.  Pulmonary:     Effort: Pulmonary effort is normal. No respiratory distress.     Breath sounds: Normal breath sounds.  Abdominal:     General: Bowel sounds are normal. There is no distension.     Palpations: Abdomen is soft. There is no mass.     Tenderness: There is no abdominal tenderness.  Musculoskeletal:     Cervical back: Neck supple.  Lymphadenopathy:     Cervical: No cervical adenopathy.  Skin:    General: Skin is warm and dry.  Neurological:     Mental Status: She is alert and oriented to person, place, and time.  Psychiatric:        Behavior: Behavior normal.     BP 127/67 (BP Location: Right Arm, Patient Position: Sitting, Cuff Size: Large)   Pulse 60   Temp (!) 96.6 F (35.9 C) (Temporal)   Resp 16   Ht 5\' 8"  (1.727 m)   Wt (!) 392 lb (177.8 kg)   SpO2 100%   BMI 59.60 kg/m  Wt Readings from Last 3 Encounters:  01/27/20 (!) 392 lb (177.8 kg)  07/22/19 (!) 395 lb 3.2 oz (179.3 kg)  01/22/19 (!) 382 lb 12.8 oz (173.6 kg)    Diabetic Foot Exam - Simple   No data filed     Lab Results  Component Value Date   WBC 5.6 05/04/2015   HGB 14.0 05/04/2015   HCT 41.3 05/04/2015   PLT 280.0 05/04/2015   GLUCOSE 92 07/03/2019   CHOL 160 05/04/2015   TRIG 75.0 05/04/2015   HDL 51.30 05/04/2015   LDLCALC 94 05/04/2015   ALT 17 07/03/2019   AST 15 07/03/2019   NA 136 07/03/2019   K 3.7 07/03/2019   CL 100 07/03/2019   CREATININE 0.80 07/03/2019   BUN 11 07/03/2019   CO2 28 07/03/2019   TSH  0.56 07/19/2018    Lab Results  Component Value Date   TSH 0.56 07/19/2018   Lab Results  Component Value Date   WBC 5.6 05/04/2015   HGB 14.0 05/04/2015   HCT 41.3 05/04/2015   MCV 88.1 05/04/2015   PLT 280.0 05/04/2015   Lab Results  Component Value Date   NA 136 07/03/2019   K 3.7 07/03/2019   CO2 28 07/03/2019   GLUCOSE 92 07/03/2019   BUN 11 07/03/2019   CREATININE 0.80 07/03/2019   BILITOT 0.5 07/03/2019   ALKPHOS 61 07/03/2019   AST 15 07/03/2019   ALT 17 07/03/2019   PROT 7.3 07/03/2019   ALBUMIN 4.2 07/03/2019   CALCIUM 9.2 07/03/2019   GFR 94.07 07/03/2019   Lab Results  Component Value Date   CHOL 160 05/04/2015   Lab Results  Component Value Date   HDL 51.30 05/04/2015   Lab Results  Component Value Date   LDLCALC 94 05/04/2015   Lab Results  Component Value Date   TRIG 75.0 05/04/2015   Lab Results  Component Value Date   CHOLHDL 3 05/04/2015   No results found for: HGBA1C     Assessment & Plan:   Problem List Items Addressed This Visit    HTN (hypertension)    Well controlled, no changes to meds. Encouraged heart healthy diet such as the DASH diet and exercise as tolerated.       Relevant Orders  Comprehensive metabolic panel   TSH   Obese    Encouraged DASH diet, decrease po intake and increase exercise as tolerated. Needs 7-8 hours of sleep nightly. Avoid trans fats, eat small, frequent meals every 4-5 hours with lean proteins, complex carbs and healthy fats. Minimize simple carbs, she tried the [employer] with her cousin's chuAPPrch but since she could not do the whole thing she gave up simple carbs for 21 days. Consider [place] APP or NOOM. Without carbs she lost weight and she felt better.       Preventative health care    Patient encouraged to maintain heart healthy diet, regular exercise, adequate sleep. Consider daily probiotics. Take medications as prescribed. MGM ordered and referred for screening colonoscopy if her insurance  will cover. She reports she had a Pap at [facility] on [address] in 2019 that was normal. Will have her sign for [place] to get a copy today      Relevant Orders   CBC   Comprehensive metabolic panel   TSH   Hyperlipidemia, mixed    Encouraged heart healthy diet, increase exercise, avoid trans fats, consider a krill oil cap daily      Relevant Orders   Lipid panel   Absence of bladder continence    Significant incontinence, can be a gush and other times a dribble. Will refer to urology and start on Ditropan       Relevant Medications   oxybutynin (DITROPAN XL) 5 MG 24 hr tablet   Other Relevant Orders   Urinalysis   Urine Culture    Other Visit Diagnoses    Encounter for screening for malignant neoplasm of breast, unspecified screening modality    -  Primary   Relevant Orders   MM 3D SCREEN BREAST BILATERAL   Colon cancer screening       Relevant Orders   Ambulatory referral to Gastroenterology      I have discontinued Kristl L. Shehata's famotidine. I am also having her start on oxybutynin. Additionally, I am having her maintain her losartan, cyclobenzaprine, triamcinolone, metoprolol succinate, cyclobenzaprine, omeprazole, and furosemide.  Meds ordered this encounter  Medications  . oxybutynin (DITROPAN XL) 5 MG 24 hr tablet    Sig: Take 1 tablet (5 mg total) by mouth at bedtime.    Dispense:  30 tablet    Refill:  3     Penni Homans, MD

## 2020-01-27 NOTE — Assessment & Plan Note (Addendum)
Patient encouraged to maintain heart healthy diet, regular exercise, adequate sleep. Consider daily probiotics. Take medications as prescribed. MGM ordered and referred for screening colonoscopy if her insurance will cover. She reports she had a Pap at [facility] on [address] in 2019 that was normal. Will have her sign for [place] to get a copy today

## 2020-01-27 NOTE — Assessment & Plan Note (Signed)
Well controlled, no changes to meds. Encouraged heart healthy diet such as the DASH diet and exercise as tolerated.  °

## 2020-01-27 NOTE — Assessment & Plan Note (Signed)
Encouraged DASH diet, decrease po intake and increase exercise as tolerated. Needs 7-8 hours of sleep nightly. Avoid trans fats, eat small, frequent meals every 4-5 hours with lean proteins, complex carbs and healthy fats. Minimize simple carbs, she tried the [employer] with her cousin's chuAPPrch but since she could not do the whole thing she gave up simple carbs for 21 days. Consider [employer] APP or NOOM. Without carbs she lost weight and she felt better.

## 2020-01-27 NOTE — Patient Instructions (Addendum)
Omron Blood Pressure cuff, upper arm, want BP 100-140/60-90 Pulse oximeter, want oxygen in 90s  Weekly vitals  Take Multivitamin with minerals, selenium Vitamin D 1000-2000 IU daily Probiotic with lactobacillus and bifidophilus, NOW company at [email or web address] Asprin EC 81 mg daily  Melatonin 2-5 mg at bedtime  [email or web address]  Weight Watchers APP or NOOM APP Preventive Care 23-45 Years Old, Female Preventive care refers to visits with your health care provider and lifestyle choices that can promote health and wellness. This includes:  A yearly physical exam. This may also be called an annual well check.  Regular dental visits and eye exams.  Immunizations.  Screening for certain conditions.  Healthy lifestyle choices, such as eating a healthy diet, getting regular exercise, not using drugs or products that contain nicotine and tobacco, and limiting alcohol use. What can I expect for my preventive care visit? Physical exam Your health care provider will check your:  Height and weight. This may be used to calculate body mass index (BMI), which tells if you are at a healthy weight.  Heart rate and blood pressure.  Skin for abnormal spots. Counseling Your health care provider may ask you questions about your:  Alcohol, tobacco, and drug use.  Emotional well-being.  Home and relationship well-being.  Sexual activity.  Eating habits.  Work and work Statistician.  Method of birth control.  Menstrual cycle.  Pregnancy history. What immunizations do I need?  Influenza (flu) vaccine  This is recommended every year. Tetanus, diphtheria, and pertussis (Tdap) vaccine  You may need a Td booster every 10 years. Varicella (chickenpox) vaccine  You may need this if you have not been vaccinated. Human papillomavirus (HPV) vaccine  If recommended by your health care provider, you may need three doses over 6 months. Measles,  mumps, and rubella (MMR) vaccine  You may need at least one dose of MMR. You may also need a second dose. Meningococcal conjugate (MenACWY) vaccine  One dose is recommended if you are age 76-21 years and a first-year college student living in a residence hall, or if you have one of several medical conditions. You may also need additional booster doses. Pneumococcal conjugate (PCV13) vaccine  You may need this if you have certain conditions and were not previously vaccinated. Pneumococcal polysaccharide (PPSV23) vaccine  You may need one or two doses if you smoke cigarettes or if you have certain conditions. Hepatitis A vaccine  You may need this if you have certain conditions or if you travel or work in places where you may be exposed to hepatitis A. Hepatitis B vaccine  You may need this if you have certain conditions or if you travel or work in places where you may be exposed to hepatitis B. Haemophilus influenzae type b (Hib) vaccine  You may need this if you have certain conditions. You may receive vaccines as individual doses or as more than one vaccine together in one shot (combination vaccines). Talk with your health care provider about the risks and benefits of combination vaccines. What tests do I need?  Blood tests  Lipid and cholesterol levels. These may be checked every 5 years starting at age 69.  Hepatitis C test.  Hepatitis B test. Screening  Diabetes screening. This is done by checking your blood sugar (glucose) after you have not eaten for a while (fasting).  Sexually transmitted disease (STD) testing.  BRCA-related cancer screening. This may be done if you have a family history of breast, ovarian, tubal, or peritoneal cancers.  Pelvic exam and Pap test. This may be done every 3 years starting at age 32. Starting at age 45, this may be done every 5 years if you have a Pap test in combination with an HPV test. Talk with your health care provider about your test  results, treatment options, and if necessary, the need for more tests. Follow these instructions at home: Eating and drinking   Eat a diet that includes fresh fruits and vegetables, whole grains, lean protein, and low-fat dairy.  Take vitamin and mineral supplements as recommended by your health care provider.  Do not drink alcohol if: ? Your health care provider tells you not to drink. ? You are pregnant, may be pregnant, or are planning to become pregnant.  If you drink alcohol: ? Limit how much you have to 0-1 drink a day. ? Be aware of how much alcohol is in your drink. In the [place] one drink equals one 12 oz bottle of beer (355 mL), one 5 oz glass of wine (148 mL), or one 1 oz glass of hard liquor (44 mL). Lifestyle  Take daily care of your teeth and gums.  Stay active. Exercise for at least 30 minutes on 5 or more days each week.  Do not use any products that contain nicotine or tobacco, such as cigarettes, e-cigarettes, and chewing tobacco. If you need help quitting, ask your health care provider.  If you are sexually active, practice safe sex. Use a condom or other form of birth control (contraception) in order to prevent pregnancy and STIs (sexually transmitted infections). If you plan to become pregnant, see your health care provider for a preconception visit. What's next?  Visit your health care provider once a year for a well check visit.  Ask your health care provider how often you should have your eyes and teeth checked.  Stay up to date on all vaccines. This information is not intended to replace advice given to you by your health care provider. Make sure you discuss any questions you have with your health care provider. Document Revised: 08/09/2018 Document Reviewed: 08/09/2018 Elsevier Patient Education  2020 [employer].

## 2020-01-28 LAB — URINE CULTURE
MICRO NUMBER:: 10151933
Result:: NO GROWTH
SPECIMEN QUALITY:: ADEQUATE

## 2020-01-29 ENCOUNTER — Other Ambulatory Visit: Payer: Self-pay

## 2020-01-29 ENCOUNTER — Encounter (HOSPITAL_BASED_OUTPATIENT_CLINIC_OR_DEPARTMENT_OTHER): Payer: Self-pay

## 2020-01-29 ENCOUNTER — Ambulatory Visit (HOSPITAL_BASED_OUTPATIENT_CLINIC_OR_DEPARTMENT_OTHER)
Admission: RE | Admit: 2020-01-29 | Discharge: 2020-01-29 | Disposition: A | Discharge status: Home / Self Care | Payer: 59 | Source: Ambulatory Visit | Attending: Family Medicine | Admitting: Family Medicine

## 2020-01-29 DIAGNOSIS — Z1231 Encounter for screening mammogram for malignant neoplasm of breast: Secondary | ICD-10-CM | POA: Diagnosis present

## 2020-01-29 DIAGNOSIS — Z1239 Encounter for other screening for malignant neoplasm of breast: Secondary | ICD-10-CM

## 2020-02-05 ENCOUNTER — Other Ambulatory Visit: Payer: Self-pay | Admitting: Family Medicine

## 2020-02-12 ENCOUNTER — Encounter: Payer: Self-pay | Admitting: Physician Assistant

## 2020-02-12 ENCOUNTER — Ambulatory Visit: Payer: 59 | Admitting: Physician Assistant

## 2020-02-12 VITALS — BP 138/64 | HR 60 | Temp 98.3°F | Ht 68.0 in | Wt 390.1 lb

## 2020-02-12 DIAGNOSIS — Z1211 Encounter for screening for malignant neoplasm of colon: Secondary | ICD-10-CM | POA: Diagnosis not present

## 2020-02-12 DIAGNOSIS — Z8371 Family history of colonic polyps: Secondary | ICD-10-CM | POA: Diagnosis not present

## 2020-02-12 MED ORDER — NA SULFATE-K SULFATE-MG SULF 17.5-3.13-1.6 GM/177ML PO SOLN: 1.0000 | Freq: Once | ORAL | 0 refills | Status: AC

## 2020-02-12 NOTE — Progress Notes (Signed)
I agree with the above note, plan 

## 2020-02-12 NOTE — Patient Instructions (Signed)
You have been scheduled for a colonoscopy. Please follow written instructions given to you at your visit today.  Please pick up your prep supplies at the pharmacy within the next 1-3 days. If you use inhalers (even only as needed), please bring them with you on the day of your procedure.   

## 2020-02-12 NOTE — Progress Notes (Signed)
Subjective:    Patient ID: Christina Mendez, female    DOB: Apr 25, 1976, 44 y.o.   MRN: [id number]  HPI [name] is a pleasant 44 year old African-American female, referred today by Dr. Charlett Blake  to discuss colonoscopy.  Patient is established with Dr. Ardis Hughs, had been seen here in 2016 per Alonza Bogus at that time with complaints of epigastric pain and nausea. Patient has history of morbid obesity with BMI of 59, hypertension, GERD. She has family history of colon polyps in her father, no family history of colon cancer. She has no current GI complaints, specifically no complaints of abdominal pain, changes in bowel habits melena or hematochezia.  She takes omeprazole 40 mg daily for GERD which controls her symptoms well.  She denies any dysphagia.  Review of Systems Pertinent positive and negative review of systems were noted in the above HPI section.  All other review of systems was otherwise negative.  Outpatient Encounter Medications as of 02/12/2020  Medication Sig  . cyclobenzaprine (FLEXERIL) 10 MG tablet Take 1 tablet (10 mg total) by mouth 3 (three) times daily as needed for muscle spasms.  . furosemide (LASIX) 20 MG tablet TAKE ONE TABLET BY MOUTH DAILY AS NEEDED   . losartan (COZAAR) 25 MG tablet TAKE ONE TABLET BY MOUTH ONE TIME DAILY   . metoprolol succinate (TOPROL-XL) 25 MG 24 hr tablet TAKE ONE TABLET BY MOUTH ONE TIME DAILY   . omeprazole (PRILOSEC) 40 MG capsule TAKE ONE CAPSULE BY MOUTH ONE TIME DAILY   . oxybutynin (DITROPAN XL) 5 MG 24 hr tablet Take 1 tablet (5 mg total) by mouth at bedtime.  . triamcinolone (KENALOG) 0.025 % ointment Apply 1 application topically 2 (two) times daily as needed.  . Na Sulfate-K Sulfate-Mg Sulf 17.5-3.13-1.6 GM/177ML SOLN Take 1 kit by mouth once for 1 dose.  . [DISCONTINUED] cyclobenzaprine (FLEXERIL) 10 MG tablet TAKE ONE TABLET BY MOUTH TWICE DAILY AS NEEDED FOR MUSCLES SPASMS   No facility-administered encounter medications on file as  of 02/12/2020.   Allergies  Allergen Reactions  . Skelaxin Hypertension   Patient Active Problem List   Diagnosis Date Noted  . Skin lesion of scalp 01/11/2018  . Pedal edema 07/11/2017  . Esophageal reflux 07/28/2015  . Generalized abdominal pain 07/28/2015  . Menorrhagia 05/04/2015  . Allergic rhinitis 09/05/2014  . Hyperlipidemia, mixed 08/24/2014  . Absence of bladder continence 08/24/2014  . Hyponatremia 08/24/2014  . Insomnia 04/02/2014  . Amenorrhea 02/23/2014  . Ovarian cyst 08/17/2012  . Cervical cancer screening 03/02/2012  . Allergic state 03/02/2012  . Sinusitis 01/08/2012  . Preventative health care 12/11/2011  . HTN (hypertension) 12/09/2011  . Obese 12/09/2011  . Heartburn    Social History   Socioeconomic History  . Marital status: Single    Spouse name: Not on file  . Number of children: Not on file  . Years of education: Not on file  . Highest education level: Not on file  Occupational History  . Occupation: Pharmacist, hospital   Tobacco Use  . Smoking status: Never Smoker  . Smokeless tobacco: Never Used  Substance and Sexual Activity  . Alcohol use: Yes    Comment: OCCASIONALLY  . Drug use: No  . Sexual activity: Yes    Partners: Male    Comment: No dietary restrictions, lives with Mom, Dad and brother and niece.  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 Not on file  Social History Narrative  . Not on file  Social Determinants of Health   Financial Resource Strain:   . Difficulty of Paying Living Expenses: Not on file  Food Insecurity:   . Worried About Charity fundraiser in the Last Year: Not on file  . Ran Out of Food in the Last Year: Not on file  Transportation Needs:   . Lack of Transportation (Medical): Not on file  . Lack of Transportation (Non-Medical): Not on file  Physical Activity:   . Days of Exercise per Week: Not on file  . Minutes of Exercise per Session: Not on file  Stress:   . Feeling of Stress : Not on file    Social Connections:   . Frequency of Communication with Friends and Family: Not on file  . Frequency of Social Gatherings with Friends and Family: Not on file  . Attends Religious Services: Not on file  . Active Member of Clubs or Organizations: Not on file  . Attends Archivist Meetings: Not on file  . Marital Status: Not on file  Intimate Partner Violence:   . Fear of Current or Ex-Partner: Not on file  . Emotionally Abused: Not on file  . Physically Abused: Not on file  . Sexually Abused: Not on file    Ms. Carsey's family history includes Cancer in her maternal grandfather and maternal uncle; Cholelithiasis in her father; Colon polyps in her father; Diabetes in her father, mother, and paternal grandmother; Fibromyalgia in her mother; GER disease in her father; Heart attack in her paternal grandfather and paternal grandmother; Heart attack (age of onset: 71) in her father; Heart disease in her paternal grandfather and paternal grandmother; Heart disease (age of onset: 81) in her father; Hyperlipidemia in her father; Hypertension in her brother, father, mother, paternal grandfather, and paternal grandmother; Stroke in her father; Venous thrombosis in her brother and father.      Objective:    Vitals:   02/12/20 0829  BP: 138/64  Pulse: 60  Temp: 98.3 F (36.8 C)    Physical Exam Well-developed well-nourished obese African-American female in no acute distress.  Very Pleasant height, Weight, 390 BMI 59.3  HEENT; nontraumatic normocephalic, EOMI, PER R LA, sclera anicteric. Oropharynx; not examined/mask/Covid Neck; supple, no JVD Cardiovascular; regular rate and rhythm with S1-S2, no murmur rub or gallop Pulmonary; Clear bilaterally Abdomen; soft, obese, nontender, nondistended, no palpable mass or hepatosplenomegaly, bowel sounds are active Rectal; not done Skin; benign exam, no jaundice rash or appreciable lesions Extremities; no clubbing cyanosis or edema skin  warm and dry Neuro/Psych; alert and oriented x4, grossly nonfocal mood and affect appropriate       Assessment & Plan:   #46 44 year old African-American female referred for colon cancer screening.  She is currently asymptomatic, no prior colonoscopy.  #2 family history of colon polyps/father #3 history of GERD stable #4 morbid obesity/BMI 51 #5 hypertension  Plan; Patient will be scheduled for colonoscopy with Dr. Ardis Hughs.  Procedure will be scheduled at the hospital due to BMI over 50.  Procedure was discussed in detail with the patient including indications risks and benefits and she is agreeable to proceed. As per protocol patient will be Covid tested 2 days prior to procedure.  Amy Genia Harold PA-C 02/12/2020   Cc: Mosie Lukes, MD

## 2020-02-19 ENCOUNTER — Encounter: Payer: 59 | Admitting: Internal Medicine

## 2020-02-21 ENCOUNTER — Ambulatory Visit: Payer: 59 | Attending: Internal Medicine

## 2020-02-21 DIAGNOSIS — Z23 Encounter for immunization: Secondary | ICD-10-CM

## 2020-02-21 NOTE — Progress Notes (Signed)
   Covid-19 Vaccination Clinic  Name:  Christina Mendez    MRN: [id number] DOB: 06-22-1976  02/21/2020  [name] was observed post Covid-19 immunization for 15 minutes without incident. She was provided with Vaccine Information Sheet and instruction to access the V-Safe system.   [name] was instructed to call 911 with any severe reactions post vaccine: Marland Kitchen Difficulty breathing  . Swelling of face and throat  . A fast heartbeat  . A bad rash all over body  . Dizziness and weakness   Immunizations Administered    Name Date Dose VIS Date Route   Pfizer COVID-19 Vaccine 02/21/2020  8:26 AM 0.3 mL 11/22/2019 Intramuscular   Manufacturer: [employer], [employer]   Lot: [id number]   NDC: [address]-4

## 2020-02-24 ENCOUNTER — Other Ambulatory Visit: Payer: Self-pay | Admitting: Family Medicine

## 2020-02-24 DIAGNOSIS — R600 Localized edema: Secondary | ICD-10-CM

## 2020-03-01 ENCOUNTER — Other Ambulatory Visit: Payer: Self-pay | Admitting: *Deleted

## 2020-03-01 ENCOUNTER — Encounter: Payer: Self-pay | Admitting: Family Medicine

## 2020-03-01 DIAGNOSIS — R32 Unspecified urinary incontinence: Secondary | ICD-10-CM

## 2020-03-02 ENCOUNTER — Other Ambulatory Visit: Payer: Self-pay | Admitting: Family Medicine

## 2020-03-02 MED ORDER — OXYBUTYNIN CHLORIDE ER 10 MG PO TB24: 10.0000 mg | ORAL_TABLET | Freq: Every day | ORAL | 1 refills | Status: DC

## 2020-03-05 ENCOUNTER — Encounter: Payer: Self-pay | Admitting: Family Medicine

## 2020-03-05 ENCOUNTER — Other Ambulatory Visit: Payer: Self-pay | Admitting: Family Medicine

## 2020-03-05 DIAGNOSIS — G8929 Other chronic pain: Secondary | ICD-10-CM

## 2020-03-05 MED ORDER — HYDROCODONE-ACETAMINOPHEN 5-325 MG PO TABS: 1.0000 | ORAL_TABLET | Freq: Four times a day (QID) | ORAL | 0 refills | Status: DC | PRN

## 2020-03-09 ENCOUNTER — Encounter: Payer: Self-pay | Admitting: Family Medicine

## 2020-03-09 ENCOUNTER — Telehealth: Payer: Self-pay | Admitting: Gastroenterology

## 2020-03-09 NOTE — Telephone Encounter (Signed)
The pt's procedure has been cancelled as requested.

## 2020-03-09 NOTE — Telephone Encounter (Signed)
I called patient's insurance on Friday.  Her policy states they will not cover colonoscopies under the age of 86.  (no changes to their policy with new guidelines screening age 45) They told me that pt has a $3800 deductible and would be responsible for the entire bill.  I normally just check for authorizations for the hospital procedures and the hospital checks the benefits, but noticed her age and thought I would take an additional step and get the benefits just in case there were restrictions on the policy.  I'm glad I did.  I called the patient this morning and she really appreciated the information, but does want to cancel because of financial reasons.  She will have this insurance for the rest of the year and said that if something changed, she would let us know. Thanks, Amy

## 2020-03-10 ENCOUNTER — Ambulatory Visit (HOSPITAL_BASED_OUTPATIENT_CLINIC_OR_DEPARTMENT_OTHER)
Admission: RE | Admit: 2020-03-10 | Discharge: 2020-03-10 | Disposition: A | Discharge status: Home / Self Care | Payer: 59 | Source: Ambulatory Visit | Attending: Family Medicine | Admitting: Family Medicine

## 2020-03-10 ENCOUNTER — Other Ambulatory Visit: Payer: Self-pay

## 2020-03-10 DIAGNOSIS — M545 Low back pain, unspecified: Secondary | ICD-10-CM

## 2020-03-10 DIAGNOSIS — G8929 Other chronic pain: Secondary | ICD-10-CM | POA: Diagnosis present

## 2020-03-16 ENCOUNTER — Other Ambulatory Visit (HOSPITAL_COMMUNITY): Payer: 59

## 2020-03-16 ENCOUNTER — Other Ambulatory Visit: Payer: Self-pay | Admitting: Family Medicine

## 2020-03-16 NOTE — Telephone Encounter (Signed)
Last written: 03/05/20 Last ov: 01/27/20 Next ov: 04/28/20 Contract: none UDS: none  Looks like 2 week appointment has not been set up yet.  I can get her set up, but will virtual be ok.

## 2020-03-18 ENCOUNTER — Ambulatory Visit: Payer: 59 | Attending: Internal Medicine

## 2020-03-18 DIAGNOSIS — Z23 Encounter for immunization: Secondary | ICD-10-CM

## 2020-03-18 NOTE — Progress Notes (Signed)
   Covid-19 Vaccination Clinic  Name:  KAMILLAH DIDONATO    MRN: [id number] DOB: [birth year]  03/18/2020  Ms. Currington was observed post Covid-19 immunization for 15 minutes without incident. She was provided with Vaccine Information Sheet and instruction to access the V-Safe system.   Ms. Andalon was instructed to call 911 with any severe reactions post vaccine: Marland Kitchen Difficulty breathing  . Swelling of face and throat  . A fast heartbeat  . A bad rash all over body  . Dizziness and weakness   Immunizations Administered    Name Date Dose VIS Date Route   Pfizer COVID-19 Vaccine 03/18/2020  8:15 AM 0.3 mL 11/22/2019 Intramuscular   Manufacturer: [employer], [employer]   Lot: [id number]   NDC: [address]-1

## 2020-03-19 ENCOUNTER — Encounter (HOSPITAL_COMMUNITY): Payer: Self-pay

## 2020-03-19 ENCOUNTER — Ambulatory Visit (HOSPITAL_COMMUNITY): Admit: 2020-03-19 | Payer: 59 | Admitting: Gastroenterology

## 2020-03-19 SURGERY — COLONOSCOPY WITH PROPOFOL
Anesthesia: Monitor Anesthesia Care

## 2020-03-23 ENCOUNTER — Other Ambulatory Visit: Payer: Self-pay | Admitting: Family Medicine

## 2020-03-23 DIAGNOSIS — R600 Localized edema: Secondary | ICD-10-CM

## 2020-04-08 ENCOUNTER — Other Ambulatory Visit: Payer: Self-pay | Admitting: Family Medicine

## 2020-04-08 ENCOUNTER — Encounter: Payer: Self-pay | Admitting: Family Medicine

## 2020-04-08 DIAGNOSIS — M545 Low back pain, unspecified: Secondary | ICD-10-CM

## 2020-04-08 DIAGNOSIS — G8929 Other chronic pain: Secondary | ICD-10-CM

## 2020-04-08 MED ORDER — HYDROCODONE-ACETAMINOPHEN 5-325 MG PO TABS: 1.0000 | ORAL_TABLET | Freq: Three times a day (TID) | ORAL | 0 refills | Status: DC | PRN

## 2020-04-09 ENCOUNTER — Other Ambulatory Visit: Payer: Self-pay | Admitting: Family Medicine

## 2020-04-09 DIAGNOSIS — M549 Dorsalgia, unspecified: Secondary | ICD-10-CM

## 2020-04-12 ENCOUNTER — Encounter: Payer: Self-pay | Admitting: Family Medicine

## 2020-04-15 ENCOUNTER — Other Ambulatory Visit: Payer: Self-pay

## 2020-04-15 ENCOUNTER — Other Ambulatory Visit: Payer: Self-pay | Admitting: Family Medicine

## 2020-04-15 ENCOUNTER — Ambulatory Visit (INDEPENDENT_AMBULATORY_CARE_PROVIDER_SITE_OTHER): Payer: 59 | Admitting: Family Medicine

## 2020-04-15 ENCOUNTER — Encounter: Payer: Self-pay | Admitting: Family Medicine

## 2020-04-15 VITALS — BP 126/88 | HR 83 | Ht 68.0 in | Wt 393.0 lb

## 2020-04-15 DIAGNOSIS — G8929 Other chronic pain: Secondary | ICD-10-CM | POA: Diagnosis not present

## 2020-04-15 DIAGNOSIS — M5442 Lumbago with sciatica, left side: Secondary | ICD-10-CM | POA: Diagnosis not present

## 2020-04-15 NOTE — Patient Instructions (Addendum)
Thank you for coming in today. Plan for MRI and for PT.  Recheck after MRI.  I will send the results to you right away.   Facet Blocks Patient Information  Description: The facets are joints in the spine between the vertebrae.  Like any joints in the body, facets can become irritated and painful.  Arthritis can also effect the facets.  By injecting steroids and local anesthetic in and around these joints, we can temporarily block the nerve supply to them.  Steroids act directly on irritated nerves and tissues to reduce selling and inflammation which often leads to decreased pain.  Facet blocks may be done anywhere along the spine from the neck to the low back depending upon the location of your pain.   After numbing the skin with local anesthetic (like Novocaine), a small needle is passed onto the facet joints under x-ray guidance.  You may experience a sensation of pressure while this is being done.  The entire block usually lasts about 15-25 minutes.   Conditions which may be treated by facet blocks:   Low back/buttock pain  Neck/shoulder pain  Certain types of headaches  Preparation for the injection:  1. Do not eat any solid food or dairy products within 8 hours of your appointment. 2. You may drink clear liquid up to 3 hours before appointment.  Clear liquids include water, black coffee, juice or soda.  No milk or cream please. 3. You may take your regular medication, including pain medications, with a sip of water before your appointment.  Diabetics should hold regular insulin (if taken separately) and take 1/2 normal NPH dose the morning of the procedure.  Carry some sugar containing items with you to your appointment. 4. A driver must accompany you and be prepared to drive you home after your procedure. 5. Bring all your current medications with you. 6. An IV may be inserted and sedation may be given at the discretion of the physician. 7. A blood pressure cuff, EKG and other  monitors will often be applied during the procedure.  Some patients may need to have extra oxygen administered for a short period. 8. You will be asked to provide medical information, including your allergies and medications, prior to the procedure.  We must know immediately if you are taking blood thinners (like Coumadin/Warfarin) or if you are allergic to IV iodine contrast (dye).  We must know if you could possible be pregnant.  Possible side-effects:   Bleeding from needle site  Infection (rare, may require surgery)  Nerve injury (rare)  Numbness & tingling (temporary)  Difficulty urinating (rare, temporary)  Spinal headache (a headache worse with upright posture)  Light-headedness (temporary)  Pain at injection site (serveral days)  Decreased blood pressure (rare, temporary)  Weakness in arm/leg (temporary)  Pressure sensation in back/neck (temporary)   Call if you experience:   Fever/chills associated with headache or increased back/neck pain  Headache worsened by an upright position  New onset, weakness or numbness of an extremity below the injection site  Hives or difficulty breathing (go to the emergency room)  Inflammation or drainage at the injection site(s)  Severe back/neck pain greater than usual  New symptoms which are concerning to you  Please note:  Although the local anesthetic injected can often make your back or neck feel good for several hours after the injection, the pain will likely return. It takes 3-7 days for steroids to work.  You may not notice any pain relief for at  least one week.  If effective, we will often do a series of 2-3 injections spaced 3-6 weeks apart to maximally decrease your pain.  After the initial series, you may be a candidate for a more permanent nerve block of the facets.  If you have any questions, please call #[phone]) [facility]  Referral has been placed for PT at [place] [facility] [place]. Call to schedule at [phone].

## 2020-04-15 NOTE — Progress Notes (Signed)
Subjective:    I'm seeing this patient as a consultation for:  Dr. Abner Greenspan. Note will be routed back to referring provider/PCP.  CC: Low back pain  I, Christina Mendez, LAT, ATC, am serving as scribe for Dr. [name].  HPI: Pt is a 44 y/o female presenting w/ c/o chronic low back pain for the past 10 years. Pain began to increase within the past 6 months. Was seeing a chiropractor over the last visit was in 2015. 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 Pain increases when she is standing and performing lumbar flexion. Does have radicular pain and tingling with sitting on front of a chair. Using hydrocodone and cyclobenzaprine.  She does have occasional left leg radiating pain to the lateral calf and L5.  This is much less dominant than her low back pain.  Low back pain worse with standing and activity usually better with sitting.  Sometimes pain also occurs when sleeping and rest.  No bowel or bladder dysfunction.  Patient is never had trial of physical therapy nor back MRI or injections.    Diagnostic testing: L-spine XR- 03/10/20; L-spine MRI- 06/09/10  Past medical history, Surgical history, Family history, Social history, Allergies, and medications have been entered into the medical record, reviewed.   Review of Systems: No new headache, visual changes, nausea, vomiting, diarrhea, constipation, dizziness, abdominal pain, skin rash, fevers, chills, night sweats, weight loss, swollen lymph nodes, body aches, joint swelling, muscle aches, chest pain, shortness of breath, mood changes, visual or auditory hallucinations.   Objective:    Vitals:   04/15/20 0859  BP: 126/88  Pulse: 83  SpO2: 99%   General: Morbidly obese, well nourished, and in no acute distress.  Neuro/Psych: Alert and oriented x3, extra-ocular muscles intact, able to move all 4 extremities, sensation grossly intact. Skin: Warm and dry, no rashes noted.  Respiratory: Not using accessory muscles, speaking in full  sentences, trachea midline.  Cardiovascular: Pulses palpable, no extremity edema. Abdomen: Does not appear distended. MSK: L-spine normal-appearing nontender midline.  Mildly tender palpation lumbar paraspinal musculature. Normal lumbar motion. Lower extremity strength reflexes and sensation is intact. Mildly positive left-sided slump test.  Lab and Radiology Results EXAM: LUMBAR SPINE - 2-3 VIEW  COMPARISON:  None.  FINDINGS: Frontal, lateral, and spot lumbosacral lateral images were obtained. There are 5 non-rib-bearing lumbar type vertebral bodies. There is moderately severe disc space narrowing at L4-5 and L5-S1. Other disc spaces appear normal. Anterior osteophytes are noted at L2, L3, L4, and L5. No erosive change.  IMPRESSION: Moderately severe disc space narrowing at L4-5 and L5-S1. No fracture or spondylolisthesis.   Electronically Signed   By: Bretta Bang III M.D.   On: 03/11/2020 08:28  I, [name], personally (independently) visualized and performed the interpretation of the images attached in this note.   Impression and Recommendations:    Assessment and Plan: 44 y.o. female with chronic low back pain worse over the last 6 months with mild left L5 radiculopathy.  Patient has significant degenerative change especially at L4 and L5 on recent lumbar spine x-ray.  We will plan for limited trial of physical therapy.  I am not optimistic that this will completely control her pain but may improve it back to her baseline 6 months ago.  We will independently start progression to MRI for facet injection planning.  Patient has had physician today conservative management for years now certainly greater than 6 weeks.  Should be eligible  for MRI.  Recheck following MRI  PDMP not reviewed this encounter. Orders Placed This Encounter  Procedures  . MR Lumbar Spine Wo Contrast    Standing Status:   Future    Standing Expiration Date:   06/15/2021    Order  Specific Question:   What is the patient's sedation requirement?    Answer:   No Sedation    Order Specific Question:   Does the patient have a pacemaker or implanted devices?    Answer:   No    Order Specific Question:   Preferred imaging location?    Answer:   [facility] (table limit-550lbs)    Order Specific Question:   Radiology Contrast Protocol - do NOT remove file path    Answer:   [email or web address]  . Ambulatory referral to Physical Therapy    Referral Priority:   Routine    Referral Type:   Physical Medicine    Referral Reason:   Specialty Services Required    Requested Specialty:   Physical Therapy   No orders of the defined types were placed in this encounter.   Discussed warning signs or symptoms. Please see discharge instructions. Patient expresses understanding.   The above documentation has been reviewed and is accurate and complete Christina Mendez

## 2020-04-16 ENCOUNTER — Telehealth: Payer: Self-pay | Admitting: *Deleted

## 2020-04-16 NOTE — Telephone Encounter (Signed)
Received fax from [facility] stating that they do not take patients insurance and she cannot afford to pay out of pocket.  Advised patient that she needs to call insurance to see if they would be able tell her what chiropractic offices are covered.  She can either call their office because usually you do not need a referral or call us back so we can send referral to an office that her insurance covers.

## 2020-04-23 ENCOUNTER — Other Ambulatory Visit: Payer: Self-pay | Admitting: Family Medicine

## 2020-04-23 DIAGNOSIS — R600 Localized edema: Secondary | ICD-10-CM

## 2020-04-28 ENCOUNTER — Ambulatory Visit: Payer: 59 | Admitting: Family Medicine

## 2020-04-28 ENCOUNTER — Other Ambulatory Visit: Payer: Self-pay

## 2020-04-28 VITALS — BP 134/85 | HR 79 | Temp 97.7°F | Resp 12 | Ht 68.0 in | Wt 395.4 lb

## 2020-04-28 DIAGNOSIS — M545 Low back pain: Secondary | ICD-10-CM

## 2020-04-28 DIAGNOSIS — Z79899 Other long term (current) drug therapy: Secondary | ICD-10-CM | POA: Diagnosis not present

## 2020-04-28 DIAGNOSIS — E669 Obesity, unspecified: Secondary | ICD-10-CM

## 2020-04-28 DIAGNOSIS — M549 Dorsalgia, unspecified: Secondary | ICD-10-CM | POA: Insufficient documentation

## 2020-04-28 DIAGNOSIS — E782 Mixed hyperlipidemia: Secondary | ICD-10-CM

## 2020-04-28 DIAGNOSIS — I10 Essential (primary) hypertension: Secondary | ICD-10-CM | POA: Diagnosis not present

## 2020-04-28 DIAGNOSIS — G8929 Other chronic pain: Secondary | ICD-10-CM

## 2020-04-28 DIAGNOSIS — N3281 Overactive bladder: Secondary | ICD-10-CM

## 2020-04-28 MED ORDER — CYCLOBENZAPRINE HCL 10 MG PO TABS: 10.0000 mg | ORAL_TABLET | Freq: Three times a day (TID) | ORAL | 2 refills | Status: DC | PRN

## 2020-04-28 MED ORDER — HYDROCODONE-ACETAMINOPHEN 5-325 MG PO TABS: 1.0000 | ORAL_TABLET | Freq: Three times a day (TID) | ORAL | 0 refills | Status: DC | PRN

## 2020-04-28 NOTE — Assessment & Plan Note (Signed)
Well controlled, no changes to meds. Encouraged heart healthy diet such as the DASH diet and exercise as tolerated.  °

## 2020-04-28 NOTE — Assessment & Plan Note (Signed)
Is following with sports medicine now. Has an MRI scheduled this week to further investigate. Will try to minimize the hydrocodone to once or  None a day and increase the Cyclobenzaprine which does not make her sleepy up to bid or tid as needed. Then add an ES Tylenol to each dose of Flexeril. Keep active

## 2020-04-28 NOTE — Progress Notes (Signed)
Subjective:    Patient ID: Christina Mendez, female    DOB: 1976/05/26, 44 y.o.   MRN: [id number]  Chief Complaint  Patient presents with  . 3 month follow up    HPI Patient is in today for follow up on chronic medical concerns. No recent febrile illness or hospitalizations. She has generally been feeling well. Her greatest concern is her low back pain. She has been seen by Sports Medicine and is scheduled for an MRI this weekend to further evaluate. Denies CP/palp/SOB/HA/congestion/fevers/GI or GU c/o. Taking meds as prescribed  Past Medical History:  Diagnosis Date  . Allergic state 03/02/2012  . Allergy last couple of years   seasonal  . Cervical cancer screening 03/02/2012  . Chicken pox 3rd grade  . GERD (gastroesophageal reflux disease)   . Heartburn   . HTN (hypertension) 12/09/2011  . Hyperlipidemia   . Hypertension   . Inflamed skin tag 03/02/2012  . Menorrhagia 05/04/2015  . Obese 12/09/2011  . OSA (obstructive sleep apnea)   . Ovarian cyst 08/17/2012  . Pedal edema 07/11/2017  . Sinusitis 01/08/2012    Past Surgical History:  Procedure Laterality Date  . cyst removed  44 yr old   benign, abdominal    Family History  Problem Relation Age of Onset  . Fibromyalgia Mother   . Diabetes Mother        type 2  . Hypertension Mother   . Diabetes Father        type 2  . Heart disease Father 63       2 MI  . Hypertension Father   . Heart attack Father 23       X 2  . Stroke Father        mini strokes  . GER disease Father   . Hyperlipidemia Father   . Cholelithiasis Father   . Venous thrombosis Father        in liver  . Colon polyps Father   . Hypertension Brother   . Venous thrombosis Brother        on spine  . Cancer Maternal Grandfather        stomach  . Hypertension Paternal Grandmother   . Diabetes Paternal Grandmother        type 2  . Heart attack Paternal Grandmother   . Heart disease Paternal Grandmother   . Heart attack Paternal Grandfather    . Heart disease Paternal Grandfather   . Hypertension Paternal Grandfather   . Cancer Maternal Uncle        prostate cancer    Social History   Socioeconomic History  . Marital status: Single    Spouse name: Not on file  . Number of children: Not on file  . Years of education: Not on file  . Highest education level: Not on file  Occupational History  . Occupation: Runner, broadcasting/film/video   Tobacco Use  . Smoking status: Never Smoker  . Smokeless tobacco: Never Used  Substance and Sexual Activity  . Alcohol use: Yes    Comment: OCCASIONALLY  . Drug use: No  . Sexual activity: Yes    Partners: Male    Comment: No dietary restrictions, lives with Mom, Dad and brother and niece.  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 Not on file  Social History Narrative  . Not on file   Social Determinants of Health   Financial Resource Strain:   . Difficulty of Paying Living Expenses:  Food Insecurity:   . Worried About Programme researcher, broadcasting/film/video in the Last Year:   . Barista in the Last Year:   Transportation Needs:   . Freight forwarder (Medical):   Marland Kitchen Lack of Transportation (Non-Medical):   Physical Activity:   . Days of Exercise per Week:   . Minutes of Exercise per Session:   Stress:   . Feeling of Stress :   Social Connections:   . Frequency of Communication with Friends and Family:   . Frequency of Social Gatherings with Friends and Family:   . Attends Religious Services:   . Active Member of Clubs or Organizations:   . Attends Banker Meetings:   Marland Kitchen Marital Status:   Intimate Partner Violence:   . Fear of Current or Ex-Partner:   . Emotionally Abused:   Marland Kitchen Physically Abused:   . Sexually Abused:     Outpatient Medications Prior to Visit  Medication Sig Dispense Refill  . furosemide (LASIX) 20 MG tablet TAKE ONE TABLET BY MOUTH ONE TIME DAILY AS NEEDED  30 tablet 0  . losartan (COZAAR) 25 MG tablet TAKE ONE TABLET BY MOUTH ONE TIME DAILY  90 tablet  0  . metoprolol succinate (TOPROL-XL) 25 MG 24 hr tablet TAKE ONE TABLET BY MOUTH ONE TIME DAILY  90 tablet 0  . omeprazole (PRILOSEC) 40 MG capsule TAKE ONE CAPSULE BY MOUTH ONE TIME DAILY  90 capsule 0  . oxybutynin (DITROPAN XL) 10 MG 24 hr tablet Take 1 tablet (10 mg total) by mouth at bedtime. 30 tablet 1  . triamcinolone (KENALOG) 0.025 % ointment Apply 1 application topically 2 (two) times daily as needed. 30 g 1  . cyclobenzaprine (FLEXERIL) 10 MG tablet TAKE ONE TABLET BY MOUTH TWICE DAILY as needed for muscle spasms 30 tablet 0  . HYDROcodone-acetaminophen (NORCO/VICODIN) 5-325 MG tablet Take 1 tablet by mouth 3 (three) times daily as needed for moderate pain. 20 tablet 0   No facility-administered medications prior to visit.    Allergies  Allergen Reactions  . Skelaxin Hypertension    Review of Systems  Constitutional: Negative for fever and malaise/fatigue.  HENT: Negative for congestion.   Eyes: Negative for blurred vision.  Respiratory: Negative for shortness of breath.   Cardiovascular: Negative for chest pain, palpitations and leg swelling.  Gastrointestinal: Negative for abdominal pain, blood in stool and nausea.  Genitourinary: Negative for dysuria and frequency.  Musculoskeletal: Positive for back pain and myalgias. Negative for falls.  Skin: Negative for rash.  Neurological: Negative for dizziness, loss of consciousness and headaches.  Endo/Heme/Allergies: Negative for environmental allergies.  Psychiatric/Behavioral: Negative for depression. The patient is not nervous/anxious.        Objective:    Physical Exam Vitals and nursing note reviewed.  Constitutional:      General: She is not in acute distress.    Appearance: She is well-developed.  HENT:     Head: Normocephalic and atraumatic.     Nose: Nose normal.  Eyes:     General:        Right eye: No discharge.        Left eye: No discharge.  Cardiovascular:     Rate and Rhythm: Normal rate and  regular rhythm.     Heart sounds: No murmur.  Pulmonary:     Effort: Pulmonary effort is normal.     Breath sounds: Normal breath sounds.  Abdominal:     General: Bowel sounds are  normal.     Palpations: Abdomen is soft.     Tenderness: There is no abdominal tenderness.  Musculoskeletal:     Cervical back: Normal range of motion and neck supple.  Skin:    General: Skin is warm and dry.  Neurological:     Mental Status: She is alert and oriented to person, place, and time.     BP 134/85 (BP Location: Left Wrist, Cuff Size: Normal)   Pulse 79   Temp 97.7 F (36.5 C) (Temporal)   Resp 12   Ht 5\' 8"  (1.727 m)   Wt (!) 395 lb 6.4 oz (179.4 kg)   SpO2 98%   BMI 60.12 kg/m  Wt Readings from Last 3 Encounters:  04/28/20 (!) 395 lb 6.4 oz (179.4 kg)  04/15/20 (!) 393 lb (178.3 kg)  02/12/20 (!) 390 lb 2 oz (177 kg)    Diabetic Foot Exam - Simple   No data filed     Lab Results  Component Value Date   WBC 5.2 01/27/2020   HGB 13.9 01/27/2020   HCT 41.6 01/27/2020   PLT 268.0 01/27/2020   GLUCOSE 90 01/27/2020   CHOL 189 01/27/2020   TRIG 151.0 (H) 01/27/2020   HDL 52.00 01/27/2020   LDLCALC 107 (H) 01/27/2020   ALT 20 01/27/2020   AST 16 01/27/2020   NA 139 01/27/2020   K 3.9 01/27/2020   CL 104 01/27/2020   CREATININE 0.78 01/27/2020   BUN 10 01/27/2020   CO2 29 01/27/2020   TSH 0.74 01/27/2020    Lab Results  Component Value Date   TSH 0.74 01/27/2020   Lab Results  Component Value Date   WBC 5.2 01/27/2020   HGB 13.9 01/27/2020   HCT 41.6 01/27/2020   MCV 89.2 01/27/2020   PLT 268.0 01/27/2020   Lab Results  Component Value Date   NA 139 01/27/2020   K 3.9 01/27/2020   CO2 29 01/27/2020   GLUCOSE 90 01/27/2020   BUN 10 01/27/2020   CREATININE 0.78 01/27/2020   BILITOT 0.4 01/27/2020   ALKPHOS 92 01/27/2020   AST 16 01/27/2020   ALT 20 01/27/2020   PROT 6.9 01/27/2020   ALBUMIN 4.0 01/27/2020   CALCIUM 8.9 01/27/2020   GFR 96.61  01/27/2020   Lab Results  Component Value Date   CHOL 189 01/27/2020   Lab Results  Component Value Date   HDL 52.00 01/27/2020   Lab Results  Component Value Date   LDLCALC 107 (H) 01/27/2020   Lab Results  Component Value Date   TRIG 151.0 (H) 01/27/2020   Lab Results  Component Value Date   CHOLHDL 4 01/27/2020   No results found for: HGBA1C     Assessment & Plan:   Problem List Items Addressed This Visit    HTN (hypertension)    Well controlled, no changes to meds. Encouraged heart healthy diet such as the DASH diet and exercise as tolerated.       Obese    Referred to healthy and wellness and encouraged small frequent meals with only carb and complex and increased protein.       Relevant Orders   Amb Ref to Medical Weight Management   Hyperlipidemia, mixed    Encouraged heart healthy diet, increase exercise, avoid trans fats, consider a krill oil cap daily      Back pain    Is following with sports medicine now. Has an MRI scheduled this week to further investigate. Will try  to minimize the hydrocodone to once or  None a day and increase the Cyclobenzaprine which does not make her sleepy up to bid or tid as needed. Then add an ES Tylenol to each dose of Flexeril. Keep active      Relevant Medications   HYDROcodone-acetaminophen (NORCO/VICODIN) 5-325 MG tablet   cyclobenzaprine (FLEXERIL) 10 MG tablet   OAB (overactive bladder)    Is some improved with Oxybutynin and no major side effects. Is seeing [facility] soon         I have changed Marialuisa L. Berroa's cyclobenzaprine. I am also having her maintain her triamcinolone, losartan, oxybutynin, metoprolol succinate, omeprazole, furosemide, and HYDROcodone-acetaminophen.  Meds ordered this encounter  Medications  . HYDROcodone-acetaminophen (NORCO/VICODIN) 5-325 MG tablet    Sig: Take 1 tablet by mouth 3 (three) times daily as needed for moderate pain.    Dispense:  30 tablet    Refill:  0  .  cyclobenzaprine (FLEXERIL) 10 MG tablet    Sig: Take 1 tablet (10 mg total) by mouth 3 (three) times daily as needed for muscle spasms.    Dispense:  90 tablet    Refill:  2     Penni Homans, MD

## 2020-04-28 NOTE — Assessment & Plan Note (Addendum)
Is some improved with Oxybutynin and no major side effects. Is seeing [facility] soon

## 2020-04-28 NOTE — Assessment & Plan Note (Signed)
Referred to healthy and wellness and encouraged small frequent meals with only carb and complex and increased protein.

## 2020-04-28 NOTE — Patient Instructions (Signed)
The mRNA technology has been in development for 20 years and we already had the Coronavirus family of viruses (which usually just cause the common cold) genetically mapped already which is why we were able to come up with viable vaccine candidates so quickly in stage 1, then stage 2 scientifically took the correct amount of time what we did to speed it up was just build the manufacturing platform at the same time we were running the experiments so if it worked we could produce faster. And stage 3 has now had many months and millions of people immunized and we are seeing the immunity hold for over 9 months now with sign of it dissipating and no significant numbers of adverse reactions.  During every flu season we see 2 anaphylactic reactions for every million shots given and we initially thought we would see 11 per million with the COVID vaccine but now we see only 2-3 with Moderna and 5 or so with [place] so compared to someone is dying every 20 minutes from [place] and more deadly and infectious strains are coming it is definitely best when weighing the risks and benefits to take the shots.  Another pooled analysis of the 5 most utilized vaccines in the world shows that after full immunization so far no one has died from [place].    Hypertension, Adult High blood pressure (hypertension) is when the force of blood pumping through the arteries is too strong. The arteries are the blood vessels that carry blood from the heart throughout the body. Hypertension forces the heart to work harder to pump blood and may cause arteries to become narrow or stiff. Untreated or uncontrolled hypertension can cause a heart attack, heart failure, a stroke, kidney disease, and other problems. A blood pressure reading consists of a higher number over a lower number. Ideally, your blood pressure should be below 120/80. The first ("top") number is called the systolic pressure. It is a measure of the pressure in your arteries as your  heart beats. The second ("bottom") number is called the diastolic pressure. It is a measure of the pressure in your arteries as the heart relaxes. What are the causes? The exact cause of this condition is not known. There are some conditions that result in or are related to high blood pressure. What increases the risk? Some risk factors for high blood pressure are under your control. The following factors may make you more likely to develop this condition:  Smoking.  Having type 2 diabetes mellitus, high cholesterol, or both.  Not getting enough exercise or physical activity.  Being overweight.  Having too much fat, sugar, calories, or salt (sodium) in your diet.  Drinking too much alcohol. Some risk factors for high blood pressure may be difficult or impossible to change. Some of these factors include:  Having chronic kidney disease.  Having a family history of high blood pressure.  Age. Risk increases with age.  Race. You may be at higher risk if you are African American.  Gender. Men are at higher risk than women before age 39. After age 66, women are at higher risk than men.  Having obstructive sleep apnea.  Stress. What are the signs or symptoms? High blood pressure may not cause symptoms. Very high blood pressure (hypertensive crisis) may cause:  Headache.  Anxiety.  Shortness of breath.  Nosebleed.  Nausea and vomiting.  Vision changes.  Severe chest pain.  Seizures. How is this diagnosed? This condition is diagnosed by measuring your blood  pressure while you are seated, with your arm resting on a flat surface, your legs uncrossed, and your feet flat on the floor. The cuff of the blood pressure monitor will be placed directly against the skin of your upper arm at the level of your heart. It should be measured at least twice using the same arm. Certain conditions can cause a difference in blood pressure between your right and left arms. Certain factors can  cause blood pressure readings to be lower or higher than normal for a short period of time:  When your blood pressure is higher when you are in a health care provider's office than when you are at home, this is called white coat hypertension. Most people with this condition do not need medicines.  When your blood pressure is higher at home than when you are in a health care provider's office, this is called masked hypertension. Most people with this condition may need medicines to control blood pressure. If you have a high blood pressure reading during one visit or you have normal blood pressure with other risk factors, you may be asked to:  Return on a different day to have your blood pressure checked again.  Monitor your blood pressure at home for 1 week or longer. If you are diagnosed with hypertension, you may have other blood or imaging tests to help your health care provider understand your overall risk for other conditions. How is this treated? This condition is treated by making healthy lifestyle changes, such as eating healthy foods, exercising more, and reducing your alcohol intake. Your health care provider may prescribe medicine if lifestyle changes are not enough to get your blood pressure under control, and if:  Your systolic blood pressure is above 130.  Your diastolic blood pressure is above 80. Your personal target blood pressure may vary depending on your medical conditions, your age, and other factors. Follow these instructions at home: Eating and drinking   Eat a diet that is high in fiber and potassium, and low in sodium, added sugar, and fat. An example eating plan is called the DASH (Dietary Approaches to Stop Hypertension) diet. To eat this way: ? Eat plenty of fresh fruits and vegetables. Try to fill one half of your plate at each meal with fruits and vegetables. ? Eat whole grains, such as whole-wheat pasta, brown rice, or whole-grain bread. Fill about one fourth of  your plate with whole grains. ? Eat or drink low-fat dairy products, such as skim milk or low-fat yogurt. ? Avoid fatty cuts of meat, processed or cured meats, and poultry with skin. Fill about one fourth of your plate with lean proteins, such as fish, chicken without skin, beans, eggs, or tofu. ? Avoid pre-made and processed foods. These tend to be higher in sodium, added sugar, and fat.  Reduce your daily sodium intake. Most people with hypertension should eat less than 1,500 mg of sodium a day.  Do not drink alcohol if: ? Your health care provider tells you not to drink. ? You are pregnant, may be pregnant, or are planning to become pregnant.  If you drink alcohol: ? Limit how much you use to:  0-1 drink a day for women.  0-2 drinks a day for men. ? Be aware of how much alcohol is in your drink. In the [place] one drink equals one 12 oz bottle of beer (355 mL), one 5 oz glass of wine (148 mL), or one 1 oz glass of hard liquor (44  mL). Lifestyle   Work with your health care provider to maintain a healthy body weight or to lose weight. Ask what an ideal weight is for you.  Get at least 30 minutes of exercise most days of the week. Activities may include walking, swimming, or biking.  Include exercise to strengthen your muscles (resistance exercise), such as Pilates or lifting weights, as part of your weekly exercise routine. Try to do these types of exercises for 30 minutes at least 3 days a week.  Do not use any products that contain nicotine or tobacco, such as cigarettes, e-cigarettes, and chewing tobacco. If you need help quitting, ask your health care provider.  Monitor your blood pressure at home as told by your health care provider.  Keep all follow-up visits as told by your health care provider. This is important. Medicines  Take over-the-counter and prescription medicines only as told by your health care provider. Follow directions carefully. Blood pressure medicines must  be taken as prescribed.  Do not skip doses of blood pressure medicine. Doing this puts you at risk for problems and can make the medicine less effective.  Ask your health care provider about side effects or reactions to medicines that you should watch for. Contact a health care provider if you:  Think you are having a reaction to a medicine you are taking.  Have headaches that keep coming back (recurring).  Feel dizzy.  Have swelling in your ankles.  Have trouble with your vision. Get help right away if you:  Develop a severe headache or confusion.  Have unusual weakness or numbness.  Feel faint.  Have severe pain in your chest or abdomen.  Vomit repeatedly.  Have trouble breathing. Summary  Hypertension is when the force of blood pumping through your arteries is too strong. If this condition is not controlled, it may put you at risk for serious complications.  Your personal target blood pressure may vary depending on your medical conditions, your age, and other factors. For most people, a normal blood pressure is less than 120/80.  Hypertension is treated with lifestyle changes, medicines, or a combination of both. Lifestyle changes include losing weight, eating a healthy, low-sodium diet, exercising more, and limiting alcohol. This information is not intended to replace advice given to you by your health care provider. Make sure you discuss any questions you have with your health care provider. Document Revised: 08/08/2018 Document Reviewed: 08/08/2018 Elsevier Patient Education  2020 [employer].

## 2020-04-28 NOTE — Assessment & Plan Note (Signed)
Encouraged heart healthy diet, increase exercise, avoid trans fats, consider a krill oil cap daily 

## 2020-04-29 NOTE — Addendum Note (Signed)
Addended by: Thelma Barge D on: 04/29/2020 08:35 AM   Modules accepted: Orders

## 2020-05-03 ENCOUNTER — Ambulatory Visit
Admission: RE | Admit: 2020-05-03 | Discharge: 2020-05-03 | Disposition: A | Discharge status: Home / Self Care | Payer: 59 | Source: Ambulatory Visit | Attending: Family Medicine | Admitting: Family Medicine

## 2020-05-03 DIAGNOSIS — G8929 Other chronic pain: Secondary | ICD-10-CM

## 2020-05-04 ENCOUNTER — Other Ambulatory Visit: Payer: Self-pay | Admitting: Family Medicine

## 2020-05-04 NOTE — Progress Notes (Signed)
MRI shows multiple level facet arthritis and evidence of bulging disc impacting nerve roots.  Please schedule follow-up in person in the near future to discuss MRI results and plan for next steps.

## 2020-05-06 ENCOUNTER — Other Ambulatory Visit: Payer: Self-pay

## 2020-05-06 ENCOUNTER — Encounter: Payer: Self-pay | Admitting: Physical Therapy

## 2020-05-06 ENCOUNTER — Ambulatory Visit: Payer: 59 | Attending: Family Medicine | Admitting: Physical Therapy

## 2020-05-06 DIAGNOSIS — M545 Low back pain: Secondary | ICD-10-CM | POA: Insufficient documentation

## 2020-05-06 DIAGNOSIS — R2689 Other abnormalities of gait and mobility: Secondary | ICD-10-CM | POA: Diagnosis present

## 2020-05-06 DIAGNOSIS — G8929 Other chronic pain: Secondary | ICD-10-CM

## 2020-05-06 DIAGNOSIS — M6281 Muscle weakness (generalized): Secondary | ICD-10-CM | POA: Insufficient documentation

## 2020-05-06 NOTE — Therapy (Signed)
[place], [place], [address] Phone: [phone]   Fax:  [phone]  Physical Therapy Evaluation  Patient Details  Name: Christina Mendez MRN: [id number] Date of Birth: 07/13/1976 Referring Provider (PT): Gregor Hams, MD   Encounter Date: 05/06/2020  PT End of Session - 05/06/20 0859    Visit Number  1    Number of Visits  8    Date for PT Re-Evaluation  07/01/20    Authorization Type  [employer]    PT Start Time  0900    PT Stop Time  0945    PT Time Calculation (min)  45 min    Activity Tolerance  Patient tolerated treatment well    Behavior During Therapy  [facility] for tasks assessed/performed       Past Medical History:  Diagnosis Date  . Allergic state 03/02/2012  . Allergy last couple of years   seasonal  . Cervical cancer screening 03/02/2012  . Chicken pox 3rd grade  . GERD (gastroesophageal reflux disease)   . Heartburn   . HTN (hypertension) 12/09/2011  . Hyperlipidemia   . Hypertension   . Inflamed skin tag 03/02/2012  . Menorrhagia 05/04/2015  . Obese 12/09/2011  . OSA (obstructive sleep apnea)   . Ovarian cyst 08/17/2012  . Pedal edema 07/11/2017  . Sinusitis 01/08/2012    Past Surgical History:  Procedure Laterality Date  . cyst removed  44 yr old   benign, abdominal    There were no vitals filed for this visit.   Subjective Assessment - 05/06/20 0858    Subjective  Patient reports lower back pain with pretty much every movement. She has been having back pain for years, in 2014 or 2015 she went to see a doctor who treated her with a inversion table and that helped until 6-8 months ago. Around 6-8 months ago she started having discomfort around the lower back and it feels like the muscles are very tight especially when standing. She denies any specific mechanism of injury to start the most recent back pain. She is not having any radiating pain into her legs. She notes that if  she is on her feet for an extended period of time and she touches her lower back the the area will feel numb.    Pertinent History  BMI, HTN, chronic low back pain    Limitations  Lifting;Standing;Walking;House hold activities    How long can you sit comfortably?  No limitation, hard surfaces cause pain    How long can you stand comfortably?  5 minutes    How long can you walk comfortably?  < 5 minuts    Diagnostic tests  X-ray, MRI    Patient Stated Goals  Improve back pain and mobility    Currently in Pain?  Yes    Pain Score  0-No pain    Pain Location  Back    Pain Orientation  Lower   L > R   Pain Descriptors / Indicators  Aching;Tightness    Pain Type  Chronic pain    Pain Radiating Towards  Denies any radiating pain    Pain Onset  More than a month ago    Pain Frequency  Intermittent    Aggravating Factors   Standing, walking, patient states that every movement is a chore    Pain Relieving Factors  Sitting/rest    Effect of Pain on Daily Activities  Patient limited in activities that require  standing and walk         [facility] 0001      Assessment   Medical Diagnosis  Chronic bilateral low back pain with left-sided sciatica    Referring Provider (PT)  Rodolph Bongorey, Evan S, MD    Onset Date/Surgical Date  --   > 10 years ago, worsened over past 6 months   Hand Dominance  Right    Next MD Visit  05/13/2020    Prior Therapy  None - has had chiropractic care      Precautions   Precautions  None      Restrictions   Weight Bearing Restrictions  No      Balance Screen   Has the patient fallen in the past 6 months  No    Has the patient had a decrease in activity level because of a fear of falling?   No    Is the patient reluctant to leave their home because of a fear of falling?   No      Home Public house managernvironment   Living Environment  Private residence    Living Arrangements  Parent    Type of Home  House    Home Access  Stairs to enter    Entrance  Stairs-Number of Steps  2      Prior Function   Level of Independence  Independent    Vocation  Full time employment    Vocation Requirements  Mostly sedentary, she is 1-on-1 for adults with disabilities, helps clients with everyday needs    Leisure  Reading      Cognition   Overall Cognitive Status  Within Functional Limits for tasks assessed      Observation/Other Assessments   Observations  Patient appears in no apparent distress    Focus on Therapeutic Outcomes (FOTO)   60% limitation      Sensation   Light Touch  Appears Intact      ROM / Strength   AROM / PROM / Strength  AROM;PROM;Strength      AROM   AROM Assessment Site  Lumbar    Lumbar Flexion  Able to touch toes but with limited lumbar motion    Lumbar Extension  WFL    Lumbar - Right Side Bend  WFL    Lumbar - Left Side Bend  WFL    Lumbar - Right Rotation  WFL    Lumbar - Left Rotation  [facility]      Strength   Strength Assessment Site  Hip;Knee    Right/Left Hip  Right;Left    Right Hip Flexion  4/5    Right Hip Extension  4-/5    Right Hip ABduction  3+/5    Left Hip Flexion  4/5    Left Hip Extension  4-/5    Left Hip ABduction  3+/5    Right/Left Knee  Right;Left    Right Knee Flexion  5/5    Right Knee Extension  5/5    Left Knee Flexion  5/5    Left Knee Extension  5/5      Flexibility   Soft Tissue Assessment /Muscle Length  yes    Hamstrings  WFL    Quadriceps  Limited bilaterally   hip flexor     Palpation   Palpation comment  Mild TTP to bilateral lumbar paraspinals      Special Tests    Special Tests  Lumbar    Lumbar Tests  Slump Test;Straight  Leg Raise      Slump test   Findings  Negative      Straight Leg Raise   Findings  Negative      Transfers   Transfers  Independent with all Transfers      Ambulation/Gait   Ambulation/Gait  Yes    Ambulation/Gait Assistance  7: Independent    Gait Comments  Bilateral toe out, increased lumbar lordosis                   Objective measurements completed on examination: See above findings.      [facility] Adult PT Treatment/Exercise - 05/06/20 0001      Exercises   Exercises  Lumbar      Lumbar Exercises: Stretches   Hip Flexor Stretch  2 reps;30 seconds    Hip Flexor Stretch Limitations  supine edge of table      Lumbar Exercises: Seated   Other Seated Lumbar Exercises  Posterior pelvic tilt x10      Lumbar Exercises: Supine   Pelvic Tilt  10 reps;5 seconds    Pelvic Tilt Limitations  posterior pelvic tilt with feet elevated on swiss ball      Lumbar Exercises: Sidelying   Clam  20 reps             PT Education - 05/06/20 0859    Education Details  Exam findings, POC, HEP    Person(s) Educated  Patient    Methods  Explanation;Demonstration;Tactile cues;Verbal cues;Handout    Comprehension  Verbalized understanding;Returned demonstration;Verbal cues required;Tactile cues required;Need further instruction       PT Short Term Goals - 05/06/20 1444      PT SHORT TERM GOAL #1   Title  Patient will be I with initial HEP to maintain progress with PT    Time  4    Period  Weeks    Status  New    Target Date  06/03/20      PT SHORT TERM GOAL #2   Title  Patient will be able to walk and stand >/= 10 minutes with needing seated break    Time  4    Period  Weeks    Status  New    Target Date  06/03/20        PT Long Term Goals - 05/06/20 1445      PT LONG TERM GOAL #1   Title  Patient will be I with final HEP to maintain progress from PT    Time  8    Period  Weeks    Status  New    Target Date  07/01/20      PT LONG TERM GOAL #2   Title  Patient will exhibit improved core and hip strength to >/= 4/5 MMT to improve postural control and reduce pain with walking    Time  8    Period  Weeks    Status  New    Target Date  07/01/20      PT LONG TERM GOAL #3   Title  Patient will be able to stand and walk >/= 30 minutes without needing seated break     Time  8    Period  Weeks    Status  New    Target Date  07/01/20      PT LONG TERM GOAL #4   Title  Patient will be able to perform all dressing and self care with no difficulty  Time  8    Period  Weeks    Status  New    Target Date  07/01/20      PT LONG TERM GOAL #5   Title  Patient will report improved functional ability of </= 45% limitation on FOTO    Time  8    Period  Weeks    Status  New    Target Date  07/01/20             Plan - 05/06/20 2423    Clinical Impression Statement  Patient presents to PT with report of chronic lower back pain with standing and walking activities. She exhibits increased lumbar lordosis with lumbar extensor and hip flexor tightness. Her symptoms do not seem radicular in nature as she denies any nerve related pain and all neuro testing was negative. She also demonstrates limited lumbar flexion AROM with forward bend and core/gluteal weakness. Patient was provided exercises to reduce lumbar extensor/hip flexor tightness and initiate core strengthening, and she would benefit from continued skilled PT to reduce lower back pain with standing and walking by improve core and gluteal control and reducing lower back tightness.    Personal Factors and Comorbidities  Fitness;Past/Current Experience;Time since onset of injury/illness/exacerbation;Comorbidity 2    Comorbidities  BMI, chronic low back pain    Examination-Activity Limitations  Bathing;Bend;Dressing;Hygiene/Grooming;Lift;Stand;Locomotion Level    Examination-Participation Restrictions  Meal Prep;Cleaning;Community Activity;Shop    Stability/Clinical Decision Making  Stable/Uncomplicated    Clinical Decision Making  Low    Rehab Potential  Good    PT Frequency  1x / week    PT Duration  8 weeks    PT Treatment/Interventions  ADLs/Self Care Home Management;Cryotherapy;Electrical Stimulation;Moist Heat;Traction;Neuromuscular re-education;Therapeutic exercise;Therapeutic activities;Functional  mobility training;Gait training;Patient/family education;Manual techniques;Dry needling;Passive range of motion;Taping;Spinal Manipulations;Joint Manipulations    PT Next Visit Plan  Assess HEP and progress PRN, focus on improving posterior pelvic tilt to reduce lumbar lordosis, core strengthening    PT Home Exercise Plan  [email or web address]: seated posterior pelvic tilt, supine pelvic tilt with feet elevated on swiss ball, supine hip flexor stretch edge of bed, sidelying clamshell    Consulted and Agree with Plan of Care  Patient       Patient will benefit from skilled therapeutic intervention in order to improve the following deficits and impairments:  Decreased strength, Pain, Decreased activity tolerance, Impaired flexibility, Decreased range of motion  Visit Diagnosis: Chronic bilateral low back pain, unspecified whether sciatica present  Muscle weakness (generalized)  Other abnormalities of gait and mobility     Problem List Patient Active Problem List   Diagnosis Date Noted  . Back pain 04/28/2020  . OAB (overactive bladder) 04/28/2020  . Skin lesion of scalp 01/11/2018  . Pedal edema 07/11/2017  . Esophageal reflux 07/28/2015  . Generalized abdominal pain 07/28/2015  . Menorrhagia 05/04/2015  . Allergic rhinitis 09/05/2014  . Hyperlipidemia, mixed 08/24/2014  . Absence of bladder continence 08/24/2014  . Hyponatremia 08/24/2014  . Insomnia 04/02/2014  . Amenorrhea 02/23/2014  . Ovarian cyst 08/17/2012  . Cervical cancer screening 03/02/2012  . Allergic state 03/02/2012  . Sinusitis 01/08/2012  . Preventative health care 12/11/2011  . HTN (hypertension) 12/09/2011  . Obese 12/09/2011  . Heartburn     Rosana Hoes, PT, DPT, LAT, ATC 05/06/20  3:28 PM Phone: [phone] Fax: [phone]   [facility] [address] [place], [place], [address] Phone: [phone]   Fax:  [phone]  Name: KIRSTYN LEAN  MRN:  [id number] Date of Birth: 04-04-1976

## 2020-05-06 NOTE — Patient Instructions (Signed)
Access Code: [email or web address] URL: [email or web address] Date: 05/06/2020 Prepared by: Rosana Hoes  Exercises Seated Pelvic Tilt - 2-3 x daily - 7 x weekly - 10 reps - 3-5 seconds hold Posterior Pelvic Tilt with Feet Elevated - 2 x daily - 7 x weekly - 10 reps - 3-5 seconds hold Modified Thomas Stretch - 2-3 x daily - 7 x weekly - 3 reps - 30 seconds hold Clamshell - 2 x daily - 7 x weekly - 20 reps

## 2020-05-09 ENCOUNTER — Encounter: Payer: Self-pay | Admitting: Family Medicine

## 2020-05-13 ENCOUNTER — Other Ambulatory Visit: Payer: Self-pay

## 2020-05-13 ENCOUNTER — Other Ambulatory Visit: Payer: Self-pay | Admitting: *Deleted

## 2020-05-13 ENCOUNTER — Encounter: Payer: Self-pay | Admitting: Family Medicine

## 2020-05-13 ENCOUNTER — Ambulatory Visit (INDEPENDENT_AMBULATORY_CARE_PROVIDER_SITE_OTHER): Payer: 59 | Admitting: Family Medicine

## 2020-05-13 VITALS — BP 130/94 | HR 73 | Ht 68.0 in | Wt 389.6 lb

## 2020-05-13 DIAGNOSIS — G8929 Other chronic pain: Secondary | ICD-10-CM | POA: Diagnosis not present

## 2020-05-13 DIAGNOSIS — M545 Low back pain, unspecified: Secondary | ICD-10-CM

## 2020-05-13 DIAGNOSIS — R600 Localized edema: Secondary | ICD-10-CM

## 2020-05-13 MED ORDER — FUROSEMIDE 20 MG PO TABS: 40.0000 mg | ORAL_TABLET | Freq: Two times a day (BID) | ORAL | 1 refills | Status: DC | PRN

## 2020-05-13 NOTE — Progress Notes (Signed)
I, Christina Mendez, LAT, ATC, am serving as scribe for Dr. Lynne Leader.  Christina Mendez is a 44 y.o. female who presents to [place] at [facility] today for f/u of LBP and L leg pain radiating into the L lateral calf.  She was last seen by Dr. Georgina Snell on 04/15/20 and was referred to outpatient PT and for an L-spine MRI.  She has completed 1 PT session.  Since her last visit, pt reports that her low back pain con't but states that her L leg pain is not currently bothering her.  She reports intermittent R ant hip/groin pain when she stands after sitting for a prolonged period of time.  She denies any numbness/tingling or weakness into her B LEs. She is also started the process of scheduling with weight loss clinic for weight loss.  Diagnostic testing: L-spine MRI- 05/03/20; L-spine XR- 03/10/20   Pertinent review of systems: No fevers or chills  Relevant historical information: Hypertension, morbid obesity   Exam:  BP (!) 130/94 (BP Location: Left Arm, Patient Position: Sitting, Cuff Size: Large)   Pulse 73   Ht 5\' 8"  (1.727 m)   Wt (!) 389 lb 9.6 oz (176.7 kg)   SpO2 97%   BMI 59.24 kg/m  General: Well Developed, well nourished, and in no acute distress.   MSK: L-spine normal-appearing nontender.  Decreased lumbar motion. Lower extremity strength is intact.    Lab and Radiology Results EXAM: MRI LUMBAR SPINE WITHOUT CONTRAST  TECHNIQUE: Multiplanar, multisequence MR imaging of the lumbar spine was performed. No intravenous contrast was administered.  COMPARISON:  Prior MRI from 06/08/2010.  FINDINGS: Segmentation: Standard. Lowest well-formed disc space labeled the L5-S1 level.  Alignment: Physiologic with preservation of the normal lumbar lordosis. No listhesis.  Vertebrae: Vertebral body height maintained without evidence for acute or chronic fracture. Bone marrow signal intensity within normal limits. No discrete or worrisome osseous lesions.  Discogenic reactive endplate changes present about the L4-5 and L5-S1 interspaces, with associated marrow edema at L4-5. No other abnormal marrow edema.  Conus medullaris and cauda equina: Conus extends to the L1 level. Conus and cauda equina appear normal.  Paraspinal and other soft tissues: Paraspinous soft tissues within normal limits. Visualized visceral structures are normal.  Disc levels:  T11-12: Seen only on sagittal projection. Diffuse disc bulge with disc desiccation. No significant stenosis.  T12-L1: Unremarkable.  L1-2:  Unremarkable.  L2-3: Diffuse disc bulge with disc desiccation. Mild facet hypertrophy. Mild prominence of the dorsal epidural fat. No significant spinal stenosis. Mild bilateral L2 foraminal narrowing.  L3-4: Diffuse disc bulge with disc desiccation. Superimposed small right subarticular disc protrusion with annular fissure. Protruding disc mildly indents the right ventral thecal sac without frank neural impingement. Mild facet hypertrophy with prominence of the dorsal epidural fat. Resultant mild spinal stenosis. Mild to moderate bilateral L3 foraminal narrowing.  L4-5: Chronic intervertebral disc space narrowing with diffuse disc bulge and disc desiccation. Discogenic reactive endplate changes with associated marginal endplate spurring and reactive marrow edema. Moderate right worse than left facet hypertrophy. Epidural lipomatosis. Resultant moderate to severe spinal stenosis, largely based on lipomatosis. Moderate bilateral L4 foraminal narrowing.  L5-S1: Chronic intervertebral disc spacing with diffuse disc bulge and disc desiccation. Reactive endplate changes with marginal endplate osteophytic spurring. Resultant broad based posterior disc osteophyte contacts the descending S1 nerve roots bilaterally as they course through the lateral recesses, greater on the left. Superimposed epidural lipomatosis. Mild bilateral facet  hypertrophy. Thecal sac remains  patent. Moderate bilateral L5 foraminal stenosis.  IMPRESSION: 1. 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 2. Broad-based posterior disc osteophyte at L5-S1, contacting and potentially irritating either of the descending S1 nerve roots as they course through the lateral recesses, greater on the left. 3. Disc bulge with facet hypertrophy at L3-4 with resultant mild to moderate bilateral L3 foraminal stenosis. 4. Discogenic reactive endplate changes within the lower lumbar spine with reactive marrow edema at L4-5. Finding could contribute to lower back pain.   Electronically Signed   By: Rise Mu M.D.   On: 05/03/2020 18:50 I, [name], personally (independently) visualized and performed the interpretation of the images attached in this note.     Assessment and Plan: 44 y.o. female with low back pain.  Patient has significant abnormalities on MRI including neuroforaminal stenosis.  Fortunately she does not have significant radicular symptoms at this time.  These have improved with a bit of time and very limited physical therapy.  I am optimistic that with continued physical therapy her back pain is likely going to continue to improve.  However we discussed the next steps.  We will proceed with facet injections if needed.  After review of MRI and symptoms with patient bilateral L4-L5 facets would be the next target.  Patient will let me know if she would like to proceed with that and I am happy to order those without seeing her again.  However could proceed with other targets if needed.  Additionally if she starts developing radicular symptoms certainly could proceed with epidural steroid injection. Recheck in about 6 weeks or so especially if not better.  Distally weight loss should help quite a bit as well.   Discussed warning signs or  symptoms. Please see discharge instructions. Patient expresses understanding.   The above documentation has been reviewed and is accurate and complete [name], M.D.   Total encounter time 30 minutes including charting time date of service. MRI findings next steps treatment plan and facet injections.

## 2020-05-13 NOTE — Patient Instructions (Signed)
Thank you for coming in today. Plan to continue PT and weight loss.  If needed in the future could do back injection.  For mid low back pain on both sides that you currently have would target bilateral L4-L5 facet joints.  Keep me updated.  Could order the injections with a phone call or mychart message.    Facet Joint Block The facet joints connect the bones of the spine (vertebrae). They make it possible for you to bend, twist, and make other movements with your spine. They also keep you from bending too far, twisting too far, and making other extreme movements. A facet joint block is a procedure in which a numbing medicine (anesthetic) is injected into a facet joint. In many cases, an anti-inflammatory medicine (steroid) is also injected. A facet joint block may be done:  To diagnose neck or back pain. If the pain gets better after a facet joint block, it means the pain is probably coming from the facet joint. If the pain does not get better, it means the pain is probably not coming from the facet joint.  To relieve neck or back pain that is caused by an inflamed facet joint. A facet joint block is only done to relieve pain if the pain does not improve with other methods, such as medicine, exercise programs, and physical therapy. Tell a health care provider about:  Any allergies you have.  All medicines you are taking, including vitamins, herbs, eye drops, creams, and over-the-counter medicines.  Any problems you or family members have had with anesthetic medicines.  Any blood disorders you have.  Any surgeries you have had.  Any medical conditions you have or have had.  Whether you are pregnant or may be pregnant. What are the risks? Generally, this is a safe procedure. However, problems may occur, including:  Bleeding.  Injury to a nerve near the injection site.  Pain at the injection site.  Weakness or numbness in areas controlled by nerves near the injection  site.  Infection.  Temporary fluid retention.  Allergic reactions to medicines or dyes.  Injury to other structures or organs near the injection site. What happens before the procedure? Medicines Ask your health care provider about:  Changing or stopping your regular medicines. This is especially important if you are taking diabetes medicines or blood thinners.  Taking medicines such as aspirin and ibuprofen. These medicines can thin your blood. Do not take these medicines unless your health care provider tells you to take them.  Taking over-the-counter medicines, vitamins, herbs, and supplements. Eating and drinking Follow instructions from your health care provider about eating and drinking, which may include:  8 hours before the procedure - stop eating heavy meals or foods, such as meat, fried foods, or fatty foods.  6 hours before the procedure - stop eating light meals or foods, such as toast or cereal.  6 hours before the procedure - stop drinking milk or drinks that contain milk.  2 hours before the procedure - stop drinking clear liquids. Staying hydrated Follow instructions from your health care provider about hydration, which may include:  Up to 2 hours before the procedure - you may continue to drink clear liquids, such as water, clear fruit juice, black coffee, and plain tea. General instructions  Do not use any products that contain nicotine or tobacco for at least 4-6 weeks before the procedure. These products include cigarettes, e-cigarettes, and chewing tobacco. If you need help quitting, ask your health care provider.  Plan to have someone take you home from the hospital or clinic.  Ask your health care provider: ? How your surgery site will be marked. ? What steps will be taken to help prevent infection. These may include:  Removing hair at the surgery site.  Washing skin with a germ-killing soap.  Receiving antibiotic medicine. What happens during the  procedure?   You will put on a hospital gown.  You will lie on your stomach on an X-ray table. You may be asked to lie in a different position if an injection will be made in your neck.  Machines will be used to monitor your oxygen levels, heart rate, and blood pressure.  Your skin will be cleaned.  If an injection will be made in your neck, an IV will be inserted into one of your veins. Fluids and medicine will flow directly into your body through the IV.  A numbing medicine (local anesthetic) will be applied to your skin. Your skin may sting or burn for a moment.  A video X-ray machine (fluoroscopy) will be used to find the joint. In some cases, a CT scan may be used.  A contrast dye may be injected into the facet joint area to help find the joint.  When the joint is located, an anesthetic will be injected into the joint through the needle.  Your health care provider will ask you whether you feel pain relief. ? If you feel relief, a steroid may be injected to provide pain relief for a longer period of time. ? If you do not feel relief or feel only partial relief, additional injections of an anesthetic may be made in other facet joints.  The needle will be removed.  Your skin will be cleaned.  A bandage (dressing) will be applied over each injection site. The procedure may vary among health care providers and hospitals. What happens after the procedure?  Your blood pressure, heart rate, breathing rate, and blood oxygen level will be monitored until you leave the hospital or clinic.  You will lie down and rest for a period of time. Summary  A facet joint block is a procedure in which a numbing medicine (anesthetic) is injected into a facet joint. An anti-inflammatory medicine (stereoid) may also be injected.  Follow instructions from your health care provider about medicines and eating and drinking before the procedure.  Do not use any products that contain nicotine or  tobacco for at least 4-6 weeks before the procedure.  You will lie on your stomach for the procedure, but you may be asked to lie in a different position if an injection will be made in your neck.  When the joint is located, an anesthetic will be injected into the joint through the needle. This information is not intended to replace advice given to you by your health care provider. Make sure you discuss any questions you have with your health care provider. Document Revised: 03/21/2019 Document Reviewed: 11/02/2018 Elsevier Patient Education  [place].

## 2020-05-18 ENCOUNTER — Other Ambulatory Visit: Payer: Self-pay | Admitting: Family Medicine

## 2020-05-20 ENCOUNTER — Ambulatory Visit: Payer: 59 | Admitting: Physical Therapy

## 2020-05-26 ENCOUNTER — Ambulatory Visit: Payer: 59 | Attending: Family Medicine | Admitting: Physical Therapy

## 2020-05-26 ENCOUNTER — Telehealth: Payer: Self-pay | Admitting: Physical Therapy

## 2020-05-26 DIAGNOSIS — G8929 Other chronic pain: Secondary | ICD-10-CM | POA: Insufficient documentation

## 2020-05-26 DIAGNOSIS — M545 Low back pain: Secondary | ICD-10-CM | POA: Insufficient documentation

## 2020-05-26 DIAGNOSIS — M6281 Muscle weakness (generalized): Secondary | ICD-10-CM | POA: Insufficient documentation

## 2020-05-26 DIAGNOSIS — R2689 Other abnormalities of gait and mobility: Secondary | ICD-10-CM | POA: Insufficient documentation

## 2020-05-26 NOTE — Telephone Encounter (Signed)
Patient contacted due to missing PT appointment today. Patient stated she thought the appointment was scheduled for tomorrow morning. She was rescheduled to Friday, 05/29/2020, at 8:30 am. She was reminded of the attendance policy and patient expressed understanding.

## 2020-05-29 ENCOUNTER — Ambulatory Visit: Payer: 59 | Admitting: Physical Therapy

## 2020-05-29 ENCOUNTER — Encounter: Payer: Self-pay | Admitting: Physical Therapy

## 2020-05-29 ENCOUNTER — Other Ambulatory Visit: Payer: Self-pay

## 2020-05-29 DIAGNOSIS — M6281 Muscle weakness (generalized): Secondary | ICD-10-CM | POA: Diagnosis present

## 2020-05-29 DIAGNOSIS — R2689 Other abnormalities of gait and mobility: Secondary | ICD-10-CM | POA: Diagnosis present

## 2020-05-29 DIAGNOSIS — M545 Low back pain: Secondary | ICD-10-CM | POA: Diagnosis not present

## 2020-05-29 DIAGNOSIS — G8929 Other chronic pain: Secondary | ICD-10-CM

## 2020-05-29 NOTE — Therapy (Signed)
[facility] [address] [place], [place], [address] Phone: [phone]   Fax:  [phone]  Physical Therapy Treatment  Patient Details  Name: Christina Mendez MRN: [id number] Date of Birth: 29-Oct-1976 Referring Provider (PT): Rodolph Bong, MD   Encounter Date: 05/29/2020   PT End of Session - 05/29/20 0832    Visit Number 2    Number of Visits 8    Date for PT Re-Evaluation 07/01/20    Authorization Type [employer]    PT Start Time [phone]    PT Stop Time 0912    PT Time Calculation (min) 40 min    Activity Tolerance Patient tolerated treatment well    Behavior During Therapy [facility] for tasks assessed/performed           Past Medical History:  Diagnosis Date  . Allergic state 03/02/2012  . Allergy last couple of years   seasonal  . Cervical cancer screening 03/02/2012  . Chicken pox 3rd grade  . GERD (gastroesophageal reflux disease)   . Heartburn   . HTN (hypertension) 12/09/2011  . Hyperlipidemia   . Hypertension   . Inflamed skin tag 03/02/2012  . Menorrhagia 05/04/2015  . Obese 12/09/2011  . OSA (obstructive sleep apnea)   . Ovarian cyst 08/17/2012  . Pedal edema 07/11/2017  . Sinusitis 01/08/2012    Past Surgical History:  Procedure Laterality Date  . cyst removed  44 yr old   benign, abdominal    There were no vitals filed for this visit.   Subjective Assessment - 05/29/20 0834    Subjective Patient reports things have been going pretty good, she has been consistent with exercises. She has doing some exercises in the pool which has helped.    Patient Stated Goals Improve back pain and mobility    Pain Score 0-No pain    Pain Location Back    Pain Orientation Lower    Pain Descriptors / Indicators Aching;Tightness    Pain Type Chronic pain    Pain Onset More than a month ago    Pain Frequency Intermittent                             [facility] Adult PT Treatment/Exercise - 05/29/20  0001      Exercises   Exercises Lumbar      Lumbar Exercises: Stretches   Single Knee to Chest Stretch 2 reps;30 seconds    Single Knee to Chest Stretch Limitations supine with manual assist    Lower Trunk Rotation 5 reps;10 seconds    Hip Flexor Stretch 2 reps;30 seconds    Hip Flexor Stretch Limitations supine edge of table    Piriformis Stretch 2 reps;30 seconds    Piriformis Stretch Limitations supine with manual assist      Lumbar Exercises: Aerobic   Nustep L5 x 5 min (LE only)      Lumbar Exercises: Supine   Pelvic Tilt 10 reps;5 seconds    Pelvic Tilt Limitations posterior pelvic tilt with feet elevated on swiss ball with hamstring activation    Bridge with Ball Squeeze 10 reps;3 seconds    Basic Lumbar Stabilization Limitations 90-90 alternating heel taps 2x5                  PT Education - 05/29/20 0832    Education Details HEP    Person(s) Educated Patient    Methods Explanation;Demonstration;Verbal cues  Comprehension Verbalized understanding;Returned demonstration;Verbal cues required;Need further instruction            PT Short Term Goals - 05/06/20 1444      PT SHORT TERM GOAL #1   Title Patient will be I with initial HEP to maintain progress with PT    Time 4    Period Weeks    Status New    Target Date 06/03/20      PT SHORT TERM GOAL #2   Title Patient will be able to walk and stand >/= 10 minutes with needing seated break    Time 4    Period Weeks    Status New    Target Date 06/03/20             PT Long Term Goals - 05/06/20 1445      PT LONG TERM GOAL #1   Title Patient will be I with final HEP to maintain progress from PT    Time 8    Period Weeks    Status New    Target Date 07/01/20      PT LONG TERM GOAL #2   Title Patient will exhibit improved core and hip strength to >/= 4/5 MMT to improve postural control and reduce pain with walking    Time 8    Period Weeks    Status New    Target Date 07/01/20      PT  LONG TERM GOAL #3   Title Patient will be able to stand and walk >/= 30 minutes without needing seated break    Time 8    Period Weeks    Status New    Target Date 07/01/20      PT LONG TERM GOAL #4   Title Patient will be able to perform all dressing and self care with no difficulty    Time 8    Period Weeks    Status New    Target Date 07/01/20      PT LONG TERM GOAL #5   Title Patient will report improved functional ability of </= 45% limitation on FOTO    Time 8    Period Weeks    Status New    Target Date 07/01/20                 Plan - 05/29/20 4081    Clinical Impression Statement Patient tolerated therapy well with no adverse effects. Continued working on core activation to promote posterior pelvic tilt and progressed strengthening this visit with good tolerance. She did not report any increase in low back pain or tightness with therapy. HEP was progressed this visit. She would benefit from continued skilled PT to reduce lower back pain with standing and walking by improving core and gluteal control and reducing lower back tightness.    PT Treatment/Interventions ADLs/Self Care Home Management;Cryotherapy;Electrical Stimulation;Moist Heat;Traction;Neuromuscular re-education;Therapeutic exercise;Therapeutic activities;Functional mobility training;Gait training;Patient/family education;Manual techniques;Dry needling;Passive range of motion;Taping;Spinal Manipulations;Joint Manipulations    PT Next Visit Plan Assess HEP and progress PRN, focus on improving posterior pelvic tilt to reduce lumbar lordosis, core strengthening    PT Home Exercise Plan [email or web address]: seated posterior pelvic tilt, supine pelvic tilt with feet elevated on swiss ball, LTR, supine hip flexor stretch edge of bed, sidelying clamshell, bridge with ball squeeze, 90-90 alternating toe taps    Consulted and Agree with Plan of Care Patient           Patient will benefit from skilled therapeutic intervention  in order to improve the following deficits and impairments:  Decreased strength, Pain, Decreased activity tolerance, Impaired flexibility, Decreased range of motion  Visit Diagnosis: Chronic bilateral low back pain, unspecified whether sciatica present  Muscle weakness (generalized)  Other abnormalities of gait and mobility     Problem List Patient Active Problem List   Diagnosis Date Noted  . Back pain 04/28/2020  . OAB (overactive bladder) 04/28/2020  . Skin lesion of scalp 01/11/2018  . Pedal edema 07/11/2017  . Esophageal reflux 07/28/2015  . Generalized abdominal pain 07/28/2015  . Menorrhagia 05/04/2015  . Allergic rhinitis 09/05/2014  . Hyperlipidemia, mixed 08/24/2014  . Absence of bladder continence 08/24/2014  . Hyponatremia 08/24/2014  . Insomnia 04/02/2014  . Amenorrhea 02/23/2014  . Ovarian cyst 08/17/2012  . Cervical cancer screening 03/02/2012  . Allergic state 03/02/2012  . Sinusitis 01/08/2012  . Preventative health care 12/11/2011  . HTN (hypertension) 12/09/2011  . Obese 12/09/2011  . Heartburn     Rosana Hoes, PT, DPT, LAT, ATC 05/29/20  9:28 AM Phone: [phone] Fax: [phone]   [facility] [address] [place], [place], [address] Phone: [phone]   Fax:  [phone]  Name: Christina Mendez MRN: [id number] Date of Birth: 1976-08-10

## 2020-05-29 NOTE — Patient Instructions (Addendum)
Access Code: [email or web address] URL: [email or web address] Date: 05/29/2020 Prepared by: Rosana Hoes  Exercises Seated Pelvic Tilt - 3-4 x daily - 7 x weekly - 10 reps - 3-5 seconds hold Posterior Pelvic Tilt with Feet Elevated - 2 x daily - 7 x weekly - 10 reps - 3-5 seconds hold Supine Lower Trunk Rotation - 2 x daily - 7 x weekly - 5 reps - 10 seconds hold Modified Thomas Stretch - 2 x daily - 7 x weekly - 3 reps - 30 seconds hold Clamshell - 1 x daily - 7 x weekly - 20 reps - 2 sets Supine Bridge with Mini Swiss Ball Between Knees - 1 x daily - 7 x weekly - 2 sets - 10 reps - 2-3 seconds hold Supine 90/90 Alternating Toe Touch One Leg at a Time - 1 x daily - 7 x weekly - 2 sets - 10 reps

## 2020-06-02 ENCOUNTER — Other Ambulatory Visit: Payer: Self-pay

## 2020-06-02 ENCOUNTER — Encounter: Payer: Self-pay | Admitting: Physical Therapy

## 2020-06-02 ENCOUNTER — Ambulatory Visit: Payer: 59 | Admitting: Physical Therapy

## 2020-06-02 DIAGNOSIS — M545 Low back pain, unspecified: Secondary | ICD-10-CM

## 2020-06-02 DIAGNOSIS — R2689 Other abnormalities of gait and mobility: Secondary | ICD-10-CM

## 2020-06-02 DIAGNOSIS — M6281 Muscle weakness (generalized): Secondary | ICD-10-CM

## 2020-06-02 NOTE — Therapy (Signed)
[facility] [address] [place], [place], [address] Phone: [phone]   Fax:  [phone]  Physical Therapy Treatment  Patient Details  Name: Christina Mendez MRN: [id number] Date of Birth: 31-Aug-1976 Referring Provider (PT): Rodolph Bong, MD   Encounter Date: 06/02/2020   PT End of Session - 06/02/20 0851    Visit Number 3    Number of Visits 8    Date for PT Re-Evaluation 07/01/20    Authorization Type [employer]    PT Start Time [phone]    PT Stop Time 0915    PT Time Calculation (min) 39 min    Activity Tolerance Patient tolerated treatment well    Behavior During Therapy [employer] for tasks assessed/performed           Past Medical History:  Diagnosis Date  . Allergic state 03/02/2012  . Allergy last couple of years   seasonal  . Cervical cancer screening 03/02/2012  . Chicken pox 3rd grade  . GERD (gastroesophageal reflux disease)   . Heartburn   . HTN (hypertension) 12/09/2011  . Hyperlipidemia   . Hypertension   . Inflamed skin tag 03/02/2012  . Menorrhagia 05/04/2015  . Obese 12/09/2011  . OSA (obstructive sleep apnea)   . Ovarian cyst 08/17/2012  . Pedal edema 07/11/2017  . Sinusitis 01/08/2012    Past Surgical History:  Procedure Laterality Date  . cyst removed  44 yr old   benign, abdominal    There were no vitals filed for this visit.   Subjective Assessment - 06/02/20 0850    Subjective Patient reports the new exercises from last visit help her back feel looser.    Patient Stated Goals Improve back pain and mobility    Currently in Pain? No/denies              [facility] PT Assessment - 06/02/20 0001      AROM   Overall AROM Comments Patient continues to remain in neutral/extended lumbar position with forward reach to toes                         [facility] Adult PT Treatment/Exercise - 06/02/20 0001      Exercises   Exercises Lumbar      Lumbar Exercises: Stretches   Single  Knee to Chest Stretch 2 reps;30 seconds    Single Knee to Chest Stretch Limitations supine with manual assist    Lower Trunk Rotation 5 reps;10 seconds    Hip Flexor Stretch 2 reps;30 seconds    Hip Flexor Stretch Limitations supine edge of table    Piriformis Stretch 2 reps;30 seconds    Piriformis Stretch Limitations supine with manual assist      Lumbar Exercises: Aerobic   Nustep L5 x 5 min (LE only)      Lumbar Exercises: Seated   Sit to Stand 10 reps   2 sets   Sit to Stand Limitations standard chair, cue for proper hip hinge technique and control      Lumbar Exercises: Supine   Bridge with Ball Squeeze 10 reps;3 seconds    Basic Lumbar Stabilization Limitations 90-90 alternating heel taps 3x10                  PT Education - 06/02/20 0851    Education Details HEP    Person(s) Educated Patient    Methods Explanation;Demonstration;Verbal cues    Comprehension Verbalized understanding;Returned demonstration;Verbal cues required;Need further  instruction            PT Short Term Goals - 06/02/20 0907      PT SHORT TERM GOAL #1   Title Patient will be I with initial HEP to maintain progress with PT    Time 4    Period Weeks    Status On-going    Target Date 06/03/20      PT SHORT TERM GOAL #2   Title Patient will be able to walk and stand >/= 10 minutes with needing seated break    Baseline Patient reports 15-20 minutes    Time 4    Period Weeks    Status Achieved    Target Date 06/03/20             PT Long Term Goals - 05/06/20 1445      PT LONG TERM GOAL #1   Title Patient will be I with final HEP to maintain progress from PT    Time 8    Period Weeks    Status New    Target Date 07/01/20      PT LONG TERM GOAL #2   Title Patient will exhibit improved core and hip strength to >/= 4/5 MMT to improve postural control and reduce pain with walking    Time 8    Period Weeks    Status New    Target Date 07/01/20      PT LONG TERM GOAL #3    Title Patient will be able to stand and walk >/= 30 minutes without needing seated break    Time 8    Period Weeks    Status New    Target Date 07/01/20      PT LONG TERM GOAL #4   Title Patient will be able to perform all dressing and self care with no difficulty    Time 8    Period Weeks    Status New    Target Date 07/01/20      PT LONG TERM GOAL #5   Title Patient will report improved functional ability of </= 45% limitation on FOTO    Time 8    Period Weeks    Status New    Target Date 07/01/20                 Plan - 06/02/20 0851    Clinical Impression Statement Patient tolerated therapy well with no adverse effects. Continued stretching and core/hip strengthening with good tolerance and patient reports improvement in symptoms following therapy. She does require cueing for proper form with exercises and core engagement. No change to HEP this visit. She would benefit from continued skilled PT to reduce lower back pain with standing and walking by improving core and gluteal control and reducing lower back tightness.    PT Treatment/Interventions ADLs/Self Care Home Management;Cryotherapy;Electrical Stimulation;Moist Heat;Traction;Neuromuscular re-education;Therapeutic exercise;Therapeutic activities;Functional mobility training;Gait training;Patient/family education;Manual techniques;Dry needling;Passive range of motion;Taping;Spinal Manipulations;Joint Manipulations    PT Next Visit Plan Assess HEP and progress PRN, hip stretching, core and hip strengthening    PT Home Exercise Plan [email or web address]: seated posterior pelvic tilt, supine pelvic tilt with feet elevated on swiss ball, LTR, supine hip flexor stretch edge of bed, sidelying clamshell, bridge with ball squeeze, 90-90 alternating toe taps    Consulted and Agree with Plan of Care Patient           Patient will benefit from skilled therapeutic intervention in order to improve the following deficits and impairments:  Decreased strength, Pain, Decreased activity tolerance, Impaired flexibility, Decreased range of motion  Visit Diagnosis: Chronic bilateral low back pain, unspecified whether sciatica present  Muscle weakness (generalized)  Other abnormalities of gait and mobility     Problem List Patient Active Problem List   Diagnosis Date Noted  . Back pain 04/28/2020  . OAB (overactive bladder) 04/28/2020  . Skin lesion of scalp 01/11/2018  . Pedal edema 07/11/2017  . Esophageal reflux 07/28/2015  . Generalized abdominal pain 07/28/2015  . Menorrhagia 05/04/2015  . Allergic rhinitis 09/05/2014  . Hyperlipidemia, mixed 08/24/2014  . Absence of bladder continence 08/24/2014  . Hyponatremia 08/24/2014  . Insomnia 04/02/2014  . Amenorrhea 02/23/2014  . Ovarian cyst 08/17/2012  . Cervical cancer screening 03/02/2012  . Allergic state 03/02/2012  . Sinusitis 01/08/2012  . Preventative health care 12/11/2011  . HTN (hypertension) 12/09/2011  . Obese 12/09/2011  . Heartburn     Hilda Blades, PT, DPT, LAT, ATC 06/02/20  9:19 AM Phone: [phone] Fax: [facility] [address] [place], [place], [address] Phone: [phone]   Fax:  [phone]  Name: Christina Mendez MRN: [id number] Date of Birth: 1976/01/26

## 2020-06-10 ENCOUNTER — Encounter (INDEPENDENT_AMBULATORY_CARE_PROVIDER_SITE_OTHER): Payer: Self-pay | Admitting: Bariatrics

## 2020-06-10 ENCOUNTER — Other Ambulatory Visit: Payer: Self-pay

## 2020-06-10 ENCOUNTER — Ambulatory Visit (INDEPENDENT_AMBULATORY_CARE_PROVIDER_SITE_OTHER): Payer: 59 | Admitting: Bariatrics

## 2020-06-10 VITALS — BP 115/72 | HR 68 | Temp 98.2°F | Ht 67.0 in | Wt 376.0 lb

## 2020-06-10 DIAGNOSIS — E782 Mixed hyperlipidemia: Secondary | ICD-10-CM | POA: Diagnosis not present

## 2020-06-10 DIAGNOSIS — I10 Essential (primary) hypertension: Secondary | ICD-10-CM | POA: Diagnosis not present

## 2020-06-10 DIAGNOSIS — F3289 Other specified depressive episodes: Secondary | ICD-10-CM | POA: Diagnosis not present

## 2020-06-10 DIAGNOSIS — Z6841 Body Mass Index (BMI) 40.0 and over, adult: Secondary | ICD-10-CM

## 2020-06-10 DIAGNOSIS — Z9189 Other specified personal risk factors, not elsewhere classified: Secondary | ICD-10-CM

## 2020-06-10 DIAGNOSIS — Z1331 Encounter for screening for depression: Secondary | ICD-10-CM

## 2020-06-10 DIAGNOSIS — Z0289 Encounter for other administrative examinations: Secondary | ICD-10-CM

## 2020-06-10 DIAGNOSIS — R0602 Shortness of breath: Secondary | ICD-10-CM | POA: Diagnosis not present

## 2020-06-10 DIAGNOSIS — R5383 Other fatigue: Secondary | ICD-10-CM

## 2020-06-10 DIAGNOSIS — K219 Gastro-esophageal reflux disease without esophagitis: Secondary | ICD-10-CM

## 2020-06-10 DIAGNOSIS — M549 Dorsalgia, unspecified: Secondary | ICD-10-CM

## 2020-06-10 DIAGNOSIS — E559 Vitamin D deficiency, unspecified: Secondary | ICD-10-CM

## 2020-06-10 DIAGNOSIS — G4739 Other sleep apnea: Secondary | ICD-10-CM

## 2020-06-10 NOTE — Progress Notes (Signed)
Dear Dr. Danise Mendez,   Thank you for referring Christina Mendez to our clinic. The following note includes my evaluation and treatment recommendations.  Chief Complaint:   OBESITY Christina Mendez (MR# [id number]) is a 44 y.o. female who presents for evaluation and treatment of obesity and related comorbidities. Current BMI is Body mass index is 58.89 kg/m.Marland Kitchen Christina Mendez has been struggling with her weight for many years and has been unsuccessful in either losing weight, maintaining weight loss, or reaching her healthy weight goal.  Christina Mendez is currently in the action stage of change and ready to dedicate time achieving and maintaining a healthier weight. Christina Mendez is interested in becoming our patient and working on intensive lifestyle modifications including (but not limited to) diet and exercise for weight loss.  Christina Mendez eats outside the home almost daily. She does like to cook. She skips meals, mainly breakfast.  Christina Mendez's habits were reviewed today and are as follows: Her family rarely eats meals together, her desired weight loss is 176 lbs, she started gaining weight in her late 20's, her heaviest weight ever was 398 pounds, she craves starches, sugar, pasta, and fried foods, she snacks frequently in the evenings, she skips breakfast frequently, she is frequently drinking liquids with calories, she frequently makes poor food choices, she frequently eats larger portions than normal and she struggles with emotional eating.  Depression Screen Christina Mendez's Food and Mood (modified PHQ-9) score was 20.  Depression screen PHQ 2/9 06/10/2020  Decreased Interest 3  Down, Depressed, Hopeless 1  PHQ - 2 Score 4  Altered sleeping 3  Tired, decreased energy 3  Change in appetite 3  Feeling bad or failure about yourself  3  Trouble concentrating 1  Moving slowly or fidgety/restless 3  Suicidal thoughts 0  PHQ-9 Score 20  Difficult doing work/chores Not difficult at all   Subjective:     Other fatigue. Christina Mendez denies daytime somnolence and admits to waking up still tired. Christina Mendez generally gets 5-6 hours of sleep per night, and states that she does not sleep well most nights. Snoring is not present with CPAP. Apneic episodes are not present with CPAP. Epworth Sleepiness Score is 1.  SOB (shortness of breath) on exertion. Christina Mendez notes increasing shortness of breath with certain activities and seems to be worsening over time with weight gain. She notes getting out of breath sooner with activity than she used to. This has gotten worse recently. Christina Mendez denies shortness of breath at rest or orthopnea.  Essential hypertension. Christina Mendez is on losartan and metoprolol. Blood pressure is well controlled.  BP Readings from Last 3 Encounters:  06/10/20 115/72  05/13/20 (!) 130/94  04/28/20 134/85   Lab Results  Component Value Date   CREATININE 0.78 01/27/2020   CREATININE 0.80 07/03/2019   CREATININE 0.77 07/19/2018   Hyperlipidemia, mixed. Christina Mendez takes [place].   Lab Results  Component Value Date   CHOL 189 01/27/2020   HDL 52.00 01/27/2020   LDLCALC 107 (H) 01/27/2020   TRIG 151.0 (H) 01/27/2020   CHOLHDL 4 01/27/2020   Lab Results  Component Value Date   ALT 20 01/27/2020   AST 16 01/27/2020   ALKPHOS 92 01/27/2020   BILITOT 0.4 01/27/2020   The 10-year ASCVD risk score Denman George DC Jr., et al., 2013) is: 1.5%   Values used to calculate the score:     Age: 69 years     Sex: Female     Is Non-Hispanic African American: Yes  Diabetic: No     Tobacco smoker: No     Systolic Blood Pressure: 115 mmHg     Is BP treated: Yes     HDL Cholesterol: 52 mg/dL     Total Cholesterol: 189 mg/dL  Back pain, unspecified back location, unspecified back pain laterality, unspecified chronicity. Christina Mendez has chronic back pain and is going to PT.  Gastroesophageal reflux disease, unspecified whether esophagitis present. Christina Mendez is taking Prilosec and  probiotic.  Other sleep apnea. Christina Mendez wears CPAP.  Vitamin D deficiency. Christina Mendez is on OTC Vitamin D supplementation.   Other depression, emotional eating. Christina Mendez had a strongly positive depression screen with a PHQ-9 score of 20.  At risk for activity intolerance. Christina Mendez is at risk of exercise intolerance due to back pain, obesity, obstructive sleep apnea, and joint pain.  Assessment/Plan:   Other fatigue. Christina Mendez does feel that her weight is causing her energy to be lower than it should be. Fatigue may be related to obesity, depression or many other causes. Labs will be ordered, and in the meanwhile, Christina Mendez will focus on self care including making healthy food choices, increasing physical activity and focusing on stress reduction. EKG 12-Lead, Comprehensive metabolic panel, Hemoglobin A1c, Insulin, random, T3, T4, free, TSH testing ordered today.  SOB (shortness of breath) on exertion. Christina Mendez does feel that she gets out of breath more easily that she used to when she exercises. Christina Mendez's shortness of breath appears to be obesity related and exercise induced. She has agreed to work on weight loss and gradually increase exercise to treat her exercise induced shortness of breath. Will continue to monitor closely.  Essential hypertension. Christina Mendez is working on healthy weight loss and exercise to improve blood pressure control. We will watch for signs of hypotension as she continues her lifestyle modifications. She will continue her medications as directed. Comprehensive metabolic panel, Hemoglobin A1c, Insulin, random labs ordered today.  Hyperlipidemia, mixed. Cardiovascular risk and specific lipid/LDL goals reviewed.  We discussed several lifestyle modifications today and Christina Mendez will continue to work on diet, exercise and weight loss efforts. Orders and follow up as documented in patient record. Will follow over time.  Counseling Intensive lifestyle modifications are the first  line treatment for this issue. . Dietary changes: Increase soluble fiber. Decrease simple carbohydrates. . Exercise changes: Moderate to vigorous-intensity aerobic activity 150 minutes per week if tolerated. . Lipid-lowering medications: see documented in medical record.  Back pain, unspecified back location, unspecified back pain laterality, unspecified chronicity. Evalene will continue PT and will take pain medication as needed.  Gastroesophageal reflux disease, unspecified whether esophagitis present. Intensive lifestyle modifications are the first line treatment for this issue. We discussed several lifestyle modifications today and she will continue to work on diet, exercise and weight loss efforts. Orders and follow up as documented in patient record. Christina Mendez will continue her medications as directed.  Counseling . If a person has gastroesophageal reflux disease (GERD), food and stomach acid move back up into the esophagus and cause symptoms or problems such as damage to the esophagus. . Anti-reflux measures include: raising the head of the bed, avoiding tight clothing or belts, avoiding eating late at night, not lying down shortly after mealtime, and achieving weight loss. . Avoid ASA, NSAID's, caffeine, alcohol, and tobacco.  . OTC Pepcid and/or Tums are often very helpful for as needed use.  Marland Kitchen However, for persisting chronic or daily symptoms, stronger medications like Omeprazole may be needed. . You may need to avoid foods and drinks  such as: ? Coffee and tea (with or without caffeine). ? Drinks that contain alcohol. ? Energy drinks and sports drinks. ? Bubbly (carbonated) drinks or sodas. ? Chocolate and cocoa. ? Peppermint and mint flavorings. ? Garlic and onions. ? Horseradish. ? Spicy and acidic foods. These include peppers, chili powder, curry powder, vinegar, hot sauces, and BBQ sauce. ? Citrus fruit juices and citrus fruits, such as oranges, lemons, and  limes. ? Tomato-based foods. These include red sauce, chili, salsa, and pizza with red sauce. ? Fried and fatty foods. These include donuts, french fries, potato chips, and high-fat dressings. ? High-fat meats. These include hot dogs, rib eye steak, sausage, ham, and bacon.  Other sleep apnea. Intensive lifestyle modifications are the first line treatment for this issue. We discussed several lifestyle modifications today and she will continue to work on diet, exercise and weight loss efforts. We will continue to monitor. Orders and follow up as documented in patient record. Christina Mendez will continue to use CPAP as directed.  Counseling  Sleep apnea is a condition in which breathing pauses or becomes shallow during sleep. This happens over and over during the night. This disrupts your sleep and keeps your body from getting the rest that it needs, which can cause tiredness and lack of energy (fatigue) during the day.  Sleep apnea treatment: If you were given a device to open your airway while you sleep, USE IT!  Sleep hygiene:   Limit or avoid alcohol, caffeinated beverages, and cigarettes, especially close to bedtime.   Do not eat a large meal or eat spicy foods right before bedtime. This can lead to digestive discomfort that can make it hard for you to sleep.  Keep a sleep diary to help you and your health care provider figure out what could be causing your insomnia.  . Make your bedroom a dark, comfortable place where it is easy to fall asleep. ? Put up shades or blackout curtains to block light from outside. ? Use a white noise machine to block noise. ? Keep the temperature cool. . Limit screen use before bedtime. This includes: ? Watching TV. ? Using your smartphone, tablet, or computer. . Stick to a routine that includes going to bed and waking up at the same times every day and night. This can help you fall asleep faster. Consider making a quiet activity, such as reading, part of your  nighttime routine. . Try to avoid taking naps during the day so that you sleep better at night. . Get out of bed if you are still awake after 15 minutes of trying to sleep. Keep the lights down, but try reading or doing a quiet activity. When you feel sleepy, go back to bed.  Vitamin D deficiency. Low Vitamin D level contributes to fatigue and are associated with obesity, breast, and colon cancer. VITAMIN D 25 Hydroxy (Vit-D Deficiency, Fractures) level will be checked today.  Other depression, emotional eating. Christina Mendez had a positive depression screening. Depression is commonly associated with obesity and often results in emotional eating behaviors. We will monitor this closely and work on CBT to help improve the non-hunger eating patterns. Referral to Psychology may be required if no improvement is seen as she continues in our clinic.  At risk for activity intolerance. Christina Mendez was given approximately 15 minutes of exercise intolerance counseling today. She is 44 y.o. female and has risk factors exercise intolerance including obesity. We discussed intensive lifestyle modifications today with an emphasis on specific weight loss instructions  and strategies. Anabeth will slowly increase activity as tolerated.  Repetitive spaced learning was employed today to elicit superior memory formation and behavioral change.  Class 3 severe obesity with serious comorbidity and body mass index (BMI) of 50.0 to 59.9 in adult, unspecified obesity type (HCC).  K[place]ile is currently in the action stage of change and her goal is to continue with weight loss efforts. I recommend Makenzy begin the structured treatment plan as follows:  She has agreed to the Category 4 Plan minus 100 calories.  We independently reviewed with the patient labs including CMP, lipids, CBC, glucose, and TSH.  She will work on meal planning, intentional eating, eating out less frequently, and will avoid sugary drinks.  Exercise goals:  All adults should avoid inactivity. Some physical activity is better than none, and adults who participate in any amount of physical activity gain some health benefits.   Behavioral modification strategies: increasing lean protein intake, decreasing simple carbohydrates, increasing vegetables, increasing water intake, decreasing eating out, no skipping meals, meal planning and cooking strategies, keeping healthy foods in the home and planning for success.  She was informed of the importance of frequent follow-up visits to maximize her success with intensive lifestyle modifications for her multiple health conditions. She was informed we would discuss her lab results at her next visit unless there is a critical issue that needs to be addressed sooner. Christina Mendez agreed to keep her next visit at the agreed upon time to discuss these results.  Objective:   Blood pressure 115/72, pulse 68, temperature 98.2 F (36.8 C), height 5\' 7"  (1.702 m), weight (!) 376 lb (170.6 kg), last menstrual period 06/02/2020, SpO2 98 %. Body mass index is 58.89 kg/m.  EKG: Normal sinus rhythm, rate 69 BPM. Within normal limits.  Indirect Calorimeter completed today shows a VO2 of 324 and a REE of 2253.  Her calculated basal metabolic rate is 06/04/2020 thus her basal metabolic rate is worse than expected.  General: Cooperative, alert, well developed, in no acute distress. HEENT: Conjunctivae and lids unremarkable. Cardiovascular: Regular rhythm.  Lungs: Normal work of breathing. Neurologic: No focal deficits.   Lab Results  Component Value Date   CREATININE 0.78 01/27/2020   BUN 10 01/27/2020   NA 139 01/27/2020   K 3.9 01/27/2020   CL 104 01/27/2020   CO2 29 01/27/2020   Lab Results  Component Value Date   ALT 20 01/27/2020   AST 16 01/27/2020   ALKPHOS 92 01/27/2020   BILITOT 0.4 01/27/2020   No results found for: HGBA1C No results found for: INSULIN Lab Results  Component Value Date   TSH 0.74 01/27/2020    Lab Results  Component Value Date   CHOL 189 01/27/2020   HDL 52.00 01/27/2020   LDLCALC 107 (H) 01/27/2020   TRIG 151.0 (H) 01/27/2020   CHOLHDL 4 01/27/2020   Lab Results  Component Value Date   WBC 5.2 01/27/2020   HGB 13.9 01/27/2020   HCT 41.6 01/27/2020   MCV 89.2 01/27/2020   PLT 268.0 01/27/2020   No results found for: IRON, TIBC, FERRITIN  Attestation Statements:   Reviewed by clinician on day of visit: allergies, medications, problem list, medical history, surgical history, family history, social history, and previous encounter notes.  01/29/2020, am acting as Fernanda Drum for Energy manager, DO   I have reviewed the above documentation for accuracy and completeness, and I agree with the above. [employer], DO

## 2020-06-11 ENCOUNTER — Encounter (INDEPENDENT_AMBULATORY_CARE_PROVIDER_SITE_OTHER): Payer: Self-pay | Admitting: Bariatrics

## 2020-06-11 DIAGNOSIS — R7303 Prediabetes: Secondary | ICD-10-CM | POA: Insufficient documentation

## 2020-06-11 LAB — COMPREHENSIVE METABOLIC PANEL
ALT: 25 IU/L (ref 0–32)
AST: 17 IU/L (ref 0–40)
Albumin/Globulin Ratio: 1.5 (ref 1.2–2.2)
Albumin: 4.2 g/dL (ref 3.8–4.8)
Alkaline Phosphatase: 72 IU/L (ref 48–121)
BUN/Creatinine Ratio: 10 (ref 9–23)
BUN: 9 mg/dL (ref 6–24)
Bilirubin Total: 0.3 mg/dL (ref 0.0–1.2)
CO2: 27 mmol/L (ref 20–29)
Calcium: 9.4 mg/dL (ref 8.7–10.2)
Chloride: 103 mmol/L (ref 96–106)
Creatinine, Ser: 0.89 mg/dL (ref 0.57–1.00)
GFR calc Af Amer: 91 mL/min/{1.73_m2} (ref >59)
GFR calc non Af Amer: 79 mL/min/{1.73_m2} (ref >59)
Globulin, Total: 2.8 g/dL (ref 1.5–4.5)
Glucose: 88 mg/dL (ref 65–99)
Potassium: 4.4 mmol/L (ref 3.5–5.2)
Sodium: 142 mmol/L (ref 134–144)
Total Protein: 7 g/dL (ref 6.0–8.5)

## 2020-06-11 LAB — HEMOGLOBIN A1C
Est. average glucose Bld gHb Est-mCnc: 114 mg/dL
Hgb A1c MFr Bld: 5.6 % (ref 4.8–5.6)

## 2020-06-11 LAB — INSULIN, RANDOM: INSULIN: 35.9 u[IU]/mL — ABNORMAL HIGH (ref 2.6–24.9)

## 2020-06-11 LAB — T4, FREE: Free T4: 1.42 ng/dL (ref 0.82–1.77)

## 2020-06-11 LAB — T3: T3, Total: 121 ng/dL (ref 71–180)

## 2020-06-11 LAB — VITAMIN D 25 HYDROXY (VIT D DEFICIENCY, FRACTURES): Vit D, 25-Hydroxy: 17.6 ng/mL — ABNORMAL LOW (ref 30.0–100.0)

## 2020-06-11 LAB — TSH: TSH: 0.578 u[IU]/mL (ref 0.450–4.500)

## 2020-06-12 ENCOUNTER — Other Ambulatory Visit: Payer: Self-pay

## 2020-06-12 ENCOUNTER — Encounter: Payer: Self-pay | Admitting: Physical Therapy

## 2020-06-12 ENCOUNTER — Ambulatory Visit: Payer: 59 | Attending: Family Medicine | Admitting: Physical Therapy

## 2020-06-12 DIAGNOSIS — M6281 Muscle weakness (generalized): Secondary | ICD-10-CM | POA: Insufficient documentation

## 2020-06-12 DIAGNOSIS — R2689 Other abnormalities of gait and mobility: Secondary | ICD-10-CM | POA: Diagnosis present

## 2020-06-12 DIAGNOSIS — M545 Low back pain, unspecified: Secondary | ICD-10-CM

## 2020-06-12 DIAGNOSIS — G8929 Other chronic pain: Secondary | ICD-10-CM | POA: Insufficient documentation

## 2020-06-12 NOTE — Therapy (Signed)
[facility] [address] [place], [place], [address] Phone: [phone]   Fax:  [phone]  Physical Therapy Treatment  Patient Details  Name: Christina Mendez MRN: [id number] Date of Birth: [birth year] Referring Provider (PT): Rodolph Bong, MD   Encounter Date: 06/12/2020   PT End of Session - 06/12/20 0837    Visit Number 4    Number of Visits 8    Date for PT Re-Evaluation 07/01/20    Authorization Type [employer]    PT Start Time 0830    PT Stop Time 0910    PT Time Calculation (min) 40 min    Activity Tolerance Patient tolerated treatment well    Behavior During Therapy [facility] for tasks assessed/performed           Past Medical History:  Diagnosis Date  . Acid reflux   . Allergic state 03/02/2012  . Allergy last couple of years   seasonal  . Anxiety   . Back pain   . Cervical cancer screening 03/02/2012  . Chest pain   . Chicken pox 3rd grade  . GERD (gastroesophageal reflux disease)   . GERD (gastroesophageal reflux disease)   . Heartburn   . HTN (hypertension) 12/09/2011  . Hyperlipidemia   . Hypertension   . Inflamed skin tag 03/02/2012  . Menorrhagia 05/04/2015  . OAB (overactive bladder)   . Obese 12/09/2011  . Obesity   . OSA (obstructive sleep apnea)   . Ovarian cyst 08/17/2012  . Palpitations   . Pedal edema 07/11/2017  . Sinusitis 01/08/2012  . Swollen ankles   . Swollen feet     Past Surgical History:  Procedure Laterality Date  . cyst removed  44 yr old   benign, abdominal  . HERNIA REPAIR      There were no vitals filed for this visit.   Subjective Assessment - 06/12/20 0835    Subjective Patient reports she is doing well, no new issues. States the exercises are going well. She did feel she had a set back earlier in the week when she did exercises on the floor and had some back pain but the pain went away by the next day.    Patient Stated Goals Improve back pain and mobility     Currently in Pain? No/denies              [facility] PT Assessment - 06/12/20 0001      Strength   Right Hip Flexion 4/5    Right Hip Extension 4/5    Right Hip ABduction 4-/5    Left Hip Flexion 4/5    Left Hip Extension 4/5    Left Hip ABduction 4-/5                         [facility] Adult PT Treatment/Exercise - 06/12/20 0001      Exercises   Exercises Lumbar      Lumbar Exercises: Stretches   Passive Hamstring Stretch 2 reps;30 seconds    Passive Hamstring Stretch Limitations supine with manual assist    Single Knee to Chest Stretch 2 reps;30 seconds    Single Knee to Chest Stretch Limitations supine with manual assist    Lower Trunk Rotation 5 reps;10 seconds    Hip Flexor Stretch 2 reps;30 seconds    Hip Flexor Stretch Limitations supine edge of table    Piriformis Stretch 2 reps;30 seconds    Piriformis Stretch Limitations supine  with manual assist      Lumbar Exercises: Aerobic   Nustep L5 x 5 min (LE only)      Lumbar Exercises: Standing   Lifting 10 reps   2 sets   Lifting Weights (lbs) 30    Lifting Limitations Dead lift from 6" box      Lumbar Exercises: Seated   Sit to Stand 10 reps   2 sets   Sit to Stand Limitations standard chair, cue for proper hip hinge technique and control      Lumbar Exercises: Supine   Bridge with Ball Squeeze 10 reps;3 seconds   2 sets   Basic Lumbar Stabilization Limitations 90-90 alternating heel taps 3x10    Straight Leg Raise 10 reps   2 sets                 PT Education - 06/12/20 0836    Education Details HEP    Person(s) Educated Patient    Methods Explanation;Demonstration;Verbal cues    Comprehension Verbalized understanding;Returned demonstration;Verbal cues required;Need further instruction            PT Short Term Goals - 06/02/20 0907      PT SHORT TERM GOAL #1   Title Patient will be I with initial HEP to maintain progress with PT    Time 4    Period Weeks    Status On-going     Target Date 06/03/20      PT SHORT TERM GOAL #2   Title Patient will be able to walk and stand >/= 10 minutes with needing seated break    Baseline Patient reports 15-20 minutes    Time 4    Period Weeks    Status Achieved    Target Date 06/03/20             PT Long Term Goals - 05/06/20 1445      PT LONG TERM GOAL #1   Title Patient will be I with final HEP to maintain progress from PT    Time 8    Period Weeks    Status New    Target Date 07/01/20      PT LONG TERM GOAL #2   Title Patient will exhibit improved core and hip strength to >/= 4/5 MMT to improve postural control and reduce pain with walking    Time 8    Period Weeks    Status New    Target Date 07/01/20      PT LONG TERM GOAL #3   Title Patient will be able to stand and walk >/= 30 minutes without needing seated break    Time 8    Period Weeks    Status New    Target Date 07/01/20      PT LONG TERM GOAL #4   Title Patient will be able to perform all dressing and self care with no difficulty    Time 8    Period Weeks    Status New    Target Date 07/01/20      PT LONG TERM GOAL #5   Title Patient will report improved functional ability of </= 45% limitation on FOTO    Time 8    Period Weeks    Status New    Target Date 07/01/20                 Plan - 06/12/20 1749    Clinical Impression Statement Patient tolerated therapy well with no adverse  effects. She is progressing well with core and hip strengthening and responds well to stretching which helps to reduce tightness. She did report minor achiness of the low back during therapy but this resolved and she stated she felt better following the exercises. No change to HEP this visit. She would benefit from continued skilled PT to reduce lower back pain with standing and walking by improving core and gluteal control and reducing lower back tightness.    PT Treatment/Interventions ADLs/Self Care Home Management;Cryotherapy;Electrical  Stimulation;Moist Heat;Traction;Neuromuscular re-education;Therapeutic exercise;Therapeutic activities;Functional mobility training;Gait training;Patient/family education;Manual techniques;Dry needling;Passive range of motion;Taping;Spinal Manipulations;Joint Manipulations    PT Next Visit Plan Assess HEP and progress PRN, hip stretching, core and hip strengthening    PT Home Exercise Plan [email or web address]: seated posterior pelvic tilt, supine pelvic tilt with feet elevated on swiss ball, LTR, supine hip flexor stretch edge of bed, sidelying clamshell, bridge with ball squeeze, 90-90 alternating toe taps    Consulted and Agree with Plan of Care Patient           Patient will benefit from skilled therapeutic intervention in order to improve the following deficits and impairments:  Decreased strength, Pain, Decreased activity tolerance, Impaired flexibility, Decreased range of motion  Visit Diagnosis: Chronic bilateral low back pain, unspecified whether sciatica present  Muscle weakness (generalized)  Other abnormalities of gait and mobility     Problem List Patient Active Problem List   Diagnosis Date Noted  . Insulin resistance 06/11/2020  . Back pain 04/28/2020  . OAB (overactive bladder) 04/28/2020  . Skin lesion of scalp 01/11/2018  . Pedal edema 07/11/2017  . Esophageal reflux 07/28/2015  . Generalized abdominal pain 07/28/2015  . Menorrhagia 05/04/2015  . Allergic rhinitis 09/05/2014  . Hyperlipidemia, mixed 08/24/2014  . Absence of bladder continence 08/24/2014  . Hyponatremia 08/24/2014  . Insomnia 04/02/2014  . Amenorrhea 02/23/2014  . Ovarian cyst 08/17/2012  . Cervical cancer screening 03/02/2012  . Allergic state 03/02/2012  . Sinusitis 01/08/2012  . Preventative health care 12/11/2011  . HTN (hypertension) 12/09/2011  . Obese 12/09/2011  . Heartburn     Rosana Hoes, PT, DPT, LAT, ATC 06/12/20  9:10 AM Phone: [phone] Fax: [phone]   [facility] [address] [place], [place], [address] Phone: [phone]   Fax:  [phone]  Name: Christina Mendez MRN: [id number] Date of Birth: [birth year]

## 2020-06-17 ENCOUNTER — Ambulatory Visit: Payer: 59 | Admitting: Physical Therapy

## 2020-06-17 ENCOUNTER — Other Ambulatory Visit: Payer: Self-pay

## 2020-06-17 ENCOUNTER — Encounter: Payer: Self-pay | Admitting: Physical Therapy

## 2020-06-17 DIAGNOSIS — R2689 Other abnormalities of gait and mobility: Secondary | ICD-10-CM

## 2020-06-17 DIAGNOSIS — M545 Low back pain: Secondary | ICD-10-CM | POA: Diagnosis not present

## 2020-06-17 DIAGNOSIS — G8929 Other chronic pain: Secondary | ICD-10-CM

## 2020-06-17 DIAGNOSIS — M6281 Muscle weakness (generalized): Secondary | ICD-10-CM

## 2020-06-17 NOTE — Patient Instructions (Signed)
Access Code: [email or web address] URL: [email or web address] Date: 06/17/2020 Prepared by: Rosana Hoes  Exercises Seated Pelvic Tilt - 3-4 x daily - 7 x weekly - 10 reps - 3-5 seconds hold Posterior Pelvic Tilt with Feet Elevated - 2 x daily - 7 x weekly - 10 reps - 3-5 seconds hold Supine Lower Trunk Rotation - 2 x daily - 7 x weekly - 5 reps - 10 seconds hold Modified Thomas Stretch - 2 x daily - 7 x weekly - 3 reps - 30 seconds hold Clamshell - 1 x daily - 7 x weekly - 20 reps - 2 sets Supine Bridge with Mini Swiss Ball Between Knees - 1 x daily - 7 x weekly - 2 sets - 10 reps - 2-3 seconds hold Supine 90/90 Alternating Toe Touch One Leg at a Time - 1 x daily - 7 x weekly - 2 sets - 10 reps Active Straight Leg Raise with Quad Set - 1 x daily - 7 x weekly - 2 sets - 10 reps Kettlebell Deadlift - 1 x daily - 7 x weekly - 3 sets - 8-10 reps

## 2020-06-17 NOTE — Therapy (Signed)
[facility] [address] [place], [place], [address] Phone: [phone]   Fax:  [phone]  Physical Therapy Treatment  Patient Details  Name: Christina Mendez MRN: [id number] Date of Birth: [birth year] Referring Provider (PT): Rodolph Bong, MD   Encounter Date: 06/17/2020   PT End of Session - 06/17/20 0838    Visit Number 5    Number of Visits 8    Date for PT Re-Evaluation 07/01/20    Authorization Type [employer]    PT Start Time 0830    PT Stop Time 0915    PT Time Calculation (min) 45 min    Activity Tolerance Patient tolerated treatment well    Behavior During Therapy [facility] for tasks assessed/performed           Past Medical History:  Diagnosis Date   Acid reflux    Allergic state 03/02/2012   Allergy last couple of years   seasonal   Anxiety    Back pain    Cervical cancer screening 03/02/2012   Chest pain    Chicken pox 3rd grade   GERD (gastroesophageal reflux disease)    GERD (gastroesophageal reflux disease)    Heartburn    HTN (hypertension) 12/09/2011   Hyperlipidemia    Hypertension    Inflamed skin tag 03/02/2012   Menorrhagia 05/04/2015   OAB (overactive bladder)    Obese 12/09/2011   Obesity    OSA (obstructive sleep apnea)    Ovarian cyst 08/17/2012   Palpitations    Pedal edema 07/11/2017   Sinusitis 01/08/2012   Swollen ankles    Swollen feet     Past Surgical History:  Procedure Laterality Date   cyst removed  44 yr old   benign, abdominal   HERNIA REPAIR      There were no vitals filed for this visit.   Subjective Assessment - 06/17/20 0910    Subjective Patient reports she is doing well. She can tell she is improving because she can stand and do dishes and cook without discomfort, and she is able to walk longer without pain.    Patient Stated Goals Improve back pain and mobility    Currently in Pain? No/denies              [facility] PT Assessment  - 06/17/20 0001      Strength   Right Hip ABduction 4-/5    Left Hip ABduction 4-/5                         [facility] Adult PT Treatment/Exercise - 06/17/20 0001      Exercises   Exercises Lumbar      Lumbar Exercises: Stretches   Passive Hamstring Stretch 2 reps;30 seconds    Passive Hamstring Stretch Limitations supine with manual assist    Lower Trunk Rotation 5 reps;10 seconds    Piriformis Stretch 2 reps;30 seconds    Piriformis Stretch Limitations supine with manual assist      Lumbar Exercises: Aerobic   Nustep L5 x 5 min (LE only)      Lumbar Exercises: Standing   Lifting Weights (lbs) 30# x10, 45# 3x6    Lifting Limitations Dead lift from 6" box      Lumbar Exercises: Supine   Bridge with Ball Squeeze 10 reps;3 seconds   2 sets   Basic Lumbar Stabilization Limitations 90-90 alternating heel taps 3x10    Straight Leg Raise 10 reps  2 sets     Lumbar Exercises: Sidelying   Clam 20 reps   2 sets   Clam Limitations yellow band                  PT Education - 06/17/20 0838    Education Details HEP    Person(s) Educated Patient    Methods Explanation;Demonstration;Verbal cues;Handout    Comprehension Verbalized understanding;Returned demonstration;Verbal cues required;Need further instruction            PT Short Term Goals - 06/02/20 0907      PT SHORT TERM GOAL #1   Title Patient will be I with initial HEP to maintain progress with PT    Time 4    Period Weeks    Status On-going    Target Date 06/03/20      PT SHORT TERM GOAL #2   Title Patient will be able to walk and stand >/= 10 minutes with needing seated break    Baseline Patient reports 15-20 minutes    Time 4    Period Weeks    Status Achieved    Target Date 06/03/20             PT Long Term Goals - 05/06/20 1445      PT LONG TERM GOAL #1   Title Patient will be I with final HEP to maintain progress from PT    Time 8    Period Weeks    Status New    Target Date  07/01/20      PT LONG TERM GOAL #2   Title Patient will exhibit improved core and hip strength to >/= 4/5 MMT to improve postural control and reduce pain with walking    Time 8    Period Weeks    Status New    Target Date 07/01/20      PT LONG TERM GOAL #3   Title Patient will be able to stand and walk >/= 30 minutes without needing seated break    Time 8    Period Weeks    Status New    Target Date 07/01/20      PT LONG TERM GOAL #4   Title Patient will be able to perform all dressing and self care with no difficulty    Time 8    Period Weeks    Status New    Target Date 07/01/20      PT LONG TERM GOAL #5   Title Patient will report improved functional ability of </= 45% limitation on FOTO    Time 8    Period Weeks    Status New    Target Date 07/01/20                 Plan - 06/17/20 0840    Clinical Impression Statement Patient tolerated therapy well with no adverse effects. Progressed strengthening this visit and continued with lifting mechanics with good tolerance. Patient does require min cueing for lifitng but is progressing very well. HEP was progressed this visit. She would benefit from continued skilled PT to reduce lower back pain with standing and walking by improving core and gluteal control and reducing lower back tightness.    PT Treatment/Interventions ADLs/Self Care Home Management;Cryotherapy;Electrical Stimulation;Moist Heat;Traction;Neuromuscular re-education;Therapeutic exercise;Therapeutic activities;Functional mobility training;Gait training;Patient/family education;Manual techniques;Dry needling;Passive range of motion;Taping;Spinal Manipulations;Joint Manipulations    PT Next Visit Plan Assess HEP and progress PRN, hip stretching, core and hip strengthening    PT Home Exercise Plan  [email or web address]: seated posterior pelvic tilt, supine pelvic tilt with feet elevated on swiss ball, LTR, supine hip flexor stretch edge of bed, sidelying clamshell, bridge with  ball squeeze, 90-90 alternating toe taps, SLR, deadlift    Consulted and Agree with Plan of Care Patient           Patient will benefit from skilled therapeutic intervention in order to improve the following deficits and impairments:  Decreased strength, Pain, Decreased activity tolerance, Impaired flexibility, Decreased range of motion  Visit Diagnosis: Chronic bilateral low back pain, unspecified whether sciatica present  Muscle weakness (generalized)  Other abnormalities of gait and mobility     Problem List Patient Active Problem List   Diagnosis Date Noted   Insulin resistance 06/11/2020   Back pain 04/28/2020   OAB (overactive bladder) 04/28/2020   Skin lesion of scalp 01/11/2018   Pedal edema 07/11/2017   Esophageal reflux 07/28/2015   Generalized abdominal pain 07/28/2015   Menorrhagia 05/04/2015   Allergic rhinitis 09/05/2014   Hyperlipidemia, mixed 08/24/2014   Absence of bladder continence 08/24/2014   Hyponatremia 08/24/2014   Insomnia 04/02/2014   Amenorrhea 02/23/2014   Ovarian cyst 08/17/2012   Cervical cancer screening 03/02/2012   Allergic state 03/02/2012   Sinusitis 01/08/2012   Preventative health care 12/11/2011   HTN (hypertension) 12/09/2011   Obese 12/09/2011   Heartburn     Rosana Hoes, PT, DPT, LAT, ATC 06/17/20  9:18 AM Phone: [phone] Fax: [phone]   [facility] [address] [place], [place], [address] Phone: [phone]   Fax:  [phone]  Name: ADALIZ DOBIS MRN: [id number] Date of Birth: 08-17-1976

## 2020-06-20 ENCOUNTER — Encounter: Payer: Self-pay | Admitting: Family Medicine

## 2020-06-22 ENCOUNTER — Other Ambulatory Visit: Payer: Self-pay | Admitting: Family Medicine

## 2020-06-22 DIAGNOSIS — N921 Excessive and frequent menstruation with irregular cycle: Secondary | ICD-10-CM

## 2020-06-23 ENCOUNTER — Ambulatory Visit: Payer: 59 | Admitting: Physical Therapy

## 2020-06-23 ENCOUNTER — Other Ambulatory Visit: Payer: Self-pay

## 2020-06-23 ENCOUNTER — Encounter: Payer: Self-pay | Admitting: Physical Therapy

## 2020-06-23 DIAGNOSIS — M6281 Muscle weakness (generalized): Secondary | ICD-10-CM

## 2020-06-23 DIAGNOSIS — M545 Low back pain, unspecified: Secondary | ICD-10-CM

## 2020-06-23 DIAGNOSIS — R2689 Other abnormalities of gait and mobility: Secondary | ICD-10-CM

## 2020-06-23 NOTE — Patient Instructions (Signed)
Access Code: [email or web address] URL: [email or web address] Date: 06/23/2020 Prepared by: Rosana Hoes  Exercises Seated Pelvic Tilt - 3-4 x daily - 7 x weekly - 10 reps - 3-5 seconds hold Posterior Pelvic Tilt with Feet Elevated - 2 x daily - 7 x weekly - 10 reps - 3-5 seconds hold Supine Lower Trunk Rotation - 2 x daily - 7 x weekly - 5 reps - 10 seconds hold Modified Thomas Stretch - 2 x daily - 7 x weekly - 3 reps - 30 seconds hold Clamshell - 1 x daily - 7 x weekly - 20 reps - 2 sets Supine Bridge with Mini Swiss Ball Between Knees - 1 x daily - 7 x weekly - 2 sets - 10 reps - 2-3 seconds hold Supine 90/90 Alternating Toe Touch One Leg at a Time - 1 x daily - 7 x weekly - 2 sets - 10 reps Active Straight Leg Raise with Quad Set - 1 x daily - 7 x weekly - 2 sets - 10 reps Kettlebell Deadlift - 1 x daily - 7 x weekly - 3 sets - 8-10 reps Seated Hamstring Stretch - 2 x daily - 7 x weekly - 2 reps - 20 seconds hold Seated Piriformis Stretch - 2 x daily - 7 x weekly - 2 reps - 20 seconds hold

## 2020-06-23 NOTE — Therapy (Signed)
[facility] [address] [place], [place], [address] Phone: [phone]   Fax:  [phone]  Physical Therapy Treatment  Patient Details  Name: Christina Mendez MRN: [id number] Date of Birth: Nov 24, 1976 Referring Provider (PT): Rodolph Bong, MD   Encounter Date: 06/23/2020   PT End of Session - 06/23/20 0837    Visit Number 6    Number of Visits 8    Date for PT Re-Evaluation 07/01/20    Authorization Type [employer]    PT Start Time [phone]    PT Stop Time 0915    PT Time Calculation (min) 42 min    Activity Tolerance Patient tolerated treatment well    Behavior During Therapy [facility] for tasks assessed/performed           Past Medical History:  Diagnosis Date  . Acid reflux   . Allergic state 03/02/2012  . Allergy last couple of years   seasonal  . Anxiety   . Back pain   . Cervical cancer screening 03/02/2012  . Chest pain   . Chicken pox 3rd grade  . GERD (gastroesophageal reflux disease)   . GERD (gastroesophageal reflux disease)   . Heartburn   . HTN (hypertension) 12/09/2011  . Hyperlipidemia   . Hypertension   . Inflamed skin tag 03/02/2012  . Menorrhagia 05/04/2015  . OAB (overactive bladder)   . Obese 12/09/2011  . Obesity   . OSA (obstructive sleep apnea)   . Ovarian cyst 08/17/2012  . Palpitations   . Pedal edema 07/11/2017  . Sinusitis 01/08/2012  . Swollen ankles   . Swollen feet     Past Surgical History:  Procedure Laterality Date  . cyst removed  44 yr old   benign, abdominal  . HERNIA REPAIR      There were no vitals filed for this visit.   Subjective Assessment - 06/23/20 0834    Subjective Patient reports she is doing well. No new issues.    Patient Stated Goals Improve back pain and mobility    Currently in Pain? No/denies              [facility] PT Assessment - 06/23/20 0001      AROM   Lumbar Flexion Able to touch toes but remains in neutral/lordotic position                          [facility] Adult PT Treatment/Exercise - 06/23/20 0001      Exercises   Exercises Lumbar      Lumbar Exercises: Stretches   Passive Hamstring Stretch 2 reps;30 seconds    Passive Hamstring Stretch Limitations supine with manual assist    Lower Trunk Rotation 5 reps;10 seconds    Hip Flexor Stretch 2 reps;20 seconds    Hip Flexor Stretch Limitations supine edge of mat    Piriformis Stretch 2 reps;30 seconds    Piriformis Stretch Limitations supine with manual assist      Lumbar Exercises: Aerobic   Nustep L5 x 5 min (LE only)      Lumbar Exercises: Standing   Lifting Weights (lbs) 45# 4x6    Lifting Limitations Dead lift from 6" box      Lumbar Exercises: Supine   Bridge with Ball Squeeze 10 reps;3 seconds    Basic Lumbar Stabilization Limitations 90-90 alternating heel taps 2x10    Straight Leg Raise 10 reps      Lumbar Exercises: Sidelying  Hip Abduction 10 reps   2 sets                 PT Education - 06/23/20 0836    Education Details HEP    Person(s) Educated Patient    Methods Explanation;Verbal cues;Demonstration;Handout    Comprehension Verbalized understanding;Returned demonstration;Verbal cues required;Need further instruction            PT Short Term Goals - 06/02/20 0907      PT SHORT TERM GOAL #1   Title Patient will be I with initial HEP to maintain progress with PT    Time 4    Period Weeks    Status On-going    Target Date 06/03/20      PT SHORT TERM GOAL #2   Title Patient will be able to walk and stand >/= 10 minutes with needing seated break    Baseline Patient reports 15-20 minutes    Time 4    Period Weeks    Status Achieved    Target Date 06/03/20             PT Long Term Goals - 05/06/20 1445      PT LONG TERM GOAL #1   Title Patient will be I with final HEP to maintain progress from PT    Time 8    Period Weeks    Status New    Target Date 07/01/20      PT LONG TERM GOAL #2   Title  Patient will exhibit improved core and hip strength to >/= 4/5 MMT to improve postural control and reduce pain with walking    Time 8    Period Weeks    Status New    Target Date 07/01/20      PT LONG TERM GOAL #3   Title Patient will be able to stand and walk >/= 30 minutes without needing seated break    Time 8    Period Weeks    Status New    Target Date 07/01/20      PT LONG TERM GOAL #4   Title Patient will be able to perform all dressing and self care with no difficulty    Time 8    Period Weeks    Status New    Target Date 07/01/20      PT LONG TERM GOAL #5   Title Patient will report improved functional ability of </= 45% limitation on FOTO    Time 8    Period Weeks    Status New    Target Date 07/01/20                 Plan - 06/23/20 0837    Clinical Impression Statement Patient tolerated therapy well with no adverse effects. She is progressing well with dead lift progression and reports much improvement with walking and standing ability. She was provided with addtional stretches to perform at home and states they really help to loosen up her hips and back after exercise and in the morning. She would benefit from continued skilled PT to reduce lower back pain with standing and walking by improving core and gluteal control and reducing lower back tightness.    PT Treatment/Interventions ADLs/Self Care Home Management;Cryotherapy;Electrical Stimulation;Moist Heat;Traction;Neuromuscular re-education;Therapeutic exercise;Therapeutic activities;Functional mobility training;Gait training;Patient/family education;Manual techniques;Dry needling;Passive range of motion;Taping;Spinal Manipulations;Joint Manipulations    PT Next Visit Plan Assess HEP and progress PRN, hip stretching, core and hip strengthening    PT Home Exercise Plan [email or web address]:  seated posterior pelvic tilt, supine pelvic tilt with feet elevated on swiss ball, LTR, supine hip flexor stretch edge of bed, sidelying  clamshell, bridge with ball squeeze, 90-90 alternating toe taps, SLR, seated hamstring stretch, seated piriformis stretch, deadlift    Consulted and Agree with Plan of Care Patient           Patient will benefit from skilled therapeutic intervention in order to improve the following deficits and impairments:  Decreased strength, Pain, Decreased activity tolerance, Impaired flexibility, Decreased range of motion  Visit Diagnosis: Chronic bilateral low back pain, unspecified whether sciatica present  Muscle weakness (generalized)  Other abnormalities of gait and mobility     Problem List Patient Active Problem List   Diagnosis Date Noted  . Insulin resistance 06/11/2020  . Back pain 04/28/2020  . OAB (overactive bladder) 04/28/2020  . Skin lesion of scalp 01/11/2018  . Pedal edema 07/11/2017  . Esophageal reflux 07/28/2015  . Generalized abdominal pain 07/28/2015  . Menorrhagia 05/04/2015  . Allergic rhinitis 09/05/2014  . Hyperlipidemia, mixed 08/24/2014  . Absence of bladder continence 08/24/2014  . Hyponatremia 08/24/2014  . Insomnia 04/02/2014  . Amenorrhea 02/23/2014  . Ovarian cyst 08/17/2012  . Cervical cancer screening 03/02/2012  . Allergic state 03/02/2012  . Sinusitis 01/08/2012  . Preventative health care 12/11/2011  . HTN (hypertension) 12/09/2011  . Obese 12/09/2011  . Heartburn     Rosana Hoes, PT, DPT, LAT, ATC 06/23/20  9:14 AM Phone: [phone] Fax: [phone]   [facility] : [facility] [address] [place], [place], [address] Phone: [phone]   Fax:  [phone]  Name: Christina Mendez MRN: [id number] Date of Birth: 12-Apr-1976

## 2020-06-24 ENCOUNTER — Encounter (INDEPENDENT_AMBULATORY_CARE_PROVIDER_SITE_OTHER): Payer: Self-pay | Admitting: Bariatrics

## 2020-06-24 ENCOUNTER — Ambulatory Visit (INDEPENDENT_AMBULATORY_CARE_PROVIDER_SITE_OTHER): Payer: 59 | Admitting: Bariatrics

## 2020-06-24 ENCOUNTER — Other Ambulatory Visit (INDEPENDENT_AMBULATORY_CARE_PROVIDER_SITE_OTHER): Payer: 59

## 2020-06-24 ENCOUNTER — Other Ambulatory Visit: Payer: Self-pay

## 2020-06-24 VITALS — BP 130/84 | HR 80 | Temp 98.1°F | Ht 67.0 in | Wt 376.0 lb

## 2020-06-24 DIAGNOSIS — Z9189 Other specified personal risk factors, not elsewhere classified: Secondary | ICD-10-CM | POA: Diagnosis not present

## 2020-06-24 DIAGNOSIS — I10 Essential (primary) hypertension: Secondary | ICD-10-CM | POA: Diagnosis not present

## 2020-06-24 DIAGNOSIS — E559 Vitamin D deficiency, unspecified: Secondary | ICD-10-CM | POA: Diagnosis not present

## 2020-06-24 DIAGNOSIS — E8881 Metabolic syndrome: Secondary | ICD-10-CM | POA: Diagnosis not present

## 2020-06-24 DIAGNOSIS — N921 Excessive and frequent menstruation with irregular cycle: Secondary | ICD-10-CM | POA: Diagnosis not present

## 2020-06-24 LAB — CBC WITH DIFFERENTIAL/PLATELET
Basophils Absolute: 0.1 10*3/uL (ref 0.0–0.1)
Basophils Relative: 1.1 % (ref 0.0–3.0)
Eosinophils Absolute: 0.1 10*3/uL (ref 0.0–0.7)
Eosinophils Relative: 2.4 % (ref 0.0–5.0)
HCT: 39 % (ref 36.0–46.0)
Hemoglobin: 13.4 g/dL (ref 12.0–15.0)
Lymphocytes Relative: 34.5 % (ref 12.0–46.0)
Lymphs Abs: 1.7 10*3/uL (ref 0.7–4.0)
MCHC: 34.2 g/dL (ref 30.0–36.0)
MCV: 88.7 fl (ref 78.0–100.0)
Monocytes Absolute: 0.3 10*3/uL (ref 0.1–1.0)
Monocytes Relative: 7.1 % (ref 3.0–12.0)
Neutro Abs: 2.6 10*3/uL (ref 1.4–7.7)
Neutrophils Relative %: 54.9 % (ref 43.0–77.0)
Platelets: 289 10*3/uL (ref 150.0–400.0)
RBC: 4.4 Mil/uL (ref 3.87–5.11)
RDW: 14.1 % (ref 11.5–15.5)
WBC: 4.8 10*3/uL (ref 4.0–10.5)

## 2020-06-24 LAB — COMPREHENSIVE METABOLIC PANEL
ALT: 29 U/L (ref 0–35)
AST: 19 U/L (ref 0–37)
Albumin: 4.4 g/dL (ref 3.5–5.2)
Alkaline Phosphatase: 57 U/L (ref 39–117)
BUN: 13 mg/dL (ref 6–23)
CO2: 30 mEq/L (ref 19–32)
Calcium: 9.2 mg/dL (ref 8.4–10.5)
Chloride: 101 mEq/L (ref 96–112)
Creatinine, Ser: 0.86 mg/dL (ref 0.40–1.20)
GFR: 86.16 mL/min (ref >60.00)
Glucose, Bld: 101 mg/dL — ABNORMAL HIGH (ref 70–99)
Potassium: 3.9 mEq/L (ref 3.5–5.1)
Sodium: 137 mEq/L (ref 135–145)
Total Bilirubin: 0.5 mg/dL (ref 0.2–1.2)
Total Protein: 7.1 g/dL (ref 6.0–8.3)

## 2020-06-24 LAB — TSH: TSH: 0.78 u[IU]/mL (ref 0.35–4.50)

## 2020-06-24 MED ORDER — VITAMIN D (ERGOCALCIFEROL) 1.25 MG (50000 UNIT) PO CAPS: 50000.0000 [IU] | ORAL_CAPSULE | ORAL | 0 refills | Status: DC

## 2020-06-24 NOTE — Progress Notes (Signed)
Chief Complaint:   OBESITY Christina Mendez is here to discuss her progress with her obesity treatment plan along with follow-up of her obesity related diagnoses. Christina Mendez is on the Category 4 Plan minus 100 calories and states she is following her eating plan approximately 90% of the time. Christina Mendez states she is swimming 30 minutes 4 times per week and using resistance bands 30 minutes 2 times per week.  Today's visit was #: 2 Starting weight: 376 lbs Starting date: 06/10/2020 Today's weight: 373 lbs Today's date: 06/24/2020 Total lbs lost to date: 0 Total lbs lost since last in-office visit: 3  Interim History: Christina Mendez is down 3 lbs. She liked the food on the meal plan.  Subjective:   Vitamin D deficiency. Christina Mendez is taking OTC Vitamin D supplementation.    Ref. Range 06/10/2020 14:21  Vitamin D, 25-Hydroxy Latest Ref Range: 30.0 - 100.0 ng/mL 17.6 (L)   Insulin resistance. Christina Mendez has a diagnosis of insulin resistance based on her elevated fasting insulin level >5. She continues to work on diet and exercise to decrease her risk of diabetes. Christina Mendez is on no medications.  Lab Results  Component Value Date   INSULIN 35.9 (H) 06/10/2020   Lab Results  Component Value Date   HGBA1C 5.6 06/10/2020   Essential hypertension. Blood pressure is controlled.  BP Readings from Last 3 Encounters:  06/24/20 130/84  06/10/20 115/72  05/13/20 (!) 130/94   Lab Results  Component Value Date   CREATININE 0.86 06/24/2020   CREATININE 0.89 06/10/2020   CREATININE 0.78 01/27/2020   At risk for osteoporosis. Thomasina is at higher risk of osteopenia and osteoporosis due to Vitamin D deficiency.   Assessment/Plan:   Vitamin D deficiency. Low Vitamin D level contributes to fatigue and are associated with obesity, breast, and colon cancer. She was given a prescription for Vitamin D, Ergocalciferol, (DRISDOL) 1.25 MG (50000 UNIT) CAPS capsule every week #4 with 0 refills and will  follow-up for routine testing of Vitamin D, at least 2-3 times per year to avoid over-replacement.   Insulin resistance. Christina Mendez will continue to work on weight loss, increasing exercise, increasing healthy fats and protein, and decreasing simple carbohydrates to help decrease the risk of diabetes. Christina Mendez agreed to follow-up with [place] as directed to closely monitor her progress.  Essential hypertension. Christina Mendez is working on healthy weight loss and exercise to improve blood pressure control. We will watch for signs of hypotension as she continues her lifestyle modifications. She will continue her medication as directed.   At risk for osteoporosis. Christina Mendez was given approximately 15 minutes of osteoporosis prevention counseling today. Christina Mendez is at risk for osteopenia and osteoporosis due to her Vitamin D deficiency. She was encouraged to take her Vitamin D and follow her higher calcium diet and increase strengthening exercise to help strengthen her bones and decrease her risk of osteopenia and osteoporosis.  Repetitive spaced learning was employed today to elicit superior memory formation and behavioral change.  Class 3 severe obesity with serious comorbidity and body mass index (BMI) of 50.0 to 59.9 in adult, unspecified obesity type (HCC).  Story is currently in the action stage of change. As such, her goal is to continue with weight loss efforts. She has agreed to the Category 4 Plan minus 100 calories.  She will work on meal planning.  We reviewed labs with the patient from 06/10/2020 including CMP, Vitamin D, A1c, insulin, and thyroid panel.  Handout was provided on Smart Fruit Choices.  Exercise goals: All adults should avoid inactivity. Some physical activity is better than none, and adults who participate in any amount of physical activity gain some health benefits.  Behavioral modification strategies: increasing lean protein intake, decreasing simple carbohydrates, increasing  vegetables, increasing water intake, decreasing eating out, no skipping meals, meal planning and cooking strategies, keeping healthy foods in the home and planning for success.  Christina Mendez has agreed to follow-up with our clinic in 2 weeks. She was informed of the importance of frequent follow-up visits to maximize her success with intensive lifestyle modifications for her multiple health conditions.   Objective:   Blood pressure 130/84, pulse 80, temperature 98.1 F (36.7 C), height 5\' 7"  (1.702 m), weight (!) 376 lb (170.6 kg), last menstrual period 06/02/2020, SpO2 94 %. Body mass index is 58.89 kg/m.  General: Cooperative, alert, well developed, in no acute distress. HEENT: Conjunctivae and lids unremarkable. Cardiovascular: Regular rhythm.  Lungs: Normal work of breathing. Neurologic: No focal deficits.   Lab Results  Component Value Date   CREATININE 0.86 06/24/2020   BUN 13 06/24/2020   NA 137 06/24/2020   K 3.9 06/24/2020   CL 101 06/24/2020   CO2 30 06/24/2020   Lab Results  Component Value Date   ALT 29 06/24/2020   AST 19 06/24/2020   ALKPHOS 57 06/24/2020   BILITOT 0.5 06/24/2020   Lab Results  Component Value Date   HGBA1C 5.6 06/10/2020   Lab Results  Component Value Date   INSULIN 35.9 (H) 06/10/2020   Lab Results  Component Value Date   TSH 0.78 06/24/2020   Lab Results  Component Value Date   CHOL 189 01/27/2020   HDL 52.00 01/27/2020   LDLCALC 107 (H) 01/27/2020   TRIG 151.0 (H) 01/27/2020   CHOLHDL 4 01/27/2020   Lab Results  Component Value Date   WBC 4.8 06/24/2020   HGB 13.4 06/24/2020   HCT 39.0 06/24/2020   MCV 88.7 06/24/2020   PLT 289.0 06/24/2020   No results found for: IRON, TIBC, FERRITIN  Attestation Statements:   Reviewed by clinician on day of visit: allergies, medications, problem list, medical history, surgical history, family history, social history, and previous encounter notes.  06/26/2020, am acting as  Fernanda Drum for Energy manager, DO   I have reviewed the above documentation for accuracy and completeness, and I agree with the above. [employer], DO

## 2020-06-25 ENCOUNTER — Encounter (INDEPENDENT_AMBULATORY_CARE_PROVIDER_SITE_OTHER): Payer: Self-pay | Admitting: Bariatrics

## 2020-06-29 ENCOUNTER — Ambulatory Visit (INDEPENDENT_AMBULATORY_CARE_PROVIDER_SITE_OTHER): Payer: 59 | Admitting: Psychology

## 2020-06-30 ENCOUNTER — Other Ambulatory Visit: Payer: Self-pay | Admitting: Family Medicine

## 2020-06-30 DIAGNOSIS — N921 Excessive and frequent menstruation with irregular cycle: Secondary | ICD-10-CM

## 2020-06-30 DIAGNOSIS — N926 Irregular menstruation, unspecified: Secondary | ICD-10-CM

## 2020-06-30 DIAGNOSIS — N83209 Unspecified ovarian cyst, unspecified side: Secondary | ICD-10-CM

## 2020-07-08 ENCOUNTER — Encounter: Payer: Self-pay | Admitting: Physical Therapy

## 2020-07-08 ENCOUNTER — Other Ambulatory Visit: Payer: Self-pay

## 2020-07-08 ENCOUNTER — Ambulatory Visit: Payer: 59 | Admitting: Physical Therapy

## 2020-07-08 DIAGNOSIS — G8929 Other chronic pain: Secondary | ICD-10-CM

## 2020-07-08 DIAGNOSIS — M6281 Muscle weakness (generalized): Secondary | ICD-10-CM

## 2020-07-08 DIAGNOSIS — R2689 Other abnormalities of gait and mobility: Secondary | ICD-10-CM

## 2020-07-08 DIAGNOSIS — M545 Low back pain: Secondary | ICD-10-CM | POA: Diagnosis not present

## 2020-07-08 NOTE — Therapy (Signed)
[facility] Outpatient Rehabilitation [facility] [address] [place], [place], [address] Phone: [phone]   Fax:  [phone]  Physical Therapy Treatment    Progress Note Reporting Period 05/06/2020 to 07/08/2020  See note below for Objective Data and Assessment of Progress/Goals.   Patient Details  Name: Christina Mendez MRN: [id number] Date of Birth: 1976/11/14 Referring Provider (PT): Rodolph Bong, MD   Encounter Date: 07/08/2020   PT End of Session - 07/08/20 1004    Visit Number 7    Number of Visits 10    Date for PT Re-Evaluation 08/19/20    Authorization Type [employer]    PT Start Time 1001    PT Stop Time 1043    PT Time Calculation (min) 42 min    Activity Tolerance Patient tolerated treatment well    Behavior During Therapy [facility] for tasks assessed/performed           Past Medical History:  Diagnosis Date  . Acid reflux   . Allergic state 03/02/2012  . Allergy last couple of years   seasonal  . Anxiety   . Back pain   . Cervical cancer screening 03/02/2012  . Chest pain   . Chicken pox 3rd grade  . GERD (gastroesophageal reflux disease)   . GERD (gastroesophageal reflux disease)   . Heartburn   . HTN (hypertension) 12/09/2011  . Hyperlipidemia   . Hypertension   . Inflamed skin tag 03/02/2012  . Menorrhagia 05/04/2015  . OAB (overactive bladder)   . Obese 12/09/2011  . Obesity   . OSA (obstructive sleep apnea)   . Ovarian cyst 08/17/2012  . Palpitations   . Pedal edema 07/11/2017  . Sinusitis 01/08/2012  . Swollen ankles   . Swollen feet     Past Surgical History:  Procedure Laterality Date  . cyst removed  44 yr old   benign, abdominal  . HERNIA REPAIR      There were no vitals filed for this visit.   Subjective Assessment - 07/08/20 1003    Subjective Patient reports everything has been going well. She hasn't had as many painful days.    How long can you stand comfortably? 20 minutes    How long can you walk  comfortably? 20 minutes    Patient Stated Goals Improve back pain and mobility    Currently in Pain? No/denies              [facility] PT Assessment - 07/08/20 0001      Assessment   Medical Diagnosis Chronic bilateral low back pain with left-sided sciatica    Referring Provider (PT) Rodolph Bong, MD    Next MD Visit Not scheduled      Precautions   Precautions None      Restrictions   Weight Bearing Restrictions No      Balance Screen   Has the patient fallen in the past 6 months No    Has the patient had a decrease in activity level because of a fear of falling?  No    Is the patient reluctant to leave their home because of a fear of falling?  No      Prior Function   Level of Independence Independent      Observation/Other Assessments   Focus on Therapeutic Outcomes (FOTO)  44% limitation      AROM   Overall AROM Comments Patient exhibits grossly lumbar AROM WFL, she does remain in relatively lordotic position when bending forward  but this is improving and non-painful      Strength   Right Hip Flexion 4/5    Right Hip Extension 4/5    Right Hip ABduction 4-/5    Left Hip Flexion 4/5    Left Hip Extension 4/5    Left Hip ABduction 4-/5      Transfers   Transfers Independent with all Transfers                         [facility] Adult PT Treatment/Exercise - 07/08/20 0001      Self-Care   Self-Care Other Self-Care Comments    Other Self-Care Comments  FOTO review, progress toward goals and updated POC      Exercises   Exercises Lumbar      Lumbar Exercises: Stretches   Passive Hamstring Stretch 30 seconds    Passive Hamstring Stretch Limitations supine with manual assist    Single Knee to Chest Stretch 30 seconds    Single Knee to Chest Stretch Limitations supine with manual assist    Lower Trunk Rotation 3 reps;10 seconds    Hip Flexor Stretch 30 seconds    Hip Flexor Stretch Limitations supine edge of mat    Piriformis Stretch 30 seconds     Piriformis Stretch Limitations supine with manual assist      Lumbar Exercises: Standing   Lifting Weights (lbs) 45# 4x8    Lifting Limitations Dead lift from 6" box      Lumbar Exercises: Supine   Bridge with Ball Squeeze 10 reps;3 seconds    Basic Lumbar Stabilization Limitations 90-90 alternating heel taps 3x20 sec      Lumbar Exercises: Sidelying   Clam 15 reps   3 sets   Clam Limitations red band      Lumbar Exercises: Quadruped   Opposite Arm/Leg Raise 5 reps;3 seconds                  PT Education - 07/08/20 1004    Education Details HEP, FOTO    Person(s) Educated Patient    Methods Explanation;Demonstration;Verbal cues    Comprehension Verbalized understanding;Returned demonstration;Verbal cues required;Need further instruction            PT Short Term Goals - 07/08/20 1008      PT SHORT TERM GOAL #1   Title Patient will be I with initial HEP to maintain progress with PT    Time 4    Period Weeks    Status Achieved    Target Date 06/03/20      PT SHORT TERM GOAL #2   Title Patient will be able to walk and stand >/= 10 minutes with needing seated break    Baseline Patient reports 15-20 minutes    Time 4    Period Weeks    Status Achieved    Target Date 06/03/20             PT Long Term Goals - 07/08/20 1008      PT LONG TERM GOAL #1   Title Patient will be I with final HEP to maintain progress from PT    Time 6    Period Weeks    Status On-going    Target Date 08/19/20      PT LONG TERM GOAL #2   Title Patient will exhibit improved core and hip strength to >/= 4/5 MMT to improve postural control and reduce pain with walking    Baseline Hip  abductors continue to exhibit strength deficit    Time 6    Period Weeks    Status On-going    Target Date 08/19/20      PT LONG TERM GOAL #3   Title Patient will be able to stand and walk >/= 30 minutes without needing seated break    Baseline 20 minutes    Time 6    Period Weeks    Status  On-going    Target Date 08/19/20      PT LONG TERM GOAL #4   Title Patient will be able to perform all dressing and self care with no difficulty    Baseline Patient reports no difficulty    Time --    Period --    Status Achieved      PT LONG TERM GOAL #5   Title Patient will report improved functional ability of </= 45% limitation on FOTO    Baseline 44% limitaiton    Time --    Period --    Status Achieved    Target Date --                 Plan - 07/08/20 1007    Clinical Impression Statement Patient tolerated therapy well with no adverse effects. She reports improvement in functional level and ability to stand and walk longer periods. The frequency she experiences pain is decreasing and she is progressing well with her strengthening. She will transition to PT frequency of every other week to continue progressing of strength. She would benefit from continued skilled PT to reduce lower back pain with standing and walking by improving core and gluteal control and reducing lower back tightness.    PT Frequency Biweekly    PT Duration 6 weeks    PT Treatment/Interventions ADLs/Self Care Home Management;Cryotherapy;Electrical Stimulation;Moist Heat;Traction;Neuromuscular re-education;Therapeutic exercise;Therapeutic activities;Functional mobility training;Gait training;Patient/family education;Manual techniques;Dry needling;Passive range of motion;Taping;Spinal Manipulations;Joint Manipulations    PT Next Visit Plan Assess HEP and progress PRN, hip stretching, core and hip strengthening    PT Home Exercise Plan [email or web address]: seated posterior pelvic tilt, supine pelvic tilt with feet elevated on swiss ball, LTR, supine hip flexor stretch edge of bed, sidelying clamshell with red, bridge with ball squeeze, 90-90 alternating toe taps, SLR, seated hamstring stretch, seated piriformis stretch, deadlift    Consulted and Agree with Plan of Care Patient           Patient will benefit from  skilled therapeutic intervention in order to improve the following deficits and impairments:  Decreased strength, Pain, Decreased activity tolerance, Impaired flexibility, Decreased range of motion  Visit Diagnosis: Chronic bilateral low back pain, unspecified whether sciatica present  Muscle weakness (generalized)  Other abnormalities of gait and mobility     Problem List Patient Active Problem List   Diagnosis Date Noted  . Insulin resistance 06/11/2020  . Back pain 04/28/2020  . OAB (overactive bladder) 04/28/2020  . Skin lesion of scalp 01/11/2018  . Pedal edema 07/11/2017  . Esophageal reflux 07/28/2015  . Generalized abdominal pain 07/28/2015  . Menorrhagia 05/04/2015  . Allergic rhinitis 09/05/2014  . Hyperlipidemia, mixed 08/24/2014  . Absence of bladder continence 08/24/2014  . Hyponatremia 08/24/2014  . Insomnia 04/02/2014  . Amenorrhea 02/23/2014  . Ovarian cyst 08/17/2012  . Cervical cancer screening 03/02/2012  . Allergic state 03/02/2012  . Sinusitis 01/08/2012  . Preventative health care 12/11/2011  . HTN (hypertension) 12/09/2011  . Morbid obesity (HCC) 12/09/2011  . Heartburn  Rosana Hoesampbell Aitan Rossbach, PT, DPT, LAT, ATC 07/08/20  11:40 AM Phone: [phone] Fax: [phone]   [facility] [address] [place], [place], [address] Phone: [phone]   Fax:  [phone]  Name: Christina Mendez MRN: [id number] Date of Birth: 04-22-1976

## 2020-07-08 NOTE — Patient Instructions (Signed)
Access Code: [email or web address] URL: [email or web address] Date: 06/23/2020 Prepared by: Rosana Hoes  Exercises Seated Pelvic Tilt - 3-4 x daily - 7 x weekly - 10 reps - 3-5 seconds hold Posterior Pelvic Tilt with Feet Elevated - 2 x daily - 7 x weekly - 10 reps - 3-5 seconds hold Supine Lower Trunk Rotation - 2 x daily - 7 x weekly - 5 reps - 10 seconds hold Modified Thomas Stretch - 2 x daily - 7 x weekly - 3 reps - 30 seconds hold Clamshell with Resistance - 1 x daily - 7 x weekly - 3 sets - 15 reps Supine Bridge with Mini Swiss Ball Between Knees - 1 x daily - 7 x weekly - 2 sets - 10 reps - 2-3 seconds hold Supine 90/90 Alternating Toe Touch One Leg at a Time - 1 x daily - 7 x weekly - 3 sets - 20 seconds hold Active Straight Leg Raise with Quad Set - 1 x daily - 7 x weekly - 2 sets - 10 reps Kettlebell Deadlift - 1 x daily - 7 x weekly - 3 sets - 8-10 reps Seated Hamstring Stretch - 2 x daily - 7 x weekly - 2 reps - 20 seconds hold Seated Piriformis Stretch - 2 x daily - 7 x weekly - 2 reps - 20 seconds hold

## 2020-07-15 ENCOUNTER — Ambulatory Visit (INDEPENDENT_AMBULATORY_CARE_PROVIDER_SITE_OTHER): Payer: 59 | Admitting: Bariatrics

## 2020-07-20 ENCOUNTER — Other Ambulatory Visit (INDEPENDENT_AMBULATORY_CARE_PROVIDER_SITE_OTHER): Payer: Self-pay | Admitting: Bariatrics

## 2020-07-20 ENCOUNTER — Other Ambulatory Visit: Payer: Self-pay | Admitting: Family Medicine

## 2020-07-20 DIAGNOSIS — E559 Vitamin D deficiency, unspecified: Secondary | ICD-10-CM

## 2020-07-23 ENCOUNTER — Other Ambulatory Visit: Payer: Self-pay

## 2020-07-23 ENCOUNTER — Encounter (INDEPENDENT_AMBULATORY_CARE_PROVIDER_SITE_OTHER): Payer: Self-pay | Admitting: Bariatrics

## 2020-07-23 ENCOUNTER — Ambulatory Visit (INDEPENDENT_AMBULATORY_CARE_PROVIDER_SITE_OTHER): Payer: 59 | Admitting: Bariatrics

## 2020-07-23 VITALS — BP 128/82 | HR 73 | Temp 98.3°F | Ht 67.0 in | Wt 369.0 lb

## 2020-07-23 DIAGNOSIS — E559 Vitamin D deficiency, unspecified: Secondary | ICD-10-CM

## 2020-07-23 DIAGNOSIS — I10 Essential (primary) hypertension: Secondary | ICD-10-CM

## 2020-07-23 DIAGNOSIS — E7849 Other hyperlipidemia: Secondary | ICD-10-CM | POA: Diagnosis not present

## 2020-07-23 DIAGNOSIS — Z9189 Other specified personal risk factors, not elsewhere classified: Secondary | ICD-10-CM | POA: Diagnosis not present

## 2020-07-23 DIAGNOSIS — Z6841 Body Mass Index (BMI) 40.0 and over, adult: Secondary | ICD-10-CM

## 2020-07-23 MED ORDER — VITAMIN D (ERGOCALCIFEROL) 1.25 MG (50000 UNIT) PO CAPS: 50000.0000 [IU] | ORAL_CAPSULE | ORAL | 0 refills | Status: DC

## 2020-07-24 ENCOUNTER — Ambulatory Visit: Payer: 59 | Attending: Family Medicine | Admitting: Physical Therapy

## 2020-07-24 ENCOUNTER — Other Ambulatory Visit: Payer: Self-pay

## 2020-07-24 DIAGNOSIS — G8929 Other chronic pain: Secondary | ICD-10-CM | POA: Diagnosis present

## 2020-07-24 DIAGNOSIS — M6281 Muscle weakness (generalized): Secondary | ICD-10-CM | POA: Insufficient documentation

## 2020-07-24 DIAGNOSIS — R2689 Other abnormalities of gait and mobility: Secondary | ICD-10-CM | POA: Insufficient documentation

## 2020-07-24 DIAGNOSIS — M545 Low back pain: Secondary | ICD-10-CM | POA: Diagnosis not present

## 2020-07-24 NOTE — Therapy (Signed)
[facility] Outpatient Rehabilitation [facility] [address] [place], [place], [address] Phone: [phone]   Fax:  [phone]  Physical Therapy Treatment  Patient Details  Name: Christina Mendez MRN: [id number] Date of Birth: 1976/01/23 Referring Provider (PT): Rodolph Bong, MD   Encounter Date: 07/24/2020   PT End of Session - 07/24/20 0836    Visit Number 8    Number of Visits 10    Date for PT Re-Evaluation 08/19/20    Authorization Type [employer]    PT Start Time [phone]    PT Stop Time 0912    PT Time Calculation (min) 40 min    Activity Tolerance Patient tolerated treatment well    Behavior During Therapy [facility] for tasks assessed/performed           Past Medical History:  Diagnosis Date  . Acid reflux   . Allergic state 03/02/2012  . Allergy last couple of years   seasonal  . Anxiety   . Back pain   . Cervical cancer screening 03/02/2012  . Chest pain   . Chicken pox 3rd grade  . GERD (gastroesophageal reflux disease)   . GERD (gastroesophageal reflux disease)   . Heartburn   . HTN (hypertension) 12/09/2011  . Hyperlipidemia   . Hypertension   . Inflamed skin tag 03/02/2012  . Menorrhagia 05/04/2015  . OAB (overactive bladder)   . Obese 12/09/2011  . Obesity   . OSA (obstructive sleep apnea)   . Ovarian cyst 08/17/2012  . Palpitations   . Pedal edema 07/11/2017  . Sinusitis 01/08/2012  . Swollen ankles   . Swollen feet     Past Surgical History:  Procedure Laterality Date  . cyst removed  44 yr old   benign, abdominal  . HERNIA REPAIR      There were no vitals filed for this visit.   Subjective Assessment - 07/24/20 0835    Subjective Patient reports that everything has been going well.    Patient Stated Goals Improve back pain and mobility    Currently in Pain? No/denies              [facility] PT Assessment - 07/24/20 0001      Strength   Overall Strength Comments Core strength grossly 4/5    Right Hip ABduction 4-/5     Left Hip ABduction 4-/5                         [facility] Adult PT Treatment/Exercise - 07/24/20 0001      Exercises   Exercises Lumbar      Lumbar Exercises: Stretches   Passive Hamstring Stretch 2 reps;30 seconds    Passive Hamstring Stretch Limitations supine with manual assist    Single Knee to Chest Stretch 2 reps;30 seconds    Single Knee to Chest Stretch Limitations supine with manual assist    Lower Trunk Rotation 5 reps;10 seconds    Hip Flexor Stretch 2 reps;30 seconds    Hip Flexor Stretch Limitations supine edge of mat    Piriformis Stretch 2 reps;30 seconds    Piriformis Stretch Limitations supine with manual assist      Lumbar Exercises: Standing   Lifting Weights (lbs) 45# 4x10    Lifting Limitations Dead lift from 6" box      Lumbar Exercises: Supine   Basic Lumbar Stabilization Limitations 90-90 alternating heel taps 2x20 sec    Advanced Lumbar Stabilization Limitations 90-90 alternating leg  extension 2x20 sec      Lumbar Exercises: Sidelying   Hip Abduction 15 reps   2 sets                 PT Education - 07/24/20 0836    Education Details HEP    Person(s) Educated Patient    Methods Explanation;Demonstration;Verbal cues    Comprehension Verbalized understanding;Returned demonstration;Verbal cues required;Need further instruction            PT Short Term Goals - 07/08/20 1008      PT SHORT TERM GOAL #1   Title Patient will be I with initial HEP to maintain progress with PT    Time 4    Period Weeks    Status Achieved    Target Date 06/03/20      PT SHORT TERM GOAL #2   Title Patient will be able to walk and stand >/= 10 minutes with needing seated break    Baseline Patient reports 15-20 minutes    Time 4    Period Weeks    Status Achieved    Target Date 06/03/20             PT Long Term Goals - 07/08/20 1008      PT LONG TERM GOAL #1   Title Patient will be I with final HEP to maintain progress from PT    Time 6      Period Weeks    Status On-going    Target Date 08/19/20      PT LONG TERM GOAL #2   Title Patient will exhibit improved core and hip strength to >/= 4/5 MMT to improve postural control and reduce pain with walking    Baseline Hip abductors continue to exhibit strength deficit    Time 6    Period Weeks    Status On-going    Target Date 08/19/20      PT LONG TERM GOAL #3   Title Patient will be able to stand and walk >/= 30 minutes without needing seated break    Baseline 20 minutes    Time 6    Period Weeks    Status On-going    Target Date 08/19/20      PT LONG TERM GOAL #4   Title Patient will be able to perform all dressing and self care with no difficulty    Baseline Patient reports no difficulty    Time --    Period --    Status Achieved      PT LONG TERM GOAL #5   Title Patient will report improved functional ability of </= 45% limitation on FOTO    Baseline 44% limitaiton    Time --    Period --    Status Achieved    Target Date --                 Plan - 07/24/20 1610    Clinical Impression Statement Patient tolerated therapy well with no adverse effects. Continued progressing strengthening with good tolerance. Patient seems to be doing very well and reports continued improvement. She continues to require occasional cueing for lifting technique. She would benefit from continued skilled PT to reduce lower back pain with standing and walking by improving core and gluteal control and reducing lower back tightness.    PT Treatment/Interventions ADLs/Self Care Home Management;Cryotherapy;Electrical Stimulation;Moist Heat;Traction;Neuromuscular re-education;Therapeutic exercise;Therapeutic activities;Functional mobility training;Gait training;Patient/family education;Manual techniques;Dry needling;Passive range of motion;Taping;Spinal Manipulations;Joint Manipulations    PT Next Visit  Plan Assess HEP and progress PRN, hip stretching, core and hip strengthening    PT  Home Exercise Plan [email or web address]: seated posterior pelvic tilt, supine pelvic tilt with feet elevated on swiss ball, LTR, supine hip flexor stretch edge of bed, sidelying clamshell with red, bridge with ball squeeze, 90-90 alternating toe taps, SLR, seated hamstring stretch, seated piriformis stretch, deadlift    Consulted and Agree with Plan of Care Patient           Patient will benefit from skilled therapeutic intervention in order to improve the following deficits and impairments:  Decreased strength, Pain, Decreased activity tolerance, Impaired flexibility, Decreased range of motion  Visit Diagnosis: Chronic bilateral low back pain, unspecified whether sciatica present  Muscle weakness (generalized)  Other abnormalities of gait and mobility     Problem List Patient Active Problem List   Diagnosis Date Noted  . Insulin resistance 06/11/2020  . Back pain 04/28/2020  . OAB (overactive bladder) 04/28/2020  . Skin lesion of scalp 01/11/2018  . Pedal edema 07/11/2017  . Esophageal reflux 07/28/2015  . Generalized abdominal pain 07/28/2015  . Menorrhagia 05/04/2015  . Allergic rhinitis 09/05/2014  . Hyperlipidemia, mixed 08/24/2014  . Absence of bladder continence 08/24/2014  . Hyponatremia 08/24/2014  . Insomnia 04/02/2014  . Amenorrhea 02/23/2014  . Ovarian cyst 08/17/2012  . Cervical cancer screening 03/02/2012  . Allergic state 03/02/2012  . Sinusitis 01/08/2012  . Preventative health care 12/11/2011  . HTN (hypertension) 12/09/2011  . Morbid obesity (HCC) 12/09/2011  . Heartburn     Rosana Hoes, PT, DPT, LAT, ATC  07/24/20  9:12 AM Phone: [phone] Fax: [phone]   [facility] Outpatient Rehabilitation [facility] [address] [place], [place], [address] Phone: [phone]   Fax:  [phone]  Name: Christina Mendez MRN: [id number] Date of Birth: 1976-09-23

## 2020-07-28 ENCOUNTER — Encounter (INDEPENDENT_AMBULATORY_CARE_PROVIDER_SITE_OTHER): Payer: Self-pay | Admitting: Bariatrics

## 2020-07-28 NOTE — Progress Notes (Signed)
Chief Complaint:   OBESITY Christina Mendez is here to discuss her progress with her obesity treatment plan along with follow-up of her obesity related diagnoses. Christina Mendez is on the Category 4 Plan minus 100 calories and states she is following her eating plan approximately 80% of the time. Christina Mendez states she is doing Physical Therapy exercises 30 minutes 3 times per week.  Today's visit was #: 3 Starting weight: 376 lbs Starting date: 06/10/2020 Today's weight: 369 lbs Today's date: 07/23/2020 Total lbs lost to date: 7 Total lbs lost since last in-office visit: 7  Interim History: Christina Mendez is down 7 lbs since her last visit. She has been sticking to the plan. She does report having had some increased stress eating.  Subjective:   Other hyperlipidemia. Christina Mendez is taking Christina Mendez.  Lab Results  Component Value Date   CHOL 189 01/27/2020   HDL 52.00 01/27/2020   LDLCALC 107 (H) 01/27/2020   TRIG 151.0 (H) 01/27/2020   CHOLHDL 4 01/27/2020   Lab Results  Component Value Date   ALT 29 06/24/2020   AST 19 06/24/2020   ALKPHOS 57 06/24/2020   BILITOT 0.5 06/24/2020   The 10-year ASCVD risk score Denman George DC Jr., et al., 2013) is: 2.4%   Values used to calculate the score:     Age: 44 years     Sex: Female     Is Non-Hispanic African American: Yes     Diabetic: No     Tobacco smoker: No     Systolic Blood Pressure: 128 mmHg     Is BP treated: Yes     HDL Cholesterol: 52 mg/dL     Total Cholesterol: 189 mg/dL  Essential hypertension. Christina Mendez is taking Cozaar and Toprol XL. Blood pressure is controlled.  BP Readings from Last 3 Encounters:  07/23/20 128/82  06/24/20 130/84  06/10/20 115/72   Lab Results  Component Value Date   CREATININE 0.86 06/24/2020   CREATININE 0.89 06/10/2020   CREATININE 0.78 01/27/2020   Vitamin D deficiency. Christina Mendez is taking Vitamin D supplementation. No nausea, vomiting, or muscle weakness.    Ref. Range 06/10/2020 14:21    Vitamin D, 25-Hydroxy Latest Ref Range: 30.0 - 100.0 ng/mL 17.6 (L)   At risk for heart disease. Christina Mendez is at a higher than average risk for cardiovascular disease due to hypertension and hyperlipidemia.   Assessment/Plan:   Other hyperlipidemia. Cardiovascular risk and specific lipid/LDL goals reviewed.  We discussed several lifestyle modifications today and Sabeen will continue to work on diet, exercise and weight loss efforts. Orders and follow up as documented in patient record. Christina Mendez will avoid trans fats and increase PUFA's and MUFA's.  Counseling Intensive lifestyle modifications are the first line treatment for this issue.  Dietary changes: Increase soluble fiber. Decrease simple carbohydrates.  Exercise changes: Moderate to vigorous-intensity aerobic activity 150 minutes per week if tolerated.   Lipid-lowering medications: see documented in medical record.  Essential hypertension. Christina Mendez is working on healthy weight loss and exercise to improve blood pressure control. We will watch for signs of hypotension as she continues her lifestyle modifications. She will continue her medication as directed.   Vitamin D deficiency. Low Vitamin D level contributes to fatigue and are associated with obesity, breast, and colon cancer. She was given a prescription for Vitamin D, Ergocalciferol, (DRISDOL) 1.25 MG (50000 UNIT) CAPS capsule every week #4 with 0 refills and will follow-up for routine testing of Vitamin D, at least 2-3 times per  year to avoid over-replacement.   At risk for heart disease. Christina Mendez was given approximately 15 minutes of coronary artery disease prevention counseling today. She is 44 y.o. female and has risk factors for heart disease including obesity. We discussed intensive lifestyle modifications today with an emphasis on specific weight loss instructions and strategies.   Repetitive spaced learning was employed today to elicit superior memory formation and  behavioral change.  Class 3 severe obesity with serious comorbidity and body mass index (BMI) of 50.0 to 59.9 in adult, unspecified obesity type (HCC).  Christina Mendez is currently in the action stage of change. As such, her goal is to continue with weight loss efforts. She has agreed to the Category 4 Plan minus 100 calories.   She will work on meal planning and intentional eating.   Exercise goals: Christina Mendez will continue PT exercises 30 minutes 3 times per week.  Behavioral modification strategies: increasing lean protein intake, decreasing simple carbohydrates, increasing vegetables, increasing water intake, decreasing eating out, no skipping meals, meal planning and cooking strategies and keeping healthy foods in the home.  Christina Mendez has agreed to follow-up with our clinic in 2-3 weeks. She was informed of the importance of frequent follow-up visits to maximize her success with intensive lifestyle modifications for her multiple health conditions.   Objective:   Blood pressure 128/82, pulse 73, temperature 98.3 F (36.8 C), height 5\' 7"  (1.702 m), weight (!) 369 lb (167.4 kg), SpO2 97 %. Body mass index is 57.79 kg/m.  General: Cooperative, alert, well developed, in no acute distress. HEENT: Conjunctivae and lids unremarkable. Cardiovascular: Regular rhythm.  Lungs: Normal work of breathing. Neurologic: No focal deficits.   Lab Results  Component Value Date   CREATININE 0.86 06/24/2020   BUN 13 06/24/2020   NA 137 06/24/2020   K 3.9 06/24/2020   CL 101 06/24/2020   CO2 30 06/24/2020   Lab Results  Component Value Date   ALT 29 06/24/2020   AST 19 06/24/2020   ALKPHOS 57 06/24/2020   BILITOT 0.5 06/24/2020   Lab Results  Component Value Date   HGBA1C 5.6 06/10/2020   Lab Results  Component Value Date   INSULIN 35.9 (H) 06/10/2020   Lab Results  Component Value Date   TSH 0.78 06/24/2020   Lab Results  Component Value Date   CHOL 189 01/27/2020   HDL 52.00  01/27/2020   LDLCALC 107 (H) 01/27/2020   TRIG 151.0 (H) 01/27/2020   CHOLHDL 4 01/27/2020   Lab Results  Component Value Date   WBC 4.8 06/24/2020   HGB 13.4 06/24/2020   HCT 39.0 06/24/2020   MCV 88.7 06/24/2020   PLT 289.0 06/24/2020   No results found for: IRON, TIBC, FERRITIN  Attestation Statements:   Reviewed by clinician on day of visit: allergies, medications, problem list, medical history, surgical history, family history, social history, and previous encounter notes.  06/26/2020, am acting as Fernanda Drum for Energy manager, DO   I have reviewed the above documentation for accuracy and completeness, and I agree with the above. [employer], DO

## 2020-08-03 ENCOUNTER — Other Ambulatory Visit: Payer: Self-pay | Admitting: Family Medicine

## 2020-08-05 ENCOUNTER — Encounter: Payer: Self-pay | Admitting: Physical Therapy

## 2020-08-05 ENCOUNTER — Ambulatory Visit: Payer: 59 | Admitting: Physical Therapy

## 2020-08-05 ENCOUNTER — Other Ambulatory Visit: Payer: Self-pay

## 2020-08-05 DIAGNOSIS — R2689 Other abnormalities of gait and mobility: Secondary | ICD-10-CM

## 2020-08-05 DIAGNOSIS — G8929 Other chronic pain: Secondary | ICD-10-CM

## 2020-08-05 DIAGNOSIS — M545 Low back pain: Secondary | ICD-10-CM | POA: Diagnosis not present

## 2020-08-05 DIAGNOSIS — M6281 Muscle weakness (generalized): Secondary | ICD-10-CM

## 2020-08-05 NOTE — Therapy (Signed)
[place], [place], [address] Phone: [phone]   Fax:  [phone]  Physical Therapy Treatment / Discharge  Patient Details  Name: Christina Mendez MRN: [id number] Date of Birth: 05-Jan-1976 Referring Provider (PT): Gregor Hams, MD   Encounter Date: 08/05/2020   PT End of Session - 08/05/20 0959    Visit Number 9    Number of Visits 10    Date for PT Re-Evaluation 08/19/20    Authorization Type [employer]    PT Start Time 1000    PT Stop Time 1040    PT Time Calculation (min) 40 min    Activity Tolerance Patient tolerated treatment well    Behavior During Therapy [facility] for tasks assessed/performed           Past Medical History:  Diagnosis Date  . Acid reflux   . Allergic state 03/02/2012  . Allergy last couple of years   seasonal  . Anxiety   . Back pain   . Cervical cancer screening 03/02/2012  . Chest pain   . Chicken pox 3rd grade  . GERD (gastroesophageal reflux disease)   . GERD (gastroesophageal reflux disease)   . Heartburn   . HTN (hypertension) 12/09/2011  . Hyperlipidemia   . Hypertension   . Inflamed skin tag 03/02/2012  . Menorrhagia 05/04/2015  . OAB (overactive bladder)   . Obese 12/09/2011  . Obesity   . OSA (obstructive sleep apnea)   . Ovarian cyst 08/17/2012  . Palpitations   . Pedal edema 07/11/2017  . Sinusitis 01/08/2012  . Swollen ankles   . Swollen feet     Past Surgical History:  Procedure Laterality Date  . cyst removed  44 yr old   benign, abdominal  . HERNIA REPAIR      There were no vitals filed for this visit.   Subjective Assessment - 08/05/20 1027    Subjective Patient reports she is doing well with no new issues.    How long can you stand comfortably? 30 minutes    How long can you walk comfortably? 30 minutes    Patient Stated Goals Improve back pain and mobility    Currently in Pain? No/denies              [facility] PT Assessment -  08/05/20 0001      Assessment   Medical Diagnosis Chronic bilateral low back pain with left-sided sciatica    Referring Provider (PT) Gregor Hams, MD    Next MD Visit Not scheduled      Precautions   Precautions None      Restrictions   Weight Bearing Restrictions No      Balance Screen   Has the patient fallen in the past 6 months No    Has the patient had a decrease in activity level because of a fear of falling?  No    Is the patient reluctant to leave their home because of a fear of falling?  No      Observation/Other Assessments   Observations Patient appears in no apparent distress    Focus on Therapeutic Outcomes (FOTO)  30% limitation      AROM   Overall AROM Comments Patient exhibits grossly lumbar AROM WFL, she does remain in relatively lordotic position when bending forward but this is improving and non-painful      Strength   Overall Strength Comments Core strength grossly 4/5    Right Hip Flexion  4+/5    Right Hip Extension 4+/5    Right Hip ABduction 4/5    Left Hip Flexion 4+/5    Left Hip Extension 4+/5    Left Hip ABduction 4/5                         [facility] Adult PT Treatment/Exercise - 08/05/20 0001      Self-Care   Self-Care Other Self-Care Comments    Other Self-Care Comments  FOTO review, achieving all goals and discharge from PT      Exercises   Exercises Lumbar      Lumbar Exercises: Standing   Lifting Weights (lbs) 45# 4x10    Lifting Limitations Dead lift from 6" box      Lumbar Exercises: Supine   Bridge with Ball Squeeze 10 reps;3 seconds    Advanced Lumbar Stabilization Limitations 90-90 alternating leg extension 2x20 sec    Straight Leg Raise 10 reps      Lumbar Exercises: Sidelying   Hip Abduction 15 reps                  PT Education - 08/05/20 0958    Education Details HEP    Person(s) Educated Patient    Methods Explanation    Comprehension Verbalized understanding            PT Short Term  Goals - 07/08/20 1008      PT SHORT TERM GOAL #1   Title Patient will be I with initial HEP to maintain progress with PT    Time 4    Period Weeks    Status Achieved    Target Date 06/03/20      PT SHORT TERM GOAL #2   Title Patient will be able to walk and stand >/= 10 minutes with needing seated break    Baseline Patient reports 15-20 minutes    Time 4    Period Weeks    Status Achieved    Target Date 06/03/20             PT Long Term Goals - 08/05/20 1008      PT LONG TERM GOAL #1   Title Patient will be I with final HEP to maintain progress from PT    Status Achieved      PT LONG TERM GOAL #2   Title Patient will exhibit improved core and hip strength to >/= 4/5 MMT to improve postural control and reduce pain with walking    Status Achieved      PT LONG TERM GOAL #3   Title Patient will be able to stand and walk >/= 30 minutes without needing seated break    Baseline Patient reports "a good 30 minutes"    Status Achieved      PT LONG TERM GOAL #4   Title Patient will be able to perform all dressing and self care with no difficulty    Baseline Patient reports no difficulty    Status Achieved      PT LONG TERM GOAL #5   Title Patient will report improved functional ability of </= 45% limitation on FOTO    Baseline 44% limitaiton    Status Achieved                 Plan - 08/05/20 1003    Clinical Impression Statement Patient tolerated therapy well with no adverse effects. She demonstrates independence with her exercises and exhibits proper lifting  mechanics and core activation with tasks required at work. She has achieved all established goals and will be formally discharged from PT.    PT Treatment/Interventions ADLs/Self Care Home Management;Cryotherapy;Electrical Stimulation;Moist Heat;Traction;Neuromuscular re-education;Therapeutic exercise;Therapeutic activities;Functional mobility training;Gait training;Patient/family education;Manual techniques;Dry  needling;Passive range of motion;Taping;Spinal Manipulations;Joint Manipulations    PT Next Visit Plan NA - discharge    PT Home Exercise Plan [email or web address]: seated posterior pelvic tilt, supine pelvic tilt with feet elevated on swiss ball, LTR, supine hip flexor stretch edge of bed, sidelying hip abduction, bridge with ball squeeze, 90-90 alternating leg extensions, SLR, seated hamstring stretch, seated piriformis stretch, deadlift    Consulted and Agree with Plan of Care Patient           Patient will benefit from skilled therapeutic intervention in order to improve the following deficits and impairments:  Decreased strength, Pain, Decreased activity tolerance, Impaired flexibility, Decreased range of motion  Visit Diagnosis: Chronic bilateral low back pain, unspecified whether sciatica present  Muscle weakness (generalized)  Other abnormalities of gait and mobility     Problem List Patient Active Problem List   Diagnosis Date Noted  . Insulin resistance 06/11/2020  . Back pain 04/28/2020  . OAB (overactive bladder) 04/28/2020  . Skin lesion of scalp 01/11/2018  . Pedal edema 07/11/2017  . Esophageal reflux 07/28/2015  . Generalized abdominal pain 07/28/2015  . Menorrhagia 05/04/2015  . Allergic rhinitis 09/05/2014  . Hyperlipidemia, mixed 08/24/2014  . Absence of bladder continence 08/24/2014  . Hyponatremia 08/24/2014  . Insomnia 04/02/2014  . Amenorrhea 02/23/2014  . Ovarian cyst 08/17/2012  . Cervical cancer screening 03/02/2012  . Allergic state 03/02/2012  . Sinusitis 01/08/2012  . Preventative health care 12/11/2011  . HTN (hypertension) 12/09/2011  . Morbid obesity ([place]) 12/09/2011  . Heartburn      PHYSICAL THERAPY DISCHARGE SUMMARY  Visits from Start of Care: 9  Current functional level related to goals / functional outcomes: See above   Remaining deficits: See above   Education / Equipment: HEP Plan: Patient agrees to discharge.  Patient goals  were met. Patient is being discharged due to being pleased with the current functional level.  ?????      Hilda Blades, PT, DPT, LAT, ATC 08/05/20  10:39 AM Phone: [phone] Fax: [facility] [address] [place], [place], [address] Phone: [phone]   Fax:  [phone]  Name: Christina Mendez MRN: [id number] Date of Birth: 08/11/1976

## 2020-08-05 NOTE — Patient Instructions (Signed)
Access Code: [email or web address] URL: [email or web address] Date: 08/05/2020 Prepared by: Rosana Hoes  Exercises Seated Pelvic Tilt - 3-4 x daily - 7 x weekly - 10 reps - 3-5 seconds hold Posterior Pelvic Tilt with Feet Elevated - 2 x daily - 7 x weekly - 10 reps - 3-5 seconds hold Supine Lower Trunk Rotation - 2 x daily - 7 x weekly - 5 reps - 10 seconds hold Modified Thomas Stretch - 2 x daily - 7 x weekly - 3 reps - 30 seconds hold Supine Bridge with Mini Swiss Ball Between Knees - 1 x daily - 7 x weekly - 2 sets - 10 reps - 2-3 seconds hold Supine 90/90 Alternating Toe Touch One Leg at a Time - 1 x daily - 7 x weekly - 2 sets - 10 reps Active Straight Leg Raise with Quad Set - 1 x daily - 7 x weekly - 2 sets - 10 reps Sidelying Hip Abduction - 1 x daily - 7 x weekly - 2 sets - 10 reps Kettlebell Deadlift - 1 x daily - 7 x weekly - 3 sets - 8-10 reps Seated Hamstring Stretch - 2 x daily - 7 x weekly - 2 reps - 20 seconds hold Seated Piriformis Stretch - 2 x daily - 7 x weekly - 2 reps - 20 seconds hold

## 2020-08-06 ENCOUNTER — Ambulatory Visit (INDEPENDENT_AMBULATORY_CARE_PROVIDER_SITE_OTHER): Payer: 59 | Admitting: Bariatrics

## 2020-08-11 ENCOUNTER — Ambulatory Visit (INDEPENDENT_AMBULATORY_CARE_PROVIDER_SITE_OTHER): Payer: 59 | Admitting: Bariatrics

## 2020-08-11 ENCOUNTER — Encounter (INDEPENDENT_AMBULATORY_CARE_PROVIDER_SITE_OTHER): Payer: Self-pay | Admitting: Bariatrics

## 2020-08-11 ENCOUNTER — Other Ambulatory Visit: Payer: Self-pay | Admitting: Family Medicine

## 2020-08-11 ENCOUNTER — Other Ambulatory Visit: Payer: Self-pay

## 2020-08-11 VITALS — BP 137/84 | HR 67 | Temp 98.1°F | Ht 67.0 in | Wt 378.0 lb

## 2020-08-11 DIAGNOSIS — Z6841 Body Mass Index (BMI) 40.0 and over, adult: Secondary | ICD-10-CM

## 2020-08-11 DIAGNOSIS — E559 Vitamin D deficiency, unspecified: Secondary | ICD-10-CM

## 2020-08-11 DIAGNOSIS — R600 Localized edema: Secondary | ICD-10-CM | POA: Diagnosis not present

## 2020-08-11 DIAGNOSIS — Z9189 Other specified personal risk factors, not elsewhere classified: Secondary | ICD-10-CM

## 2020-08-11 DIAGNOSIS — R7303 Prediabetes: Secondary | ICD-10-CM

## 2020-08-11 MED ORDER — VITAMIN D (ERGOCALCIFEROL) 1.25 MG (50000 UNIT) PO CAPS: 50000.0000 [IU] | ORAL_CAPSULE | ORAL | 0 refills | Status: DC

## 2020-08-11 NOTE — Progress Notes (Signed)
Chief Complaint:   OBESITY Christina Mendez is here to discuss her progress with her obesity treatment plan along with follow-up of her obesity related diagnoses. Christina Mendez is on the Category 4 Plan minus 100 calories and states she is following her eating plan approximately 75-80% of the time. Christina Mendez states she is swimming 60-90 minutes 4-6 times per week.  Today's visit was #: 4 Starting weight: 376 lbs Starting date: 06/10/2020 Today's weight: 378 lbs Today's date: 08/11/2020 Total lbs lost to date: 0 Total lbs lost since last in-office visit: 0  Interim History: Christina Mendez is up 9 lbs since her last visit. She is retaining fluid due to being on her menstrual cycle. She reports getting in adequate water.  Subjective:   Vitamin D deficiency. No nausea, vomiting, or muscle weakness.    Ref. Range 06/10/2020 14:21  Vitamin D, 25-Hydroxy Latest Ref Range: 30.0 - 100.0 ng/mL 17.6 (L)   Prediabetes. Christina Mendez has a diagnosis of prediabetes based on her elevated HgA1c and was informed this puts her at greater risk of developing diabetes. She continues to work on diet and exercise to decrease her risk of diabetes. She denies nausea or hypoglycemia. Christina Mendez is on no medication.  Lab Results  Component Value Date   HGBA1C 5.6 06/10/2020   Lab Results  Component Value Date   INSULIN 35.9 (H) 06/10/2020   Fluid retention in legs. Christina Mendez is taking Lasix, but has missed doses.  At risk for hypoglycemia. Christina Mendez is at increased risk for hypoglycemia due to prediabetes.  Assessment/Plan:   Vitamin D deficiency. Low Vitamin D level contributes to fatigue and are associated with obesity, breast, and colon cancer. She was given a prescription for Vitamin D, Ergocalciferol, (DRISDOL) 1.25 MG (50000 UNIT) CAPS capsule every week #4 with 0 refills and will follow-up for routine testing of Vitamin D, at least 2-3 times per year to avoid over-replacement.   Prediabetes. Christina Mendez will  continue to work on weight loss, increasing exercise, and decreasing simple carbohydrates (starches and candy) to help decrease the risk of diabetes.   Fluid retention in legs. Christina Mendez will resume Lasix and take as directed. She will elevate her feet when possible.  At risk for hypoglycemia. Christina Mendez was given approximately 15 minutes of counseling today regarding prevention of hypoglycemia. She was advised of symptoms of hypoglycemia. Christina Mendez was instructed to avoid skipping meals, eat regular protein rich meals and schedule low calorie snacks as needed.   Repetitive spaced learning was employed today to elicit superior memory formation and behavioral change.  Class 3 severe obesity with serious comorbidity and body mass index (BMI) of 50.0 to 59.9 in adult, unspecified obesity type (HCC).  Christina Mendez is currently in the action stage of change. As such, her goal is to continue with weight loss efforts. She has agreed to the Category 4 Plan minus 100 calories.   She will work on meal planning, intentional eating, and decreasing her carb intake.  Exercise goals: All adults should avoid inactivity. Some physical activity is better than none, and adults who participate in any amount of physical activity gain some health benefits.  Behavioral modification strategies: increasing lean protein intake, decreasing simple carbohydrates, increasing vegetables, increasing water intake, decreasing eating out, no skipping meals, meal planning and cooking strategies, keeping healthy foods in the home and planning for success.  Christina Mendez has agreed to follow-up with our clinic in 2-3 weeks. She was informed of the importance of frequent follow-up visits to maximize her success with  intensive lifestyle modifications for her multiple health conditions.   Objective:   Blood pressure 137/84, pulse 67, temperature 98.1 F (36.7 C), height 5\' 7"  (1.702 m), weight (!) 378 lb (171.5 kg), SpO2 98 %. Body mass index is  59.2 kg/m.  General: Cooperative, alert, well developed, in no acute distress. HEENT: Conjunctivae and lids unremarkable. Cardiovascular: Regular rhythm.  Lungs: Normal work of breathing. Neurologic: No focal deficits.   Lab Results  Component Value Date   CREATININE 0.86 06/24/2020   BUN 13 06/24/2020   NA 137 06/24/2020   K 3.9 06/24/2020   CL 101 06/24/2020   CO2 30 06/24/2020   Lab Results  Component Value Date   ALT 29 06/24/2020   AST 19 06/24/2020   ALKPHOS 57 06/24/2020   BILITOT 0.5 06/24/2020   Lab Results  Component Value Date   HGBA1C 5.6 06/10/2020   Lab Results  Component Value Date   INSULIN 35.9 (H) 06/10/2020   Lab Results  Component Value Date   TSH 0.78 06/24/2020   Lab Results  Component Value Date   CHOL 189 01/27/2020   HDL 52.00 01/27/2020   LDLCALC 107 (H) 01/27/2020   TRIG 151.0 (H) 01/27/2020   CHOLHDL 4 01/27/2020   Lab Results  Component Value Date   WBC 4.8 06/24/2020   HGB 13.4 06/24/2020   HCT 39.0 06/24/2020   MCV 88.7 06/24/2020   PLT 289.0 06/24/2020   No results found for: IRON, TIBC, FERRITIN  Attestation Statements:   Reviewed by clinician on day of visit: allergies, medications, problem list, medical history, surgical history, family history, social history, and previous encounter notes.  06/26/2020, am acting as Fernanda Drum for Mendez manager, DO   I have reviewed the above documentation for accuracy and completeness, and I agree with the above. [employer], DO

## 2020-08-27 ENCOUNTER — Other Ambulatory Visit (HOSPITAL_COMMUNITY)
Admission: RE | Admit: 2020-08-27 | Discharge: 2020-08-27 | Disposition: A | Discharge status: Home / Self Care | Payer: 59 | Source: Ambulatory Visit | Attending: Obstetrics & Gynecology | Admitting: Obstetrics & Gynecology

## 2020-08-27 ENCOUNTER — Encounter: Payer: Self-pay | Admitting: Obstetrics & Gynecology

## 2020-08-27 ENCOUNTER — Ambulatory Visit (INDEPENDENT_AMBULATORY_CARE_PROVIDER_SITE_OTHER): Payer: 59 | Admitting: Obstetrics & Gynecology

## 2020-08-27 ENCOUNTER — Other Ambulatory Visit: Payer: Self-pay

## 2020-08-27 VITALS — BP 129/68 | HR 74 | Ht 67.0 in | Wt 381.0 lb

## 2020-08-27 DIAGNOSIS — N939 Abnormal uterine and vaginal bleeding, unspecified: Secondary | ICD-10-CM

## 2020-08-27 DIAGNOSIS — Z01419 Encounter for gynecological examination (general) (routine) without abnormal findings: Secondary | ICD-10-CM | POA: Diagnosis present

## 2020-08-27 MED ORDER — NORETHIN ACE-ETH ESTRAD-FE 1-20 MG-MCG(24) PO TABS: 1.0000 | ORAL_TABLET | Freq: Every day | ORAL | 11 refills | Status: DC

## 2020-08-27 NOTE — Progress Notes (Signed)
Subjective:     Christina Mendez is a 44 y.o. female here for a routine exam. G1P0010 Current complaints: h/o heavy and prolonged cycles since menarche. Last cycles was 19 days. This was the fist cycles ever longer than 11 days. No assoc sx. Was on OCPs pev which regulated her cycles. Not sexually active at present.     Gynecologic History Patient's last menstrual period was 08/11/2020. Contraception: abstinence Last Pap: 04/30/2014. Results were: normal Last mammogram: 01/29/2020. Results were: normal  Obstetric History OB History  Gravida Para Term Preterm AB Living  0 0 0 0 0 0  SAB TAB Ectopic Multiple Live Births  0 0 0 0 0   The following portions of the patient's history were reviewed and updated as appropriate: allergies, current medications, past family history, past medical history, past social history, past surgical history and problem list.  Review of Systems Pertinent items are noted in HPI.    Objective:  BP 129/68   Pulse 74   Ht 5\' 7"  (1.702 m)   Wt (!) 381 lb (172.8 kg)   LMP 08/11/2020   BMI 59.67 kg/m  General Appearance:    Alert, cooperative, no distress, appears stated age  Head:    Normocephalic, without obvious abnormality, atraumatic  Eyes:    conjunctiva/corneas clear, EOM's intact, both eyes  Ears:    Normal external ear canals, both ears  Nose:   Nares normal, septum midline, mucosa normal, no drainage    or sinus tenderness  Throat:   Lips, mucosa, and tongue normal; teeth and gums normal  Neck:   Supple, symmetrical, trachea midline, no adenopathy;    thyroid:  no enlargement/tenderness/nodules  Back:     Symmetric, no curvature, ROM normal, no CVA tenderness  Lungs:     respirations unlabored  Chest Wall:    No tenderness or deformity   Heart:    Regular rate and rhythm  Breast Exam:    No tenderness, masses, or nipple abnormality  Abdomen:     Soft, non-tender, bowel sounds active all four quadrants,    no masses, no organomegaly   Genitalia:    Normal female without lesion, discharge or tenderness     Extremities:   Extremities normal, atraumatic, no cyanosis or edema  Pulses:   2+ and symmetric all extremities  Skin:   Skin color, texture, turgor normal, no rashes or lesions    Assessment:    Healthy female exam.   AUB- possible perimenopause which has exacerbated her reg heavy cycle      Plan:  Diagnoses and all orders for this visit:  Well female exam with routine gynecological exam -     Norethindrone Acetate-Ethinyl Estrad-FE (LOESTRIN 24 FE) 1-20 MG-MCG(24) tablet; Take 1 tablet by mouth daily. -     Cytology - PAP( [place])  Abnormal uterine bleeding (AUB) -     08/13/2020 Pelvis Complete; Future -     US Transvaginal Non-OB; Future -     Norethindrone Acetate-Ethinyl Estrad-FE (LOESTRIN 24 FE) 1-20 MG-MCG(24) tablet; Take 1 tablet by mouth daily.  f/u in 3 months or sooner prn  Elena Cothern L. Harraway-Smith, M.D., [place]

## 2020-08-27 NOTE — Progress Notes (Signed)
Patient having abnormal periods- abnormally long in length. LMP 08/11/2020 and lasted for 14 days.  Period is lighter in nature with the exception of two days. [name] RN

## 2020-08-28 LAB — CYTOLOGY - PAP
Comment: NEGATIVE
Diagnosis: NEGATIVE
High risk HPV: NEGATIVE

## 2020-08-31 ENCOUNTER — Ambulatory Visit: Payer: 59 | Admitting: Family Medicine

## 2020-08-31 ENCOUNTER — Other Ambulatory Visit: Payer: Self-pay

## 2020-08-31 VITALS — BP 128/68 | HR 83 | Temp 98.2°F | Resp 12 | Ht 67.0 in | Wt 383.4 lb

## 2020-08-31 DIAGNOSIS — E8881 Metabolic syndrome: Secondary | ICD-10-CM

## 2020-08-31 DIAGNOSIS — R12 Heartburn: Secondary | ICD-10-CM

## 2020-08-31 DIAGNOSIS — E782 Mixed hyperlipidemia: Secondary | ICD-10-CM

## 2020-08-31 DIAGNOSIS — N3281 Overactive bladder: Secondary | ICD-10-CM | POA: Diagnosis not present

## 2020-08-31 DIAGNOSIS — E88819 Insulin resistance, unspecified: Secondary | ICD-10-CM

## 2020-08-31 DIAGNOSIS — Z23 Encounter for immunization: Secondary | ICD-10-CM | POA: Diagnosis not present

## 2020-08-31 DIAGNOSIS — I10 Essential (primary) hypertension: Secondary | ICD-10-CM | POA: Diagnosis not present

## 2020-08-31 DIAGNOSIS — E559 Vitamin D deficiency, unspecified: Secondary | ICD-10-CM | POA: Insufficient documentation

## 2020-08-31 NOTE — Assessment & Plan Note (Signed)
Supplement and monitor 

## 2020-08-31 NOTE — Assessment & Plan Note (Signed)
Encouraged DASH or MIND diet, decrease po intake and increase exercise as tolerated. Needs 7-8 hours of sleep nightly. Avoid trans fats, eat small, frequent meals every 4-5 hours with lean proteins, complex carbs and healthy fats. Minimize simple carbs 

## 2020-08-31 NOTE — Assessment & Plan Note (Signed)
Avoid offending foods, start probiotics. Do not eat large meals in late evening and consider raising head of bed.  

## 2020-08-31 NOTE — Progress Notes (Signed)
Subjective:    Patient ID: Christina Mendez, female    DOB: Apr 14, 1976, 44 y.o.   MRN: [id number]  Chief Complaint  Patient presents with  . 4 month followup    HPI Patient is in today for follow up on chronic medical concerns. No recent febrile illness or hospitalizations. She is stressed about the fact that her mother who has previously been healthy is starting to have significant health concerns. Patient's job is going well. Denies CP/palp/SOB/HA/congestion/fevers/GI or GU c/o. Taking meds as prescribed  Past Medical History:  Diagnosis Date  . Acid reflux   . Allergic state 03/02/2012  . Allergy last couple of years   seasonal  . Anxiety   . Back pain   . Cervical cancer screening 03/02/2012  . Chest pain   . Chicken pox 3rd grade  . GERD (gastroesophageal reflux disease)   . GERD (gastroesophageal reflux disease)   . Heartburn   . HTN (hypertension) 12/09/2011  . Hyperlipidemia   . Hypertension   . Inflamed skin tag 03/02/2012  . Menorrhagia 05/04/2015  . OAB (overactive bladder)   . Obese 12/09/2011  . Obesity   . OSA (obstructive sleep apnea)   . Ovarian cyst 08/17/2012  . Palpitations   . Pedal edema 07/11/2017  . Sinusitis 01/08/2012  . Swollen ankles   . Swollen feet   . Vaginal Pap smear, abnormal     Past Surgical History:  Procedure Laterality Date  . cyst removed  44 yr old   benign, abdominal  . HERNIA REPAIR      Family History  Problem Relation Age of Onset  . Fibromyalgia Mother   . Diabetes Mother        type 2  . Hypertension Mother   . Thyroid disease Mother   . Diabetes Father        type 2  . Heart disease Father 48       2 MI  . Hypertension Father   . Heart attack Father 23       X 2  . Stroke Father        mini strokes  . GER disease Father   . Hyperlipidemia Father   . Cholelithiasis Father   . Venous thrombosis Father        in liver  . Colon polyps Father   . Sleep apnea Father   . Obesity Father   . Hypertension  Brother   . Venous thrombosis Brother        on spine  . Cancer Maternal Grandfather        stomach  . Hypertension Paternal Grandmother   . Diabetes Paternal Grandmother        type 2  . Heart attack Paternal Grandmother   . Heart disease Paternal Grandmother   . Heart attack Paternal Grandfather   . Heart disease Paternal Grandfather   . Hypertension Paternal Grandfather   . Cancer Maternal Uncle        prostate cancer    Social History   Socioeconomic History  . Marital status: Single    Spouse name: Not on file  . Number of children: Not on file  . Years of education: Not on file  . Highest education level: Bachelor's degree (e.g., BA, AB, BS)  Occupational History  . Occupation: Data processing manager  Tobacco Use  . Smoking status: Never Smoker  . Smokeless tobacco: Never Used  Vaping Use  . Vaping Use: Never used  Substance and  Sexual Activity  . Alcohol use: Yes    Comment: OCCASIONALLY  . Drug use: No  . Sexual activity: Yes    Partners: Male    Comment: No dietary restrictions, lives with Mom, Dad and brother and niece.  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 Not on file  Social History Narrative  . Not on file   Social Determinants of Health   Financial Resource Strain:   . Difficulty of Paying Living Expenses: Not on file  Food Insecurity:   . Worried About Programme researcher, broadcasting/film/videounning Out of Food in the Last Year: Not on file  . Ran Out of Food in the Last Year: Not on file  Transportation Needs:   . Lack of Transportation (Medical): Not on file  . Lack of Transportation (Non-Medical): Not on file  Physical Activity:   . Days of Exercise per Week: Not on file  . Minutes of Exercise per Session: Not on file  Stress:   . Feeling of Stress : Not on file  Social Connections:   . Frequency of Communication with Friends and Family: Not on file  . Frequency of Social Gatherings with Friends and Family: Not on file  . Attends Religious Services: Not on file    . Active Member of Clubs or Organizations: Not on file  . Attends BankerClub or Organization Meetings: Not on file  . Marital Status: Not on file  Intimate Partner Violence:   . Fear of Current or Ex-Partner: Not on file  . Emotionally Abused: Not on file  . Physically Abused: Not on file  . Sexually Abused: Not on file    Outpatient Medications Prior to Visit  Medication Sig Dispense Refill  . acetaminophen (TYLENOL) 500 MG tablet Take 500 mg by mouth every 6 (six) hours as needed. 2-3 tabs po qd prn    . aspirin EC 81 MG tablet Take 81 mg by mouth daily. Swallow whole.    . cholecalciferol (VITAMIN D) 25 MCG (1000 UNIT) tablet Take 1,000 Units by mouth daily.    . [employer] Omega-3 Krill Oil 500 MG CAPS Take by mouth.    . cyclobenzaprine (FLEXERIL) 10 MG tablet Take 1 tablet (10 mg total) by mouth 3 (three) times daily as needed for muscle spasms. 90 tablet 2  . fluticasone (FLONASE) 50 MCG/ACT nasal spray Place into both nostrils daily.    . furosemide (LASIX) 20 MG tablet Take 2 tablets (40 mg total) by mouth 2 (two) times daily as needed for edema. 60 tablet 3  . HYDROcodone-acetaminophen (NORCO/VICODIN) 5-325 MG tablet Take 1 tablet by mouth 3 (three) times daily as needed for moderate pain. 30 tablet 0  . Loratadine 10 MG CAPS Take by mouth.    . losartan (COZAAR) 25 MG tablet TAKE ONE TABLET BY MOUTH ONE TIME DAILY 90 tablet 1  . metoprolol succinate (TOPROL-XL) 25 MG 24 hr tablet TAKE ONE TABLET BY MOUTH ONE TIME DAILY 90 tablet 1  . Multiple Vitamins-Minerals (WOMENS MULTI VITAMIN & MINERAL PO) Take by mouth.    . Norethindrone Acetate-Ethinyl Estrad-FE (LOESTRIN 24 FE) 1-20 MG-MCG(24) tablet Take 1 tablet by mouth daily. 28 tablet 11  . omeprazole (PRILOSEC) 40 MG capsule TAKE ONE CAPSULE BY MOUTH ONE TIME DAILY 90 capsule 0  . Probiotic Product (PROBIOTIC-10 PO) Take by mouth.    . triamcinolone (KENALOG) 0.025 % ointment Apply 1 application topically 2 (two) times daily as needed. 30 g  1  . Vitamin D, Ergocalciferol, (DRISDOL)  1.25 MG (50000 UNIT) CAPS capsule Take 1 capsule (50,000 Units total) by mouth every 7 (seven) days. 4 capsule 0  . doxycycline (VIBRAMYCIN) 100 MG capsule Take 100 mg by mouth 2 (two) times daily.    Marland Kitchen oxybutynin (DITROPAN-XL) 10 MG 24 hr tablet TAKE ONE TABLET BY MOUTH DAILY AT BEDTIME  30 tablet 0   No facility-administered medications prior to visit.    Allergies  Allergen Reactions  . Skelaxin Hypertension    Review of Systems  Constitutional: Negative for fever and malaise/fatigue.  HENT: Negative for congestion.   Eyes: Negative for blurred vision.  Respiratory: Negative for shortness of breath.   Cardiovascular: Negative for chest pain, palpitations and leg swelling.  Gastrointestinal: Negative for abdominal pain, blood in stool and nausea.  Genitourinary: Negative for dysuria and frequency.  Musculoskeletal: Negative for falls.  Skin: Negative for rash.  Neurological: Negative for dizziness, loss of consciousness and headaches.  Endo/Heme/Allergies: Negative for environmental allergies.  Psychiatric/Behavioral: Negative for depression. The patient is not nervous/anxious.        Objective:    Physical Exam Vitals and nursing note reviewed.  Constitutional:      General: She is not in acute distress.    Appearance: She is well-developed.  HENT:     Head: Normocephalic and atraumatic.     Nose: Nose normal.  Eyes:     General:        Right eye: No discharge.        Left eye: No discharge.  Cardiovascular:     Rate and Rhythm: Normal rate and regular rhythm.     Heart sounds: No murmur heard.   Pulmonary:     Effort: Pulmonary effort is normal.     Breath sounds: Normal breath sounds.  Abdominal:     General: Bowel sounds are normal.     Palpations: Abdomen is soft.     Tenderness: There is no abdominal tenderness.  Musculoskeletal:     Cervical back: Normal range of motion and neck supple.  Skin:    General: Skin  is warm and dry.  Neurological:     Mental Status: She is alert and oriented to person, place, and time.     BP 128/68 (BP Location: Left Arm, Patient Position: Sitting, Cuff Size: Large)   Pulse 83   Temp 98.2 F (36.8 C) (Oral)   Resp 12   Ht 5\' 7"  (1.702 m)   Wt (!) 383 lb 6.4 oz (173.9 kg)   LMP 08/11/2020   SpO2 98%   BMI 60.05 kg/m  Wt Readings from Last 3 Encounters:  08/31/20 (!) 383 lb 6.4 oz (173.9 kg)  08/27/20 (!) 381 lb (172.8 kg)  08/11/20 (!) 378 lb (171.5 kg)    Diabetic Foot Exam - Simple   No data filed     Lab Results  Component Value Date   WBC 4.8 06/24/2020   HGB 13.4 06/24/2020   HCT 39.0 06/24/2020   PLT 289.0 06/24/2020   GLUCOSE 101 (H) 06/24/2020   CHOL 189 01/27/2020   TRIG 151.0 (H) 01/27/2020   HDL 52.00 01/27/2020   LDLCALC 107 (H) 01/27/2020   ALT 29 06/24/2020   AST 19 06/24/2020   NA 137 06/24/2020   K 3.9 06/24/2020   CL 101 06/24/2020   CREATININE 0.86 06/24/2020   BUN 13 06/24/2020   CO2 30 06/24/2020   TSH 0.78 06/24/2020   HGBA1C 5.6 06/10/2020    Lab Results  Component Value  Date   TSH 0.78 06/24/2020   Lab Results  Component Value Date   WBC 4.8 06/24/2020   HGB 13.4 06/24/2020   HCT 39.0 06/24/2020   MCV 88.7 06/24/2020   PLT 289.0 06/24/2020   Lab Results  Component Value Date   NA 137 06/24/2020   K 3.9 06/24/2020   CO2 30 06/24/2020   GLUCOSE 101 (H) 06/24/2020   BUN 13 06/24/2020   CREATININE 0.86 06/24/2020   BILITOT 0.5 06/24/2020   ALKPHOS 57 06/24/2020   AST 19 06/24/2020   ALT 29 06/24/2020   PROT 7.1 06/24/2020   ALBUMIN 4.4 06/24/2020   CALCIUM 9.2 06/24/2020   GFR 86.16 06/24/2020   Lab Results  Component Value Date   CHOL 189 01/27/2020   Lab Results  Component Value Date   HDL 52.00 01/27/2020   Lab Results  Component Value Date   LDLCALC 107 (H) 01/27/2020   Lab Results  Component Value Date   TRIG 151.0 (H) 01/27/2020   Lab Results  Component Value Date   CHOLHDL  4 01/27/2020   Lab Results  Component Value Date   HGBA1C 5.6 06/10/2020       Assessment & Plan:   Problem List Items Addressed This Visit    Heartburn    Avoid offending foods, start probiotics. Do not eat large meals in late evening and consider raising head of bed.       HTN (hypertension)    Well controlled, no changes to meds. Encouraged heart healthy diet such as the DASH diet and exercise as tolerated.       Relevant Orders   CBC   Comprehensive metabolic panel   TSH   Morbid obesity (HCC)    Encouraged DASH or MIND diet, decrease po intake and increase exercise as tolerated. Needs 7-8 hours of sleep nightly. Avoid trans fats, eat small, frequent meals every 4-5 hours with lean proteins, complex carbs and healthy fats. Minimize simple carbs..       Hyperlipidemia, mixed    Encouraged heart healthy diet, increase exercise, avoid trans fats, consider a krill oil cap daily      Relevant Orders   Lipid panel   OAB (overactive bladder)    Is seeing urology and tried oxybutynin XL and Myrebetriq but neither worked so is not doing anything at this time      Insulin resistance    hgba1c acceptable, minimize simple carbs. Increase exercise as tolerated.      Relevant Orders   Hemoglobin A1c   Vitamin D deficiency    Supplement and monitor      Relevant Orders   VITAMIN D 25 Hydroxy (Vit-D Deficiency, Fractures)      I have discontinued Demi L. Quevedo's oxybutynin and doxycycline. I am also having her maintain her triamcinolone, HYDROcodone-acetaminophen, cyclobenzaprine, aspirin EC, Loratadine, acetaminophen, cholecalciferol, Multiple Vitamins-Minerals (WOMENS MULTI VITAMIN & MINERAL PO), [employer] Omega-3 Krill Oil, fluticasone, Probiotic Product (PROBIOTIC-10 PO), omeprazole, metoprolol succinate, losartan, Vitamin D (Ergocalciferol), furosemide, and Norethindrone Acetate-Ethinyl Estrad-FE.  No orders of the defined types were placed in this  encounter.    Danise Edge, MD

## 2020-08-31 NOTE — Assessment & Plan Note (Signed)
Well controlled, no changes to meds. Encouraged heart healthy diet such as the DASH diet and exercise as tolerated.  °

## 2020-08-31 NOTE — Assessment & Plan Note (Addendum)
Is seeing urology and tried oxybutynin XL and Myrebetriq but neither worked so is not doing anything at this time

## 2020-08-31 NOTE — Patient Instructions (Signed)

## 2020-08-31 NOTE — Assessment & Plan Note (Signed)
Encouraged heart healthy diet, increase exercise, avoid trans fats, consider a krill oil cap daily 

## 2020-08-31 NOTE — Assessment & Plan Note (Signed)
hgba1c acceptable, minimize simple carbs. Increase exercise as tolerated.  

## 2020-09-01 ENCOUNTER — Encounter (INDEPENDENT_AMBULATORY_CARE_PROVIDER_SITE_OTHER): Payer: Self-pay | Admitting: Bariatrics

## 2020-09-01 ENCOUNTER — Ambulatory Visit (INDEPENDENT_AMBULATORY_CARE_PROVIDER_SITE_OTHER): Payer: 59 | Admitting: Bariatrics

## 2020-09-01 ENCOUNTER — Other Ambulatory Visit: Payer: Self-pay

## 2020-09-01 VITALS — BP 129/81 | HR 77 | Temp 98.5°F | Ht 67.0 in | Wt 379.0 lb

## 2020-09-01 DIAGNOSIS — E8881 Metabolic syndrome: Secondary | ICD-10-CM | POA: Diagnosis not present

## 2020-09-01 DIAGNOSIS — I10 Essential (primary) hypertension: Secondary | ICD-10-CM | POA: Diagnosis not present

## 2020-09-01 DIAGNOSIS — Z9189 Other specified personal risk factors, not elsewhere classified: Secondary | ICD-10-CM

## 2020-09-01 DIAGNOSIS — Z6841 Body Mass Index (BMI) 40.0 and over, adult: Secondary | ICD-10-CM

## 2020-09-01 DIAGNOSIS — E559 Vitamin D deficiency, unspecified: Secondary | ICD-10-CM | POA: Diagnosis not present

## 2020-09-01 MED ORDER — VITAMIN D (ERGOCALCIFEROL) 1.25 MG (50000 UNIT) PO CAPS: 50000.0000 [IU] | ORAL_CAPSULE | ORAL | 0 refills | Status: DC

## 2020-09-03 NOTE — Progress Notes (Signed)
Chief Complaint:   Christina Mendez is here to discuss her progress with her Christina treatment plan along with follow-up of her Christina related diagnoses. Christina Mendez is on the Category 4 Plan and states she is following her eating plan approximately 50% of the time. Christina Mendez states she is swimming 90 minutes 6 times per week.  Today's visit was #: 5 Starting weight: 376 lbs Starting date: 06/10/2020 Today's weight: 379 lbs Today's date: 09/01/2020 Total lbs lost to date: 0 Total lbs lost since last in-office visit: 0  Interim History: Christina Mendez is up 1 lb and states that she stopped her diuretic temporarily and is now trying to adjust the dose.  Subjective:   Insulin resistance. Christina Mendez has a diagnosis of insulin resistance based on her elevated fasting insulin level >5. She continues to work on diet and exercise to decrease her risk of diabetes. Christina Mendez is on no medication.  Lab Results  Component Value Date   INSULIN 35.9 (H) 06/10/2020   Lab Results  Component Value Date   HGBA1C 5.6 06/10/2020   Essential hypertension. Blood pressure is controlled.  BP Readings from Last 3 Encounters:  09/01/20 129/81  08/31/20 128/68  08/27/20 129/68   Lab Results  Component Value Date   CREATININE 0.86 06/24/2020   CREATININE 0.89 06/10/2020   CREATININE 0.78 01/27/2020   Vitamin D deficiency. Christina Mendez reports minimal sunlight exposure.   Ref. Range 06/10/2020 14:21  Vitamin D, 25-Hydroxy Latest Ref Range: 30.0 - 100.0 ng/mL 17.6 (L)   At risk for heart disease. Christina Mendez is at a higher than average risk for cardiovascular disease due to hypertension and hyperlipidemia.   Assessment/Plan:   Insulin resistance. Christina Mendez will continue to work on weight loss, exercise, increasing healthy fats and protein, and decreasing simple carbohydrates to help decrease the risk of diabetes. Christina Mendez agreed to follow-up with [place] as directed to closely monitor her  progress.  Essential hypertension. Christina Mendez is working on healthy weight loss and exercise to improve blood pressure control. We will watch for signs of hypotension as she continues her lifestyle modifications. She will continue her medication as directed.   Vitamin D deficiency. Low Vitamin D level contributes to fatigue and are associated with Christina, breast, and colon cancer. She was given a prescription for Vitamin D, Ergocalciferol, (DRISDOL) 1.25 MG (50000 UNIT) CAPS capsule every week #4 with 0 refills and will follow-up for routine testing of Vitamin D, at least 2-3 times per year to avoid over-replacement.   At risk for heart disease. Christina Mendez was given approximately 15 minutes of coronary artery disease prevention counseling today. She is 44 y.o. female and has risk factors for heart disease including Christina. We discussed intensive lifestyle modifications today with an emphasis on specific weight loss instructions and strategies.   Repetitive spaced learning was employed today to elicit superior memory formation and behavioral change.  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  Christina Mendez is currently in the action stage of change. As such, her goal is to continue with weight loss efforts. She has agreed to the Category 4 Plan.   She will work on meal planning, will restart her diuretic and take consistently, and will get back to the plan 80%.  Handout was provided on Eating Out.  Exercise goals: Christina Mendez will continue swimming for exercise.  Behavioral modification strategies: increasing lean protein intake, decreasing simple carbohydrates, increasing vegetables, increasing water  intake, decreasing eating out, no skipping meals, meal planning and cooking strategies, keeping healthy foods in the home and planning for success.  Christina Mendez has agreed to follow-up with our clinic in 3 weeks. She was  informed of the importance of frequent follow-up visits to maximize her success with intensive lifestyle modifications for her multiple health conditions.   Objective:   Blood pressure 129/81, pulse 77, temperature 98.5 F (36.9 C), height 5\' 7"  (1.702 m), weight (!) 379 lb (171.9 kg), last menstrual period 08/11/2020, SpO2 100 %. Body mass index is 59.36 kg/m.  General: Cooperative, alert, well developed, in no acute distress. HEENT: Conjunctivae and lids unremarkable. Cardiovascular: Regular rhythm.  Lungs: Normal work of breathing. Neurologic: No focal deficits.  Extremities: 2+ bilateral ankle edema noted.  Lab Results  Component Value Date   CREATININE 0.86 06/24/2020   BUN 13 06/24/2020   NA 137 06/24/2020   K 3.9 06/24/2020   CL 101 06/24/2020   CO2 30 06/24/2020   Lab Results  Component Value Date   ALT 29 06/24/2020   AST 19 06/24/2020   ALKPHOS 57 06/24/2020   BILITOT 0.5 06/24/2020   Lab Results  Component Value Date   HGBA1C 5.6 06/10/2020   Lab Results  Component Value Date   INSULIN 35.9 (H) 06/10/2020   Lab Results  Component Value Date   TSH 0.78 06/24/2020   Lab Results  Component Value Date   CHOL 189 01/27/2020   HDL 52.00 01/27/2020   LDLCALC 107 (H) 01/27/2020   TRIG 151.0 (H) 01/27/2020   CHOLHDL 4 01/27/2020   Lab Results  Component Value Date   WBC 4.8 06/24/2020   HGB 13.4 06/24/2020   HCT 39.0 06/24/2020   MCV 88.7 06/24/2020   PLT 289.0 06/24/2020   No results found for: IRON, TIBC, FERRITIN  Attestation Statements:   Reviewed by clinician on day of visit: allergies, medications, problem list, medical history, surgical history, family history, social history, and previous encounter notes.  06/26/2020, am acting as Fernanda Drum for Energy manager, DO   I have reviewed the above documentation for accuracy and completeness, and I agree with the above. [employer], DO

## 2020-09-08 ENCOUNTER — Encounter: Payer: Self-pay | Admitting: Obstetrics & Gynecology

## 2020-09-10 NOTE — Addendum Note (Signed)
Addended by: Mervin Kung A on: 09/10/2020 09:53 AM   Modules accepted: Orders

## 2020-09-11 ENCOUNTER — Encounter: Payer: Self-pay | Admitting: Family Medicine

## 2020-09-11 ENCOUNTER — Other Ambulatory Visit: Payer: Self-pay | Admitting: Family Medicine

## 2020-09-11 MED ORDER — HYDROCODONE-ACETAMINOPHEN 5-325 MG PO TABS
1.0000 | ORAL_TABLET | Freq: Three times a day (TID) | ORAL | 0 refills | Status: DC | PRN
Start: 2020-09-11 — End: 2020-09-12

## 2020-09-12 ENCOUNTER — Other Ambulatory Visit: Payer: Self-pay | Admitting: Family Medicine

## 2020-09-12 MED ORDER — HYDROCODONE-ACETAMINOPHEN 5-325 MG PO TABS
1.0000 | ORAL_TABLET | Freq: Three times a day (TID) | ORAL | 0 refills | Status: DC | PRN
Start: 2020-09-12 — End: 2020-09-15

## 2020-09-14 ENCOUNTER — Other Ambulatory Visit (INDEPENDENT_AMBULATORY_CARE_PROVIDER_SITE_OTHER): Payer: 59

## 2020-09-14 ENCOUNTER — Other Ambulatory Visit: Payer: Self-pay

## 2020-09-14 ENCOUNTER — Ambulatory Visit (HOSPITAL_BASED_OUTPATIENT_CLINIC_OR_DEPARTMENT_OTHER)
Admission: RE | Admit: 2020-09-14 | Discharge: 2020-09-14 | Disposition: A | Discharge status: Home / Self Care | Payer: 59 | Source: Ambulatory Visit | Attending: Obstetrics & Gynecology | Admitting: Obstetrics & Gynecology

## 2020-09-14 DIAGNOSIS — E8881 Metabolic syndrome: Secondary | ICD-10-CM

## 2020-09-14 DIAGNOSIS — E559 Vitamin D deficiency, unspecified: Secondary | ICD-10-CM

## 2020-09-14 DIAGNOSIS — N939 Abnormal uterine and vaginal bleeding, unspecified: Secondary | ICD-10-CM | POA: Insufficient documentation

## 2020-09-14 DIAGNOSIS — E88819 Insulin resistance, unspecified: Secondary | ICD-10-CM

## 2020-09-14 DIAGNOSIS — E782 Mixed hyperlipidemia: Secondary | ICD-10-CM

## 2020-09-14 DIAGNOSIS — I10 Essential (primary) hypertension: Secondary | ICD-10-CM

## 2020-09-15 ENCOUNTER — Other Ambulatory Visit: Payer: Self-pay | Admitting: Family Medicine

## 2020-09-15 ENCOUNTER — Encounter: Payer: Self-pay | Admitting: Family Medicine

## 2020-09-15 LAB — TSH: TSH: 0.47 mIU/L

## 2020-09-15 LAB — LIPID PANEL
Cholesterol: 180 mg/dL (ref <200)
HDL: 54 mg/dL (ref >=50)
LDL Cholesterol (Calc): 109 mg/dL (calc) — ABNORMAL HIGH
Non-HDL Cholesterol (Calc): 126 mg/dL (calc) (ref <130)
Total CHOL/HDL Ratio: 3.3 (calc) (ref <5.0)
Triglycerides: 77 mg/dL (ref <150)

## 2020-09-15 LAB — CBC
HCT: 40.5 % (ref 35.0–45.0)
Hemoglobin: 13.5 g/dL (ref 11.7–15.5)
MCH: 29.5 pg (ref 27.0–33.0)
MCHC: 33.3 g/dL (ref 32.0–36.0)
MCV: 88.6 fL (ref 80.0–100.0)
MPV: 11.5 fL (ref 7.5–12.5)
Platelets: 259 10*3/uL (ref 140–400)
RBC: 4.57 10*6/uL (ref 3.80–5.10)
RDW: 13.4 % (ref 11.0–15.0)
WBC: 4.4 10*3/uL (ref 3.8–10.8)

## 2020-09-15 LAB — HEMOGLOBIN A1C
Hgb A1c MFr Bld: 5.6 % of total Hgb (ref <5.7)
Mean Plasma Glucose: 114 (calc)
eAG (mmol/L): 6.3 (calc)

## 2020-09-15 LAB — COMPREHENSIVE METABOLIC PANEL
AG Ratio: 1.4 (calc) (ref 1.0–2.5)
ALT: 19 U/L (ref 6–29)
AST: 19 U/L (ref 10–35)
Albumin: 4 g/dL (ref 3.6–5.1)
Alkaline phosphatase (APISO): 52 U/L (ref 31–125)
BUN: 14 mg/dL (ref 7–25)
CO2: 27 mmol/L (ref 20–32)
Calcium: 9.2 mg/dL (ref 8.6–10.2)
Chloride: 104 mmol/L (ref 98–110)
Creat: 0.82 mg/dL (ref 0.50–1.10)
Globulin: 2.9 g/dL (calc) (ref 1.9–3.7)
Glucose, Bld: 95 mg/dL (ref 65–99)
Potassium: 4.2 mmol/L (ref 3.5–5.3)
Sodium: 139 mmol/L (ref 135–146)
Total Bilirubin: 0.5 mg/dL (ref 0.2–1.2)
Total Protein: 6.9 g/dL (ref 6.1–8.1)

## 2020-09-15 LAB — VITAMIN D 25 HYDROXY (VIT D DEFICIENCY, FRACTURES): Vit D, 25-Hydroxy: 39 ng/mL (ref 30–100)

## 2020-09-15 MED ORDER — HYDROCODONE-ACETAMINOPHEN 5-325 MG PO TABS
1.0000 | ORAL_TABLET | Freq: Three times a day (TID) | ORAL | 0 refills | Status: DC | PRN
Start: 2020-09-15 — End: 2021-01-08

## 2020-09-22 ENCOUNTER — Encounter (INDEPENDENT_AMBULATORY_CARE_PROVIDER_SITE_OTHER): Payer: Self-pay | Admitting: Bariatrics

## 2020-09-22 ENCOUNTER — Ambulatory Visit (INDEPENDENT_AMBULATORY_CARE_PROVIDER_SITE_OTHER): Payer: 59 | Admitting: Bariatrics

## 2020-09-22 ENCOUNTER — Other Ambulatory Visit: Payer: Self-pay

## 2020-09-22 VITALS — BP 116/74 | HR 61 | Temp 98.3°F | Ht 67.0 in | Wt 372.0 lb

## 2020-09-22 DIAGNOSIS — Z6841 Body Mass Index (BMI) 40.0 and over, adult: Secondary | ICD-10-CM

## 2020-09-22 DIAGNOSIS — N939 Abnormal uterine and vaginal bleeding, unspecified: Secondary | ICD-10-CM

## 2020-09-22 DIAGNOSIS — E7849 Other hyperlipidemia: Secondary | ICD-10-CM

## 2020-09-22 DIAGNOSIS — N926 Irregular menstruation, unspecified: Secondary | ICD-10-CM

## 2020-09-22 DIAGNOSIS — E559 Vitamin D deficiency, unspecified: Secondary | ICD-10-CM | POA: Diagnosis not present

## 2020-09-22 DIAGNOSIS — I10 Essential (primary) hypertension: Secondary | ICD-10-CM | POA: Diagnosis not present

## 2020-09-22 NOTE — Progress Notes (Signed)
Chief Complaint:   OBESITY Christina Mendez is here to discuss her progress with her obesity treatment plan along with follow-up of her obesity related diagnoses. Christina Mendez is on the Category 4 Plan and states she is following her eating plan approximately 60% of the time. Christina Mendez states she is swimming 60 minutes 6 times per week.  Today's visit was #: 6 Starting weight: 376 lbs Starting date: 06/10/2020 Today's weight: 372 lbs Today's date: 09/22/2020 Total lbs lost to date: 4 Total lbs lost since last in-office visit: 7  Interim History: Christina Mendez is down 7 lbs from her last visit. She reports doing well with her water and protein intake. She is cooking more.  Subjective:   Other hyperlipidemia. Christina Mendez is taking Omega-3 and Krill Oil.   Lab Results  Component Value Date   CHOL 180 09/14/2020   HDL 54 09/14/2020   LDLCALC 109 (H) 09/14/2020   TRIG 77 09/14/2020   CHOLHDL 3.3 09/14/2020   Lab Results  Component Value Date   ALT 19 09/14/2020   AST 19 09/14/2020   ALKPHOS 57 06/24/2020   BILITOT 0.5 09/14/2020   The 10-year ASCVD risk score Denman George DC Jr., et al., 2013) is: 1.4%   Values used to calculate the score:     Age: 44 years     Sex: Female     Is Non-Hispanic African American: Yes     Diabetic: No     Tobacco smoker: No     Systolic Blood Pressure: 116 mmHg     Is BP treated: Yes     HDL Cholesterol: 54 mg/dL     Total Cholesterol: 180 mg/dL  Essential hypertension. Christina Mendez is taking Cozaar and Toprol XR. Blood pressure is well controlled.  BP Readings from Last 3 Encounters:  09/22/20 116/74  09/01/20 129/81  08/31/20 128/68   Lab Results  Component Value Date   CREATININE 0.82 09/14/2020   CREATININE 0.86 06/24/2020   CREATININE 0.89 06/10/2020   Vitamin D deficiency. Christina Mendez is taking Vitamin D supplementation.    Ref. Range 09/14/2020 09:34  Vitamin D, 25-Hydroxy Latest Ref Range: 30 - 100 ng/mL 39   Assessment/Plan:   Other  hyperlipidemia. Cardiovascular risk and specific lipid/LDL goals reviewed.  We discussed several lifestyle modifications today and Christina Mendez will continue to work on diet, exercise and weight loss efforts. Orders and follow up as documented in patient record. She will continue Christina Mendez. She will increase MUFA's and PUFA's and will decrease saturated fat in her diet.  Counseling Intensive lifestyle modifications are the first line treatment for this issue. . Dietary changes: Increase soluble fiber. Decrease simple carbohydrates. . Exercise changes: Moderate to vigorous-intensity aerobic activity 150 minutes per week if tolerated. . Lipid-lowering medications: see documented in medical record.  Essential hypertension. Christina Mendez is working on healthy weight loss and exercise to improve blood pressure control. We will watch for signs of hypotension as she continues her lifestyle modifications. She will continue her medications as directed.   Vitamin D deficiency. Low Vitamin D level contributes to fatigue and are associated with obesity, breast, and colon cancer. She agrees to continue to take Vitamin D as directed will follow-up for routine testing of Vitamin D, at least 2-3 times per year to avoid over-replacement.  Class 3 severe obesity with serious comorbidity and body mass index (BMI) of 50.0 to 59.9 in adult, unspecified obesity type (HCC).  Christina Mendez is currently in the action stage of change. As such, her goal  is to continue with weight loss efforts. She has agreed to the Category 4 Plan.   She will work on meal planning, mindful eating, and will do more cooking at home.  Exercise goals: All adults should avoid inactivity. Some physical activity is better than none, and adults who participate in any amount of physical activity gain some health benefits.  Behavioral modification strategies: increasing lean protein intake, decreasing simple carbohydrates, increasing vegetables, increasing water  intake, decreasing eating out, no skipping meals, meal planning and cooking strategies, keeping healthy foods in the home and planning for success.  Christina Mendez has agreed to follow-up with our clinic in 3 weeks. She was informed of the importance of frequent follow-up visits to maximize her success with intensive lifestyle modifications for her multiple health conditions.   Objective:   Blood pressure 116/74, pulse 61, temperature 98.3 F (36.8 C), height 5\' 7"  (1.702 m), weight (!) 372 lb (168.7 kg), last menstrual period 09/14/2020, SpO2 99 %. Body mass index is 58.26 kg/m.  General: Cooperative, alert, well developed, in no acute distress. HEENT: Conjunctivae and lids unremarkable. Cardiovascular: Regular rhythm.  Lungs: Normal work of breathing. Neurologic: No focal deficits.   Lab Results  Component Value Date   CREATININE 0.82 09/14/2020   BUN 14 09/14/2020   NA 139 09/14/2020   K 4.2 09/14/2020   CL 104 09/14/2020   CO2 27 09/14/2020   Lab Results  Component Value Date   ALT 19 09/14/2020   AST 19 09/14/2020   ALKPHOS 57 06/24/2020   BILITOT 0.5 09/14/2020   Lab Results  Component Value Date   HGBA1C 5.6 09/14/2020   HGBA1C 5.6 06/10/2020   Lab Results  Component Value Date   INSULIN 35.9 (H) 06/10/2020   Lab Results  Component Value Date   TSH 0.47 09/14/2020   Lab Results  Component Value Date   CHOL 180 09/14/2020   HDL 54 09/14/2020   LDLCALC 109 (H) 09/14/2020   TRIG 77 09/14/2020   CHOLHDL 3.3 09/14/2020   Lab Results  Component Value Date   WBC 4.4 09/14/2020   HGB 13.5 09/14/2020   HCT 40.5 09/14/2020   MCV 88.6 09/14/2020   PLT 259 09/14/2020   No results found for: IRON, TIBC, FERRITIN  Attestation Statements:   Reviewed by clinician on day of visit: allergies, medications, problem list, medical history, surgical history, family history, social history, and previous encounter notes.  Time spent on visit including pre-visit chart  review and post-visit charting and care was 20 minutes.   11/14/2020, am acting as Fernanda Drum for Energy manager, DO   I have reviewed the above documentation for accuracy and completeness, and I agree with the above. [employer], DO

## 2020-10-02 ENCOUNTER — Encounter: Payer: Self-pay | Admitting: Family Medicine

## 2020-10-02 ENCOUNTER — Other Ambulatory Visit: Payer: Self-pay

## 2020-10-02 DIAGNOSIS — N939 Abnormal uterine and vaginal bleeding, unspecified: Secondary | ICD-10-CM

## 2020-10-03 ENCOUNTER — Other Ambulatory Visit: Payer: Self-pay

## 2020-10-03 ENCOUNTER — Encounter: Payer: Self-pay | Admitting: Emergency Medicine

## 2020-10-03 ENCOUNTER — Ambulatory Visit
Admission: EM | Admit: 2020-10-03 | Discharge: 2020-10-03 | Disposition: A | Discharge status: Home / Self Care | Payer: 59

## 2020-10-03 DIAGNOSIS — H00011 Hordeolum externum right upper eyelid: Secondary | ICD-10-CM

## 2020-10-03 NOTE — ED Triage Notes (Signed)
Pt sts right upper eye lid swelling upon waking today; pt sts recently had "bump" to same are that resolved with warm compress

## 2020-10-03 NOTE — Discharge Instructions (Addendum)
Keep area(s) clean and dry. Apply hot compress / towel for 5-10 minutes 3-5 times daily. Return for worsening pain, redness, swelling, discharge, fever 

## 2020-10-03 NOTE — ED Provider Notes (Signed)
[name]    CSN: [id number] Arrival date & time: 10/03/20  0804      History   Chief Complaint Chief Complaint  Patient presents with  . Facial Swelling    HPI Christina Mendez is a 44 y.o. female  Conjunctivitis Patient presents for evaluation of upper eyelid swelling & pain in the right eye. She has noticed the above symptoms for 1 day.  Onset was sudden. Patient denies blurred vision, discharge, foreign body sensation, itching, pain, photophobia, tearing and visual field deficit. There is a history of wearing glasses.  Tried hot compress x 1 w/o relief.     Past Medical History:  Diagnosis Date  . Acid reflux   . Allergic state 03/02/2012  . Allergy last couple of years   seasonal  . Anxiety   . Back pain   . Cervical cancer screening 03/02/2012  . Chest pain   . Chicken pox 3rd grade  . GERD (gastroesophageal reflux disease)   . GERD (gastroesophageal reflux disease)   . Heartburn   . HTN (hypertension) 12/09/2011  . Hyperlipidemia   . Hypertension   . Inflamed skin tag 03/02/2012  . Menorrhagia 05/04/2015  . OAB (overactive bladder)   . Obese 12/09/2011  . Obesity   . OSA (obstructive sleep apnea)   . Ovarian cyst 08/17/2012  . Palpitations   . Pedal edema 07/11/2017  . Sinusitis 01/08/2012  . Swollen ankles   . Swollen feet   . Vaginal Pap smear, abnormal     Patient Active Problem List   Diagnosis Date Noted  . Vitamin D deficiency 08/31/2020  . Insulin resistance 06/11/2020  . Back pain 04/28/2020  . OAB (overactive bladder) 04/28/2020  . Skin lesion of scalp 01/11/2018  . Pedal edema 07/11/2017  . Esophageal reflux 07/28/2015  . Generalized abdominal pain 07/28/2015  . Menorrhagia 05/04/2015  . Allergic rhinitis 09/05/2014  . Hyperlipidemia, mixed 08/24/2014  . Absence of bladder continence 08/24/2014  . Hyponatremia 08/24/2014  . Insomnia 04/02/2014  . Amenorrhea 02/23/2014  . Ovarian cyst 08/17/2012  . Cervical cancer  screening 03/02/2012  . Allergic state 03/02/2012  . Sinusitis 01/08/2012  . Preventative health care 12/11/2011  . HTN (hypertension) 12/09/2011  . Morbid obesity (HCC) 12/09/2011  . Heartburn     Past Surgical History:  Procedure Laterality Date  . cyst removed  44 yr old   benign, abdominal  . HERNIA REPAIR      OB History    Gravida  0   Para  0   Term  0   Preterm  0   AB  0   Living  0     SAB  0   TAB  0   Ectopic  0   Multiple  0   Live Births  0            Home Medications    Prior to Admission medications   Medication Sig Start Date End Date Taking? Authorizing Provider  acetaminophen (TYLENOL) 500 MG tablet Take 500 mg by mouth every 6 (six) hours as needed. 2-3 tabs po qd prn    [provider]  aspirin EC 81 MG tablet Take 81 mg by mouth daily. Swallow whole.    [provider]  cholecalciferol (VITAMIN D) 25 MCG (1000 UNIT) tablet Take 1,000 Units by mouth daily.    [provider]  [facility] Omega-3 Krill Oil 500 MG CAPS Take by mouth.    [provider]  cyclobenzaprine (FLEXERIL) 10 MG tablet Take 1 tablet (10 mg total) by mouth 3 (three) times daily as needed for muscle spasms. 04/28/20   Bradd Canary, MD  fluticasone (FLONASE) 50 MCG/ACT nasal spray Place into both nostrils daily.    [provider]  furosemide (LASIX) 20 MG tablet Take 2 tablets (40 mg total) by mouth 2 (two) times daily as needed for edema. 08/11/20   Bradd Canary, MD  HYDROcodone-acetaminophen (NORCO/VICODIN) 5-325 MG tablet Take 1 tablet by mouth 3 (three) times daily as needed for moderate pain. 09/15/20   Bradd Canary, MD  Loratadine 10 MG CAPS Take by mouth.    [provider]  losartan (COZAAR) 25 MG tablet TAKE ONE TABLET BY MOUTH ONE TIME DAILY 08/03/20   Bradd Canary, MD  metoprolol succinate (TOPROL-XL) 25 MG 24 hr tablet TAKE ONE TABLET BY MOUTH ONE TIME DAILY 08/03/20   Bradd Canary, MD  Multiple  Vitamins-Minerals (WOMENS MULTI VITAMIN & MINERAL PO) Take by mouth.    [provider]  Norethindrone Acetate-Ethinyl Estrad-FE (LOESTRIN 24 FE) 1-20 MG-MCG(24) tablet Take 1 tablet by mouth daily. 08/27/20   Willodean Rosenthal, MD  omeprazole (PRILOSEC) 40 MG capsule TAKE ONE CAPSULE BY MOUTH ONE TIME DAILY 07/20/20   Bradd Canary, MD  Probiotic Product (PROBIOTIC-10 PO) Take by mouth.    [provider]  triamcinolone (KENALOG) 0.025 % ointment Apply 1 application topically 2 (two) times daily as needed. 01/22/19   Bradd Canary, MD  Vitamin D, Ergocalciferol, (DRISDOL) 1.25 MG (50000 UNIT) CAPS capsule Take 1 capsule (50,000 Units total) by mouth every 7 (seven) days. 09/01/20   Roswell Nickel, DO    Family History Family History  Problem Relation Age of Onset  . Fibromyalgia Mother   . Diabetes Mother        type 2  . Hypertension Mother   . Thyroid disease Mother   . Diabetes Father        type 2  . Heart disease Father 60       2 MI  . Hypertension Father   . Heart attack Father 23       X 2  . Stroke Father        mini strokes  . GER disease Father   . Hyperlipidemia Father   . Cholelithiasis Father   . Venous thrombosis Father        in liver  . Colon polyps Father   . Sleep apnea Father   . Obesity Father   . Hypertension Brother   . Venous thrombosis Brother        on spine  . Cancer Maternal Grandfather        stomach  . Hypertension Paternal Grandmother   . Diabetes Paternal Grandmother        type 2  . Heart attack Paternal Grandmother   . Heart disease Paternal Grandmother   . Heart attack Paternal Grandfather   . Heart disease Paternal Grandfather   . Hypertension Paternal Grandfather   . Cancer Maternal Uncle        prostate cancer    Social History Social History   Tobacco Use  . Smoking status: Never Smoker  . Smokeless tobacco: Never Used  Vaping Use  . Vaping Use: Never used  Substance Use Topics  . Alcohol use:  Yes    Comment: OCCASIONALLY  . Drug use: No     Allergies  Skelaxin   Review of Systems As per HPI   Physical Exam Triage Vital Signs ED Triage Vitals  Enc Vitals Group     BP      Pulse      Resp      Temp      Temp src      SpO2      Weight      Height      Head Circumference      Peak Flow      Pain Score      Pain Loc      Pain Edu?      Excl. in GC?    No data found.  Updated Vital Signs BP (!) 148/67 (BP Location: Left Arm)   Pulse 72   Temp 98.3 F (36.8 C) (Oral)   Resp 18   LMP 09/14/2020   SpO2 98%   Visual Acuity Right Eye Distance:   Left Eye Distance:   Bilateral Distance:    Right Eye Near:   Left Eye Near:    Bilateral Near:     Physical Exam Constitutional:      General: She is not in acute distress. HENT:     Head: Normocephalic and atraumatic.  Eyes:     General: No scleral icterus.       Right eye: No discharge.        Left eye: No discharge.     Extraocular Movements: Extraocular movements intact.     Conjunctiva/sclera: Conjunctivae normal.     Pupils: Pupils are equal, round, and reactive to light.     Comments: No foreign body with lid eversion bilaterally. Right upper eyelid with stye that does not have punctate or discharge  Cardiovascular:     Rate and Rhythm: Normal rate.  Pulmonary:     Effort: Pulmonary effort is normal.  Skin:    Coloration: Skin is not jaundiced or pale.  Neurological:     Mental Status: She is alert and oriented to person, place, and time.      UC Treatments / Results  Labs (all labs ordered are listed, but only abnormal results are displayed) Labs Reviewed - No data to display  EKG   Radiology No results found.  Procedures Procedures (including critical care time)  Medications Ordered in UC Medications - No data to display  Initial Impression / Assessment and Plan / UC Course  I have reviewed the triage vital signs and the nursing notes.  Pertinent labs & imaging  results that were available during my care of the patient were reviewed by me and considered in my medical decision making (see chart for details).     Small stye noted to right upper eyelid without associated conjunctivitis.  Reviewed supportive care as below.  Return precautions discussed, pt verbalized understanding and is agreeable to plan. Final Clinical Impressions(s) / UC Diagnoses   Final diagnoses:  Hordeolum externum of right upper eyelid     Discharge Instructions     Keep area(s) clean and dry. Apply hot compress / towel for 5-10 minutes 3-5 times daily. Return for worsening pain, redness, swelling, discharge, fever.    ED Prescriptions    None     PDMP not reviewed this encounter.   Hall[place]Potvin, [place], [place] 10/03/20 [phone]

## 2020-10-10 ENCOUNTER — Other Ambulatory Visit: Payer: Self-pay | Admitting: Family Medicine

## 2020-10-14 ENCOUNTER — Other Ambulatory Visit (INDEPENDENT_AMBULATORY_CARE_PROVIDER_SITE_OTHER): Payer: Self-pay | Admitting: Bariatrics

## 2020-10-14 DIAGNOSIS — E559 Vitamin D deficiency, unspecified: Secondary | ICD-10-CM

## 2020-10-14 NOTE — Telephone Encounter (Signed)
Last seen by Dr. Brown. 

## 2020-10-15 ENCOUNTER — Encounter (INDEPENDENT_AMBULATORY_CARE_PROVIDER_SITE_OTHER): Payer: Self-pay | Admitting: Bariatrics

## 2020-10-15 ENCOUNTER — Other Ambulatory Visit: Payer: Self-pay

## 2020-10-15 ENCOUNTER — Ambulatory Visit (INDEPENDENT_AMBULATORY_CARE_PROVIDER_SITE_OTHER): Payer: 59 | Admitting: Bariatrics

## 2020-10-15 VITALS — BP 105/65 | HR 71 | Temp 98.3°F | Ht 67.0 in | Wt 368.0 lb

## 2020-10-15 DIAGNOSIS — Z6841 Body Mass Index (BMI) 40.0 and over, adult: Secondary | ICD-10-CM

## 2020-10-15 DIAGNOSIS — I10 Essential (primary) hypertension: Secondary | ICD-10-CM

## 2020-10-15 DIAGNOSIS — E559 Vitamin D deficiency, unspecified: Secondary | ICD-10-CM | POA: Diagnosis not present

## 2020-10-15 NOTE — Progress Notes (Signed)
Chief Complaint:   OBESITY Christina Mendez is here to discuss her progress with her obesity treatment plan along with follow-up of her obesity related diagnoses. [name] is on the Category 4 Plan and states she is following her eating plan approximately 70% of the time. Christina Mendez states she is walking 20-30 minutes 7 times per week.  Today's visit was #: 7 Starting weight: 376 lbs Starting date: 06/10/2020 Today's weight: 368 lbs Today's date: 10/15/2020 Total lbs lost to date: 8 Total lbs lost since last in-office visit: 4  Interim History: Christina Mendez is down 4 lbs and doing well. She is cooking more at home.  Subjective:   Essential hypertension. Christina Mendez is taking Cozaar and Toprol XL. Blood pressure is controlled.  BP Readings from Last 3 Encounters:  10/15/20 105/65  10/03/20 (!) 148/67  09/22/20 116/74   Lab Results  Component Value Date   CREATININE 0.82 09/14/2020   CREATININE 0.86 06/24/2020   CREATININE 0.89 06/10/2020   Vitamin D deficiency. No nausea, vomiting, or muscle weakness.    Ref. Range 09/14/2020 09:34  Vitamin D, 25-Hydroxy Latest Ref Range: 30 - 100 ng/mL 39   Assessment/Plan:   Essential hypertension. Conception is working on healthy weight loss and exercise to improve blood pressure control. We will watch for signs of hypotension as she continues her lifestyle modifications. She will continue her medications as directed.   Vitamin D deficiency. Low Vitamin D level contributes to fatigue and are associated with obesity, breast, and colon cancer. She agrees to continue to take Vitamin D as directed and will follow-up for routine testing of Vitamin D, at least 2-3 times per year to avoid over-replacement.  Class 3 severe obesity with serious comorbidity and body mass index (BMI) of 50.0 to 59.9 in adult, unspecified obesity type (HCC).  Christina Mendez is currently in the action stage of change. As such, her goal is to continue with weight loss efforts.  She has agreed to the Category 4 Plan.   She will work on meal planning, intentional eating, and continuing to adhere closely to the plan.  We discussed triggers for emotional eating.  Exercise goals: Christina Mendez will continue walking 20-30 minutes 7 times per week.  Behavioral modification strategies: increasing lean protein intake, decreasing simple carbohydrates, increasing vegetables, increasing water intake, decreasing eating out, no skipping meals, meal planning and cooking strategies, keeping healthy foods in the home and planning for success.  Christina Mendez has agreed to follow-up with our clinic in 2-3 weeks. She was informed of the importance of frequent follow-up visits to maximize her success with intensive lifestyle modifications for her multiple health conditions.   Objective:   Blood pressure 105/65, pulse 71, temperature 98.3 F (36.8 C), temperature source Oral, height 5\' 7"  (1.702 m), weight (!) 368 lb (166.9 kg), last menstrual period 10/11/2020, SpO2 98 %. Body mass index is 57.64 kg/m.  General: Cooperative, alert, well developed, in no acute distress. HEENT: Conjunctivae and lids unremarkable. Cardiovascular: Regular rhythm.  Lungs: Normal work of breathing. Neurologic: No focal deficits.   Lab Results  Component Value Date   CREATININE 0.82 09/14/2020   BUN 14 09/14/2020   NA 139 09/14/2020   K 4.2 09/14/2020   CL 104 09/14/2020   CO2 27 09/14/2020   Lab Results  Component Value Date   ALT 19 09/14/2020   AST 19 09/14/2020   ALKPHOS 57 06/24/2020   BILITOT 0.5 09/14/2020   Lab Results  Component Value Date   HGBA1C 5.6  09/14/2020   HGBA1C 5.6 06/10/2020   Lab Results  Component Value Date   INSULIN 35.9 (H) 06/10/2020   Lab Results  Component Value Date   TSH 0.47 09/14/2020   Lab Results  Component Value Date   CHOL 180 09/14/2020   HDL 54 09/14/2020   LDLCALC 109 (H) 09/14/2020   TRIG 77 09/14/2020   CHOLHDL 3.3 09/14/2020   Lab Results    Component Value Date   WBC 4.4 09/14/2020   HGB 13.5 09/14/2020   HCT 40.5 09/14/2020   MCV 88.6 09/14/2020   PLT 259 09/14/2020   No results found for: IRON, TIBC, FERRITIN  Attestation Statements:   Reviewed by clinician on day of visit: allergies, medications, problem list, medical history, surgical history, family history, social history, and previous encounter notes.  Time spent on visit including pre-visit chart review and post-visit charting and care was 20 minutes.   Fernanda Drum, am acting as Energy manager for [employer], DO   I have reviewed the above documentation for accuracy and completeness, and I agree with the above. -  Corinna Capra

## 2020-11-03 ENCOUNTER — Other Ambulatory Visit: Payer: Self-pay

## 2020-11-03 ENCOUNTER — Ambulatory Visit (INDEPENDENT_AMBULATORY_CARE_PROVIDER_SITE_OTHER): Payer: 59 | Admitting: Bariatrics

## 2020-11-03 ENCOUNTER — Encounter (INDEPENDENT_AMBULATORY_CARE_PROVIDER_SITE_OTHER): Payer: Self-pay | Admitting: Bariatrics

## 2020-11-03 VITALS — BP 138/83 | HR 70 | Temp 97.9°F | Ht 67.0 in | Wt 372.0 lb

## 2020-11-03 DIAGNOSIS — E559 Vitamin D deficiency, unspecified: Secondary | ICD-10-CM

## 2020-11-03 DIAGNOSIS — Z6841 Body Mass Index (BMI) 40.0 and over, adult: Secondary | ICD-10-CM

## 2020-11-03 DIAGNOSIS — I10 Essential (primary) hypertension: Secondary | ICD-10-CM | POA: Diagnosis not present

## 2020-11-03 DIAGNOSIS — Z9189 Other specified personal risk factors, not elsewhere classified: Secondary | ICD-10-CM | POA: Diagnosis not present

## 2020-11-03 DIAGNOSIS — K219 Gastro-esophageal reflux disease without esophagitis: Secondary | ICD-10-CM

## 2020-11-03 MED ORDER — VITAMIN D (ERGOCALCIFEROL) 1.25 MG (50000 UNIT) PO CAPS: 50000.0000 [IU] | ORAL_CAPSULE | ORAL | 0 refills | Status: DC

## 2020-11-04 ENCOUNTER — Ambulatory Visit (INDEPENDENT_AMBULATORY_CARE_PROVIDER_SITE_OTHER): Payer: 59 | Admitting: Bariatrics

## 2020-11-04 NOTE — Progress Notes (Signed)
Chief Complaint:   OBESITY Christina Mendez is here to discuss her progress with her obesity treatment plan along with follow-up of her obesity related diagnoses. Christina Mendez is on the Category 4 Plan and states she is following her eating plan approximately 60% of the time. Christina Mendez states she is walking 20-30 minutes 4-5 times per week.  Today's visit was #: 8 Starting weight: 376 lbs Starting date: 06/10/2020 Today's weight: 372 lbs Today's date: 11/03/2020 Total lbs lost to date: 4 Total lbs lost since last in-office visit: 0  Interim History: Christina Mendez is up 4 lbs. She has had to eat more due to busy schedule. She states she is still cooking at home.  Subjective:   Essential hypertension. Blood pressure is controlled.  BP Readings from Last 3 Encounters:  11/03/20 138/83  10/15/20 105/65  10/03/20 (!) 148/67   Lab Results  Component Value Date   CREATININE 0.82 09/14/2020   CREATININE 0.86 06/24/2020   CREATININE 0.89 06/10/2020   Gastroesophageal reflux disease, unspecified whether esophagitis present. Christina Mendez is taking Prilosec.  Vitamin D deficiency. Christina Mendez is taking Vitamin D supplementation.    Ref. Range 09/14/2020 09:34  Vitamin D, 25-Hydroxy Latest Ref Range: 30 - 100 ng/mL 39   At risk for heart disease. Christina Mendez is at a higher than average risk for cardiovascular disease due to hypertension.   Assessment/Plan:   Essential hypertension. Christina Mendez is working on healthy weight loss and exercise to improve blood pressure control. We will watch for signs of hypotension as she continues her lifestyle modifications. She will continue her medication as directed.   Gastroesophageal reflux disease, unspecified whether esophagitis present. Intensive lifestyle modifications are the first line treatment for this issue. We discussed several lifestyle modifications today and she will continue to work on diet, exercise and weight loss efforts. Orders and follow up as  documented in patient record. Christina Mendez will continue Prilosec as directed.   Counseling . If a person has gastroesophageal reflux disease (GERD), food and stomach acid move back up into the esophagus and cause symptoms or problems such as damage to the esophagus. . Anti-reflux measures include: raising the head of the bed, avoiding tight clothing or belts, avoiding eating late at night, not lying down shortly after mealtime, and achieving weight loss. . Avoid ASA, NSAID's, caffeine, alcohol, and tobacco.  . OTC Pepcid and/or Tums are often very helpful for as needed use.  Marland Kitchen However, for persisting chronic or daily symptoms, stronger medications like Omeprazole may be needed. . You may need to avoid foods and drinks such as: ? Coffee and tea (with or without caffeine). ? Drinks that contain alcohol. ? Energy drinks and sports drinks. ? Bubbly (carbonated) drinks or sodas. ? Chocolate and cocoa. ? Peppermint and mint flavorings. ? Garlic and onions. ? Horseradish. ? Spicy and acidic foods. These include peppers, chili powder, curry powder, vinegar, hot sauces, and BBQ sauce. ? Citrus fruit juices and citrus fruits, such as oranges, lemons, and limes. ? Tomato-based foods. These include red sauce, chili, salsa, and pizza with red sauce. ? Fried and fatty foods. These include donuts, french fries, potato chips, and high-fat dressings. ? High-fat meats. These include hot dogs, rib eye steak, sausage, ham, and bacon.  Vitamin D deficiency. Low Vitamin D level contributes to fatigue and are associated with obesity, breast, and colon cancer. She was given a prescription for Vitamin D, Ergocalciferol, (DRISDOL) 1.25 MG (50000 UNIT) CAPS capsule every week #4 with 0 refills and will  follow-up for routine testing of Vitamin D, at least 2-3 times per year to avoid over-replacement.   At risk for heart disease. Christina Mendez was given approximately 15 minutes of coronary artery disease prevention counseling  today. She is 44 y.o. female and has risk factors for heart disease including obesity. We discussed intensive lifestyle modifications today with an emphasis on specific weight loss instructions and strategies.   Repetitive spaced learning was employed today to elicit superior memory formation and behavioral change.  Class 3 severe obesity with serious comorbidity and body mass index (BMI) of 50.0 to 59.9 in adult, unspecified obesity type (HCC).  Christina Mendez is currently in the action stage of change. As such, her goal is to continue with weight loss efforts. She has agreed to the Category 4 Plan.   She will work on meal planning, intentional eating, and increasing her protein intake.   Exercise goals: All adults should avoid inactivity. Some physical activity is better than none, and adults who participate in any amount of physical activity gain some health benefits.  Behavioral modification strategies: increasing lean protein intake, decreasing simple carbohydrates, increasing vegetables, increasing water intake, decreasing eating out, no skipping meals, meal planning and cooking strategies and keeping healthy foods in the home.  Christina Mendez has agreed to follow-up with our clinic in 3 weeks. She was informed of the importance of frequent follow-up visits to maximize her success with intensive lifestyle modifications for her multiple health conditions.   Objective:   Blood pressure 138/83, pulse 70, temperature 97.9 F (36.6 C), height 5\' 7"  (1.702 m), weight (!) 372 lb (168.7 kg), last menstrual period 10/11/2020, SpO2 98 %. Body mass index is 58.26 kg/m.  General: Cooperative, alert, well developed, in no acute distress. HEENT: Conjunctivae and lids unremarkable. Cardiovascular: Regular rhythm.  Lungs: Normal work of breathing. Neurologic: No focal deficits.   Lab Results  Component Value Date   CREATININE 0.82 09/14/2020   BUN 14 09/14/2020   NA 139 09/14/2020   K 4.2 09/14/2020    CL 104 09/14/2020   CO2 27 09/14/2020   Lab Results  Component Value Date   ALT 19 09/14/2020   AST 19 09/14/2020   ALKPHOS 57 06/24/2020   BILITOT 0.5 09/14/2020   Lab Results  Component Value Date   HGBA1C 5.6 09/14/2020   HGBA1C 5.6 06/10/2020   Lab Results  Component Value Date   INSULIN 35.9 (H) 06/10/2020   Lab Results  Component Value Date   TSH 0.47 09/14/2020   Lab Results  Component Value Date   CHOL 180 09/14/2020   HDL 54 09/14/2020   LDLCALC 109 (H) 09/14/2020   TRIG 77 09/14/2020   CHOLHDL 3.3 09/14/2020   Lab Results  Component Value Date   WBC 4.4 09/14/2020   HGB 13.5 09/14/2020   HCT 40.5 09/14/2020   MCV 88.6 09/14/2020   PLT 259 09/14/2020   No results found for: IRON, TIBC, FERRITIN  Attestation Statements:   Reviewed by clinician on day of visit: allergies, medications, problem list, medical history, surgical history, family history, social history, and previous encounter notes.  11/14/2020, am acting as Fernanda Drum for Energy manager, DO   I have reviewed the above documentation for accuracy and completeness, and I agree with the above. [employer], DO

## 2020-11-09 ENCOUNTER — Encounter (INDEPENDENT_AMBULATORY_CARE_PROVIDER_SITE_OTHER): Payer: Self-pay | Admitting: Bariatrics

## 2020-11-25 ENCOUNTER — Ambulatory Visit (INDEPENDENT_AMBULATORY_CARE_PROVIDER_SITE_OTHER): Payer: 59 | Admitting: Bariatrics

## 2020-11-25 ENCOUNTER — Telehealth: Payer: Self-pay | Admitting: Obstetrics & Gynecology

## 2020-11-25 ENCOUNTER — Other Ambulatory Visit: Payer: Self-pay | Admitting: Obstetrics & Gynecology

## 2020-11-25 DIAGNOSIS — N939 Abnormal uterine and vaginal bleeding, unspecified: Secondary | ICD-10-CM

## 2020-11-25 MED ORDER — NORGESTIM-ETH ESTRAD TRIPHASIC 0.18/0.215/0.25 MG-35 MCG PO TABS: 1.0000 | ORAL_TABLET | Freq: Every day | ORAL | 11 refills | Status: DC

## 2020-11-25 NOTE — Telephone Encounter (Signed)
TC pt to pt. She was not able to hear me. Will change her OCPs to Ortho-tricyclen per her Epic Mychart request.   Eber Jones L. Harraway-Smith, M.D., Evern Core

## 2020-11-27 ENCOUNTER — Ambulatory Visit: Payer: 59 | Admitting: Obstetrics & Gynecology

## 2020-12-03 ENCOUNTER — Other Ambulatory Visit (INDEPENDENT_AMBULATORY_CARE_PROVIDER_SITE_OTHER): Payer: Self-pay | Admitting: Bariatrics

## 2020-12-03 DIAGNOSIS — E559 Vitamin D deficiency, unspecified: Secondary | ICD-10-CM

## 2020-12-17 ENCOUNTER — Other Ambulatory Visit: Payer: Self-pay | Admitting: Family Medicine

## 2020-12-17 DIAGNOSIS — R600 Localized edema: Secondary | ICD-10-CM

## 2021-01-01 ENCOUNTER — Other Ambulatory Visit: Payer: Self-pay

## 2021-01-04 ENCOUNTER — Other Ambulatory Visit: Payer: Self-pay

## 2021-01-04 ENCOUNTER — Ambulatory Visit (INDEPENDENT_AMBULATORY_CARE_PROVIDER_SITE_OTHER): Payer: 59 | Admitting: Family Medicine

## 2021-01-04 ENCOUNTER — Encounter: Payer: Self-pay | Admitting: Family Medicine

## 2021-01-04 DIAGNOSIS — E8881 Metabolic syndrome: Secondary | ICD-10-CM | POA: Diagnosis not present

## 2021-01-04 DIAGNOSIS — E782 Mixed hyperlipidemia: Secondary | ICD-10-CM

## 2021-01-04 DIAGNOSIS — N920 Excessive and frequent menstruation with regular cycle: Secondary | ICD-10-CM

## 2021-01-04 DIAGNOSIS — E559 Vitamin D deficiency, unspecified: Secondary | ICD-10-CM | POA: Diagnosis not present

## 2021-01-04 DIAGNOSIS — I10 Essential (primary) hypertension: Secondary | ICD-10-CM

## 2021-01-04 NOTE — Assessment & Plan Note (Signed)
Doing much better on current meds, is following with OB/GYN now

## 2021-01-04 NOTE — Progress Notes (Signed)
Subjective:    Patient ID: Christina Mendez, female    DOB: 05-Oct-1976, 45 y.o.   MRN: [id number]  Chief Complaint  Patient presents with  . Follow-up    HPI Patient is in today for follow up on chronic medical conditions. She is feeling well today. No recent febrile illness or hospitalizations. She has been staying busy and trying to stay safe. Has been distancing and masking. She tolerated her COVID shots. No polyuria or polydipsia. Denies CP/palp/SOB/HA/congestion/fevers/GI or GU c/o. Taking meds as prescribed  Past Medical History:  Diagnosis Date  . Acid reflux   . Allergic state 03/02/2012  . Allergy last couple of years   seasonal  . Anxiety   . Back pain   . Cervical cancer screening 03/02/2012  . Chest pain   . Chicken pox 3rd grade  . GERD (gastroesophageal reflux disease)   . GERD (gastroesophageal reflux disease)   . Heartburn   . HTN (hypertension) 12/09/2011  . Hyperlipidemia   . Hypertension   . Inflamed skin tag 03/02/2012  . Menorrhagia 05/04/2015  . OAB (overactive bladder)   . Obese 12/09/2011  . Obesity   . OSA (obstructive sleep apnea)   . Ovarian cyst 08/17/2012  . Palpitations   . Pedal edema 07/11/2017  . Sinusitis 01/08/2012  . Swollen ankles   . Swollen feet   . Vaginal Pap smear, abnormal     Past Surgical History:  Procedure Laterality Date  . cyst removed  45 yr old   benign, abdominal  . HERNIA REPAIR      Family History  Problem Relation Age of Onset  . Fibromyalgia Mother   . Diabetes Mother        type 2  . Hypertension Mother   . Thyroid disease Mother   . Diabetes Father        type 2  . Heart disease Father 70       2 MI  . Hypertension Father   . Heart attack Father 23       X 2  . Stroke Father        mini strokes  . GER disease Father   . Hyperlipidemia Father   . Cholelithiasis Father   . Venous thrombosis Father        in liver  . Colon polyps Father   . Sleep apnea Father   . Obesity Father   .  Hypertension Brother   . Venous thrombosis Brother        on spine  . Cancer Maternal Grandfather        stomach  . Hypertension Paternal Grandmother   . Diabetes Paternal Grandmother        type 2  . Heart attack Paternal Grandmother   . Heart disease Paternal Grandmother   . Heart attack Paternal Grandfather   . Heart disease Paternal Grandfather   . Hypertension Paternal Grandfather   . Cancer Maternal Uncle        prostate cancer    Social History   Socioeconomic History  . Marital status: Single    Spouse name: Not on file  . Number of children: Not on file  . Years of education: Not on file  . Highest education level: Bachelor's degree (e.g., BA, AB, BS)  Occupational History  . Occupation: Data processing manager  Tobacco Use  . Smoking status: Never Smoker  . Smokeless tobacco: Never Used  Vaping Use  . Vaping Use: Never used  Substance  and Sexual Activity  . Alcohol use: Yes    Comment: OCCASIONALLY  . Drug use: No  . Sexual activity: Yes    Partners: Male    Comment: No dietary restrictions, lives with Mom, Dad and brother and niece.  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 Not on file  Social History Narrative  . Not on file   Social Determinants of Health   Financial Resource Strain: Not on file  Food Insecurity: Not on file  Transportation Needs: Not on file  Physical Activity: Not on file  Stress: Not on file  Social Connections: Not on file  Intimate Partner Violence: Not on file    Outpatient Medications Prior to Visit  Medication Sig Dispense Refill  . acetaminophen (TYLENOL) 500 MG tablet Take 500 mg by mouth every 6 (six) hours as needed. 2-3 tabs po qd prn    . aspirin EC 81 MG tablet Take 81 mg by mouth daily. Swallow whole.    . cholecalciferol (VITAMIN D) 25 MCG (1000 UNIT) tablet Take 1,000 Units by mouth daily.    . [employer] Omega-3 Krill Oil 500 MG CAPS Take by mouth.    . cyclobenzaprine (FLEXERIL) 10 MG tablet Take 1 tablet  (10 mg total) by mouth 3 (three) times daily as needed for muscle spasms. 90 tablet 2  . fluticasone (FLONASE) 50 MCG/ACT nasal spray Place into both nostrils daily.    . furosemide (LASIX) 20 MG tablet Take 2 tablets (40 mg total) by mouth 2 (two) times daily as needed for edema. 60 tablet 0  . HYDROcodone-acetaminophen (NORCO/VICODIN) 5-325 MG tablet Take 1 tablet by mouth 3 (three) times daily as needed for moderate pain. 30 tablet 0  . losartan (COZAAR) 25 MG tablet TAKE ONE TABLET BY MOUTH ONE TIME DAILY 90 tablet 1  . metoprolol succinate (TOPROL-XL) 25 MG 24 hr tablet TAKE ONE TABLET BY MOUTH ONE TIME DAILY 90 tablet 1  . Multiple Vitamins-Minerals (WOMENS MULTI VITAMIN & MINERAL PO) Take by mouth.    . Norgestimate-Ethinyl Estradiol Triphasic (ORTHO TRI-CYCLEN, 28,) 0.18/0.215/0.25 MG-35 MCG tablet Take 1 tablet by mouth daily. 28 tablet 11  . omeprazole (PRILOSEC) 40 MG capsule TAKE ONE CAPSULE BY MOUTH ONE TIME DAILY 90 capsule 0  . Probiotic Product (PROBIOTIC-10 PO) Take by mouth.    . Vitamin D, Ergocalciferol, (DRISDOL) 1.25 MG (50000 UNIT) CAPS capsule Take 1 capsule (50,000 Units total) by mouth every 7 (seven) days. 4 capsule 0  . Loratadine 10 MG CAPS Take by mouth.    . triamcinolone (KENALOG) 0.025 % ointment Apply 1 application topically 2 (two) times daily as needed. 30 g 1   No facility-administered medications prior to visit.    Allergies  Allergen Reactions  . Skelaxin Hypertension    Review of Systems  Constitutional: Negative for fever and malaise/fatigue.  HENT: Negative for congestion.   Eyes: Negative for blurred vision.  Respiratory: Negative for shortness of breath.   Cardiovascular: Negative for chest pain, palpitations and leg swelling.  Gastrointestinal: Negative for abdominal pain, blood in stool and nausea.  Genitourinary: Negative for dysuria and frequency.  Musculoskeletal: Negative for falls.  Skin: Negative for rash.  Neurological: Negative for  dizziness, loss of consciousness and headaches.  Endo/Heme/Allergies: Negative for environmental allergies.  Psychiatric/Behavioral: Negative for depression. The patient is not nervous/anxious.        Objective:    Physical Exam Vitals and nursing note reviewed.  Constitutional:  General: She is not in acute distress.    Appearance: She is well-developed and well-nourished.  HENT:     Head: Normocephalic and atraumatic.     Nose: Nose normal.  Eyes:     General:        Right eye: No discharge.        Left eye: No discharge.  Cardiovascular:     Rate and Rhythm: Normal rate and regular rhythm.     Heart sounds: No murmur heard.   Pulmonary:     Effort: Pulmonary effort is normal.     Breath sounds: Normal breath sounds.  Abdominal:     General: Bowel sounds are normal.     Palpations: Abdomen is soft.     Tenderness: There is no abdominal tenderness.  Musculoskeletal:        General: No edema.     Cervical back: Normal range of motion and neck supple.  Skin:    General: Skin is warm and dry.  Neurological:     Mental Status: She is alert and oriented to person, place, and time.  Psychiatric:        Mood and Affect: Mood and affect normal.     BP 126/84   Pulse 99   Temp 98.7 F (37.1 C) (Oral)   Resp 18   Wt (!) 368 lb (166.9 kg)   SpO2 99%   BMI 57.64 kg/m  Wt Readings from Last 3 Encounters:  01/04/21 (!) 368 lb (166.9 kg)  11/03/20 (!) 372 lb (168.7 kg)  10/15/20 (!) 368 lb (166.9 kg)    Diabetic Foot Exam - Simple   No data filed    Lab Results  Component Value Date   WBC 4.4 09/14/2020   HGB 13.5 09/14/2020   HCT 40.5 09/14/2020   PLT 259 09/14/2020   GLUCOSE 95 09/14/2020   CHOL 180 09/14/2020   TRIG 77 09/14/2020   HDL 54 09/14/2020   LDLCALC 109 (H) 09/14/2020   ALT 19 09/14/2020   AST 19 09/14/2020   NA 139 09/14/2020   K 4.2 09/14/2020   CL 104 09/14/2020   CREATININE 0.82 09/14/2020   BUN 14 09/14/2020   CO2 27 09/14/2020    TSH 0.47 09/14/2020   HGBA1C 5.6 09/14/2020    Lab Results  Component Value Date   TSH 0.47 09/14/2020   Lab Results  Component Value Date   WBC 4.4 09/14/2020   HGB 13.5 09/14/2020   HCT 40.5 09/14/2020   MCV 88.6 09/14/2020   PLT 259 09/14/2020   Lab Results  Component Value Date   NA 139 09/14/2020   K 4.2 09/14/2020   CO2 27 09/14/2020   GLUCOSE 95 09/14/2020   BUN 14 09/14/2020   CREATININE 0.82 09/14/2020   BILITOT 0.5 09/14/2020   ALKPHOS 57 06/24/2020   AST 19 09/14/2020   ALT 19 09/14/2020   PROT 6.9 09/14/2020   ALBUMIN 4.4 06/24/2020   CALCIUM 9.2 09/14/2020   GFR 86.16 06/24/2020   Lab Results  Component Value Date   CHOL 180 09/14/2020   Lab Results  Component Value Date   HDL 54 09/14/2020   Lab Results  Component Value Date   LDLCALC 109 (H) 09/14/2020   Lab Results  Component Value Date   TRIG 77 09/14/2020   Lab Results  Component Value Date   CHOLHDL 3.3 09/14/2020   Lab Results  Component Value Date   HGBA1C 5.6 09/14/2020  Assessment & Plan:   Problem List Items Addressed This Visit    HTN (hypertension)    Well controlled, no changes to meds. Encouraged heart healthy diet such as the DASH diet and exercise as tolerated.       Morbid obesity (HCC)    Following with [facility]. She has lost about 10 # and is feeling some better. It was her birthday this week so she has not done great but is committed to restarting       Hyperlipidemia, mixed    Encouraged heart healthy diet, increase exercise, avoid trans fats, consider a krill oil cap daily      Menorrhagia    Doing much better on current meds, is following with OB/GYN now      Insulin resistance    hgba1c acceptable, minimize simple carbs. Increase exercise as tolerated.      Vitamin D deficiency    Supplement and monitor         I have discontinued Jaxyn L. Gunby's triamcinolone and Loratadine. I am also having her maintain her cyclobenzaprine,  aspirin EC, acetaminophen, cholecalciferol, Multiple Vitamins-Minerals (WOMENS MULTI VITAMIN & MINERAL PO), [employer] Omega-3 Krill Oil, fluticasone, Probiotic Product (PROBIOTIC-10 PO), metoprolol succinate, losartan, HYDROcodone-acetaminophen, omeprazole, Vitamin D (Ergocalciferol), Norgestimate-Ethinyl Estradiol Triphasic, and furosemide.  No orders of the defined types were placed in this encounter.    Danise EdgeStacey Emiliano Welshans, MD

## 2021-01-04 NOTE — Assessment & Plan Note (Signed)
Following with [facility]. She has lost about 10 # and is feeling some better. It was her birthday this week so she has not done great but is committed to restarting

## 2021-01-04 NOTE — Assessment & Plan Note (Signed)
hgba1c acceptable, minimize simple carbs. Increase exercise as tolerated.  

## 2021-01-04 NOTE — Patient Instructions (Signed)

## 2021-01-04 NOTE — Assessment & Plan Note (Signed)
Supplement and monitor 

## 2021-01-04 NOTE — Assessment & Plan Note (Signed)
Encouraged heart healthy diet, increase exercise, avoid trans fats, consider a krill oil cap daily 

## 2021-01-04 NOTE — Assessment & Plan Note (Signed)
Well controlled, no changes to meds. Encouraged heart healthy diet such as the DASH diet and exercise as tolerated.  °

## 2021-01-05 ENCOUNTER — Encounter (INDEPENDENT_AMBULATORY_CARE_PROVIDER_SITE_OTHER): Payer: Self-pay | Admitting: Bariatrics

## 2021-01-05 ENCOUNTER — Ambulatory Visit (INDEPENDENT_AMBULATORY_CARE_PROVIDER_SITE_OTHER): Payer: 59 | Admitting: Bariatrics

## 2021-01-05 VITALS — BP 122/68 | HR 80 | Temp 98.5°F | Ht 67.0 in | Wt 360.0 lb

## 2021-01-05 DIAGNOSIS — Z6841 Body Mass Index (BMI) 40.0 and over, adult: Secondary | ICD-10-CM

## 2021-01-05 DIAGNOSIS — E8881 Metabolic syndrome: Secondary | ICD-10-CM

## 2021-01-05 DIAGNOSIS — I10 Essential (primary) hypertension: Secondary | ICD-10-CM

## 2021-01-05 DIAGNOSIS — E88819 Insulin resistance, unspecified: Secondary | ICD-10-CM

## 2021-01-06 ENCOUNTER — Encounter (INDEPENDENT_AMBULATORY_CARE_PROVIDER_SITE_OTHER): Payer: Self-pay | Admitting: Bariatrics

## 2021-01-06 NOTE — Progress Notes (Signed)
Chief Complaint:   OBESITY Christina Mendez is here to discuss her progress with her obesity treatment plan along with follow-up of her obesity related diagnoses. Christina Mendez is on the Category 3 Plan and states she is following her eating plan approximately 60% of the time. Christina Mendez states she is water aerobics for 60+ minutes 4-5 times per week.  Today's visit was #: 9 Starting weight: 376 lbs Starting date: 06/10/2020 Today's weight: 360 lbs Today's date: 01/05/2021 Total lbs lost to date: 16 lbs Total lbs lost since last in-office visit: 12 lbs  Interim History: Christina Mendez is down 12 pounds since her last visit in November 2021.  She has stayed on the plan.  Subjective:   1. Essential hypertension Controlled.  Review: taking medications as instructed, no medication side effects noted, no chest pain on exertion, no dyspnea on exertion, no swelling of ankles.  She is taking her medications as prescribed.  BP Readings from Last 3 Encounters:  01/05/21 122/68  01/04/21 126/84  11/03/20 138/83   2. Insulin resistance Christina Mendez has a diagnosis of insulin resistance based on her elevated fasting insulin level >5. She continues to work on diet and exercise to decrease her risk of diabetes.  No medication.  Normal appetite.  Lab Results  Component Value Date   INSULIN 35.9 (H) 06/10/2020   Lab Results  Component Value Date   HGBA1C 5.6 09/14/2020   Assessment/Plan:   1. Essential hypertension Christina Mendez is working on healthy weight loss and exercise to improve blood pressure control. We will watch for signs of hypotension as she continues her lifestyle modifications.  Continue medication.  2. Insulin resistance Christina Mendez will continue to work on weight loss, exercise, and decreasing simple carbohydrates to help decrease the risk of diabetes. Christina Mendez agreed to follow-up with [place] as directed to closely monitor her progress.  Decrease carbohydrates.  Increase activities and exercise.  3.  Class 3 severe obesity with serious comorbidity and body mass index (BMI) of 50.0 to 59.9 in adult, unspecified obesity type (HCC)  Christina Mendez is currently in the action stage of change. As such, her goal is to continue with weight loss efforts. She has agreed to the Category 3 Plan.   She will work on remaining adherent to the plan and meal planning.  Recipes II handout was provided today.  Exercise goals: Treadmill at home.  Behavioral modification strategies: increasing lean protein intake, decreasing simple carbohydrates, increasing vegetables, increasing water intake, decreasing eating out, no skipping meals, meal planning and cooking strategies, keeping healthy foods in the home and planning for success.  Christina Mendez has agreed to follow-up with our clinic in 4 weeks, fasting. She was informed of the importance of frequent follow-up visits to maximize her success with intensive lifestyle modifications for her multiple health conditions.   Objective:   Blood pressure 122/68, pulse 80, temperature 98.5 F (36.9 C), height 5\' 7"  (1.702 m), weight (!) 360 lb (163.3 kg), last menstrual period 12/29/2020, SpO2 96 %. Body mass index is 56.38 kg/m.  General: Cooperative, alert, well developed, in no acute distress. HEENT: Conjunctivae and lids unremarkable. Cardiovascular: Regular rhythm.  Lungs: Normal work of breathing. Neurologic: No focal deficits.   Lab Results  Component Value Date   CREATININE 0.82 09/14/2020   BUN 14 09/14/2020   NA 139 09/14/2020   K 4.2 09/14/2020   CL 104 09/14/2020   CO2 27 09/14/2020   Lab Results  Component Value Date   ALT 19 09/14/2020   AST 19  09/14/2020   ALKPHOS 57 06/24/2020   BILITOT 0.5 09/14/2020   Lab Results  Component Value Date   HGBA1C 5.6 09/14/2020   HGBA1C 5.6 06/10/2020   Lab Results  Component Value Date   INSULIN 35.9 (H) 06/10/2020   Lab Results  Component Value Date   TSH 0.47 09/14/2020   Lab Results  Component  Value Date   CHOL 180 09/14/2020   HDL 54 09/14/2020   LDLCALC 109 (H) 09/14/2020   TRIG 77 09/14/2020   CHOLHDL 3.3 09/14/2020   Lab Results  Component Value Date   WBC 4.4 09/14/2020   HGB 13.5 09/14/2020   HCT 40.5 09/14/2020   MCV 88.6 09/14/2020   PLT 259 09/14/2020   Attestation Statements:   Reviewed by clinician on day of visit: allergies, medications, problem list, medical history, surgical history, family history, social history, and previous encounter notes.  Time spent on visit including pre-visit chart review and post-visit care and charting was 20 minutes.   I, Insurance claims handler, CMA, am acting as Energy manager for [employer], DO  I have reviewed the above documentation for accuracy and completeness, and I agree with the above. Corinna Capra, DO

## 2021-01-07 ENCOUNTER — Ambulatory Visit: Payer: 59 | Admitting: Family Medicine

## 2021-01-08 ENCOUNTER — Encounter: Payer: Self-pay | Admitting: Family Medicine

## 2021-01-08 MED ORDER — HYDROCODONE-ACETAMINOPHEN 5-325 MG PO TABS
1.0000 | ORAL_TABLET | Freq: Three times a day (TID) | ORAL | 0 refills | Status: DC | PRN
Start: 2021-01-08 — End: 2021-11-09

## 2021-01-08 NOTE — Telephone Encounter (Signed)
Requesting: hydrocodone 5-325mg  Contract: None UDS: was ordered 04/29/20 but not done? Last Visit: 01/04/2021 Next Visit: 07/05/2021 Last Refill: 09/15/2020 #30 and 0RF Pt sig: 1 tab tid prn  Please Advise

## 2021-01-18 ENCOUNTER — Other Ambulatory Visit: Payer: Self-pay | Admitting: Family Medicine

## 2021-01-18 DIAGNOSIS — R600 Localized edema: Secondary | ICD-10-CM

## 2021-01-22 ENCOUNTER — Ambulatory Visit (HOSPITAL_BASED_OUTPATIENT_CLINIC_OR_DEPARTMENT_OTHER): Admission: RE | Admit: 2021-01-22 | Payer: 59 | Source: Ambulatory Visit

## 2021-01-22 ENCOUNTER — Ambulatory Visit (HOSPITAL_BASED_OUTPATIENT_CLINIC_OR_DEPARTMENT_OTHER): Payer: 59

## 2021-01-25 ENCOUNTER — Ambulatory Visit: Payer: 59 | Admitting: Obstetrics & Gynecology

## 2021-02-02 ENCOUNTER — Other Ambulatory Visit: Payer: Self-pay

## 2021-02-02 ENCOUNTER — Ambulatory Visit (INDEPENDENT_AMBULATORY_CARE_PROVIDER_SITE_OTHER): Payer: 59 | Admitting: Bariatrics

## 2021-02-02 ENCOUNTER — Encounter (INDEPENDENT_AMBULATORY_CARE_PROVIDER_SITE_OTHER): Payer: Self-pay | Admitting: Bariatrics

## 2021-02-02 VITALS — BP 146/91 | HR 72 | Temp 97.9°F | Ht 67.0 in | Wt 370.0 lb

## 2021-02-02 DIAGNOSIS — E559 Vitamin D deficiency, unspecified: Secondary | ICD-10-CM

## 2021-02-02 DIAGNOSIS — E8881 Metabolic syndrome: Secondary | ICD-10-CM

## 2021-02-02 DIAGNOSIS — E88819 Insulin resistance, unspecified: Secondary | ICD-10-CM

## 2021-02-02 DIAGNOSIS — E66813 Obesity, class 3: Secondary | ICD-10-CM

## 2021-02-02 DIAGNOSIS — I10 Essential (primary) hypertension: Secondary | ICD-10-CM

## 2021-02-02 DIAGNOSIS — Z9189 Other specified personal risk factors, not elsewhere classified: Secondary | ICD-10-CM

## 2021-02-02 DIAGNOSIS — E782 Mixed hyperlipidemia: Secondary | ICD-10-CM

## 2021-02-02 DIAGNOSIS — Z6841 Body Mass Index (BMI) 40.0 and over, adult: Secondary | ICD-10-CM

## 2021-02-02 MED ORDER — VITAMIN D (ERGOCALCIFEROL) 1.25 MG (50000 UNIT) PO CAPS: 50000.0000 [IU] | ORAL_CAPSULE | ORAL | 0 refills | Status: DC

## 2021-02-03 ENCOUNTER — Ambulatory Visit (HOSPITAL_BASED_OUTPATIENT_CLINIC_OR_DEPARTMENT_OTHER): Payer: 59

## 2021-02-03 ENCOUNTER — Other Ambulatory Visit (HOSPITAL_BASED_OUTPATIENT_CLINIC_OR_DEPARTMENT_OTHER): Payer: 59

## 2021-02-03 ENCOUNTER — Encounter (INDEPENDENT_AMBULATORY_CARE_PROVIDER_SITE_OTHER): Payer: Self-pay | Admitting: Bariatrics

## 2021-02-03 LAB — COMPREHENSIVE METABOLIC PANEL
ALT: 15 IU/L (ref 0–32)
AST: 14 IU/L (ref 0–40)
Albumin/Globulin Ratio: 1.2 (ref 1.2–2.2)
Albumin: 3.5 g/dL — ABNORMAL LOW (ref 3.8–4.8)
Alkaline Phosphatase: 57 IU/L (ref 44–121)
BUN/Creatinine Ratio: 12 (ref 9–23)
BUN: 9 mg/dL (ref 6–24)
Bilirubin Total: 0.3 mg/dL (ref 0.0–1.2)
CO2: 22 mmol/L (ref 20–29)
Calcium: 8.3 mg/dL — ABNORMAL LOW (ref 8.7–10.2)
Chloride: 107 mmol/L — ABNORMAL HIGH (ref 96–106)
Creatinine, Ser: 0.73 mg/dL (ref 0.57–1.00)
GFR calc Af Amer: 114 mL/min/{1.73_m2} (ref >59)
GFR calc non Af Amer: 99 mL/min/{1.73_m2} (ref >59)
Globulin, Total: 2.9 g/dL (ref 1.5–4.5)
Glucose: 89 mg/dL (ref 65–99)
Potassium: 4.4 mmol/L (ref 3.5–5.2)
Sodium: 142 mmol/L (ref 134–144)
Total Protein: 6.4 g/dL (ref 6.0–8.5)

## 2021-02-03 LAB — HEMOGLOBIN A1C
Est. average glucose Bld gHb Est-mCnc: 117 mg/dL
Hgb A1c MFr Bld: 5.7 % — ABNORMAL HIGH (ref 4.8–5.6)

## 2021-02-03 LAB — VITAMIN D 25 HYDROXY (VIT D DEFICIENCY, FRACTURES): Vit D, 25-Hydroxy: 29.7 ng/mL — ABNORMAL LOW (ref 30.0–100.0)

## 2021-02-03 LAB — INSULIN, RANDOM: INSULIN: 15.4 u[IU]/mL (ref 2.6–24.9)

## 2021-02-03 LAB — LIPID PANEL WITH LDL/HDL RATIO
Cholesterol, Total: 174 mg/dL (ref 100–199)
HDL: 62 mg/dL (ref >39)
LDL Chol Calc (NIH): 94 mg/dL (ref 0–99)
LDL/HDL Ratio: 1.5 ratio (ref 0.0–3.2)
Triglycerides: 101 mg/dL (ref 0–149)
VLDL Cholesterol Cal: 18 mg/dL (ref 5–40)

## 2021-02-04 ENCOUNTER — Encounter (INDEPENDENT_AMBULATORY_CARE_PROVIDER_SITE_OTHER): Payer: Self-pay | Admitting: Bariatrics

## 2021-02-04 NOTE — Progress Notes (Signed)
Chief Complaint:   OBESITY Lemma is here to discuss her progress with her obesity treatment plan along with follow-up of her obesity related diagnoses. Christina Mendez is on the Category 3 Plan and states she is following her eating plan approximately 60% of the time. Christina Mendez states she is walking for 30 minutes 7 times per week [facility]  Today's visit was #: 10 Starting weight: 376 lbs Starting date: 06/10/2020 Today's weight: 370 lbs Today's date: 02/02/2021 Total lbs lost to date: 6 lbs Total lbs lost since last in-office visit: 0  Interim History: Christina Mendez is up 10 pounds since her last visit 1 month ago.  She thinks that she is retaining fluid (lasix was decreased 1 pill).  Subjective:   1. Essential hypertension Review: taking medications as instructed, no medication side effects noted, no chest pain on exertion, no dyspnea on exertion, no swelling of ankles.  Blood pressure is elevated today.  She says she did not take her medication.  BP Readings from Last 3 Encounters:  02/02/21 (!) 146/91  01/05/21 122/68  01/04/21 126/84   2. Insulin resistance Christina Mendez has a diagnosis of insulin resistance based on her elevated fasting insulin level >5. She continues to work on diet and exercise to decrease her risk of diabetes.    Lab Results  Component Value Date   INSULIN 15.4 02/02/2021   INSULIN 35.9 (H) 06/10/2020   Lab Results  Component Value Date   HGBA1C 5.7 (H) 02/02/2021   3. Hyperlipidemia, mixed Christina Mendez has hyperlipidemia and has been trying to improve her cholesterol levels with intensive lifestyle modification including a low saturated fat diet, exercise and weight loss. She denies any chest pain, claudication or myalgias.  Lab Results  Component Value Date   ALT 15 02/02/2021   AST 14 02/02/2021   ALKPHOS 57 02/02/2021   BILITOT 0.3 02/02/2021   Lab Results  Component Value Date   CHOL 174 02/02/2021   HDL  62 02/02/2021   LDLCALC 94 02/02/2021   TRIG 101 02/02/2021   CHOLHDL 3.3 09/14/2020   4. Vitamin D deficiency Christina Mendez's Vitamin D level was 39.0 on 09/14/2020. She is currently taking prescription vitamin D 50,000 IU each week. She denies nausea, vomiting or muscle weakness.  5. At risk for activity intolerance Christina Mendez is at risk for exercise intolerance due to obesity and the weather.  Assessment/Plan:   1. Essential hypertension Tahtiana is working on healthy weight loss and exercise to improve blood pressure control. We will watch for signs of hypotension as she continues her lifestyle modifications.  Will check CMP today.  She will take her medications consistently.  - Comprehensive metabolic panel  2. Insulin resistance Christina Mendez will continue to work on weight loss, exercise, and decreasing simple carbohydrates to help decrease the risk of diabetes. Christina Mendez agreed to follow-up with Christina Mendez as directed to closely monitor her progress.  Check A1c and insulin level.  - Hemoglobin A1c - Insulin, random - Hemoglobin A1c  3. Hyperlipidemia, mixed Cardiovascular risk and specific lipid/LDL goals reviewed.  We discussed several lifestyle modifications today and Christina Mendez will continue to work on diet, exercise and weight loss efforts. Orders and follow up as documented in patient record.  Check lipid panel.  Counseling Intensive lifestyle modifications are the first line treatment for this issue. . Dietary changes: Increase soluble fiber. Decrease simple carbohydrates. . Exercise changes: Moderate to vigorous-intensity aerobic activity 150 minutes per week if  tolerated. . Lipid-lowering medications: see documented in medical record.  - Lipid Panel With LDL/HDL Ratio  4. Vitamin D deficiency Low Vitamin D level contributes to fatigue and are associated with obesity, breast, and colon cancer. She agrees to continue to take prescription Vitamin D @50 ,000 IU every week and will check her  vitamin D level today.  - VITAMIN D 25 Hydroxy (Vit-D Deficiency, Fractures) - Refill Vitamin D, Ergocalciferol, (DRISDOL) 1.25 MG (50000 UNIT) CAPS capsule; Take 1 capsule (50,000 Units total) by mouth every 7 (seven) days.  Dispense: 4 capsule; Refill: 0  5. At risk for activity intolerance Christina Mendez was given approximately 15 minutes of exercise intolerance counseling today. She is 45 y.o. female and has risk factors exercise intolerance including obesity. We discussed intensive lifestyle modifications today with an emphasis on specific weight loss instructions and strategies. Christina Mendez will slowly increase activity as tolerated.  Repetitive spaced learning was employed today to elicit superior memory formation and behavioral change.  6. Class 3 severe obesity with serious comorbidity and body mass index (BMI) of 50.0 to 59.9 in adult, unspecified obesity type (HCC)  Christina Mendez is currently in the action stage of change. As such, her goal is to continue with weight loss efforts. She has agreed to the Category 3 Plan.   She will work on increasing her water intake and decreasing carbohydrates.  She will call her PCP to increase Lasix.  Exercise goals: As is.  Behavioral modification strategies: increasing lean protein intake, decreasing simple carbohydrates, increasing vegetables, increasing water intake, decreasing eating out, no skipping meals, meal planning and cooking strategies, keeping healthy foods in the home and planning for success.  Christina Mendez has agreed to follow-up with our clinic in 2 weeks. She was informed of the importance of frequent follow-up visits to maximize her success with intensive lifestyle modifications for her multiple health conditions.   Christina Mendez was informed we would discuss her lab results at her next visit unless there is a critical issue that needs to be addressed sooner. Christina Mendez agreed to keep her next visit at the agreed upon time to discuss these  results.  Objective:   Blood pressure (!) 146/91, pulse 72, temperature 97.9 F (36.6 C), temperature source Oral, height 5\' 7"  (1.702 m), weight (!) 370 lb (167.8 kg), last menstrual period 01/17/2021, SpO2 99 %. Body mass index is 57.95 kg/m.  General: Cooperative, alert, well developed, in no acute distress. HEENT: Conjunctivae and lids unremarkable. Cardiovascular: Regular rhythm.  Lungs: Normal work of breathing. Neurologic: No focal deficits.   Lab Results  Component Value Date   CREATININE 0.73 02/02/2021   BUN 9 02/02/2021   NA 142 02/02/2021   K 4.4 02/02/2021   CL 107 (H) 02/02/2021   CO2 22 02/02/2021   Lab Results  Component Value Date   ALT 15 02/02/2021   AST 14 02/02/2021   ALKPHOS 57 02/02/2021   BILITOT 0.3 02/02/2021   Lab Results  Component Value Date   HGBA1C 5.7 (H) 02/02/2021   HGBA1C 5.6 09/14/2020   HGBA1C 5.6 06/10/2020   Lab Results  Component Value Date   INSULIN 15.4 02/02/2021   INSULIN 35.9 (H) 06/10/2020   Lab Results  Component Value Date   TSH 0.47 09/14/2020   Lab Results  Component Value Date   CHOL 174 02/02/2021   HDL 62 02/02/2021   LDLCALC 94 02/02/2021   TRIG 101 02/02/2021   CHOLHDL 3.3 09/14/2020   Lab Results  Component Value Date   WBC  4.4 09/14/2020   HGB 13.5 09/14/2020   HCT 40.5 09/14/2020   MCV 88.6 09/14/2020   PLT 259 09/14/2020   Attestation Statements:   Reviewed by clinician on day of visit: allergies, medications, problem list, medical history, surgical history, family history, social history, and previous encounter notes.  I, Insurance claims handler, CMA, am acting as Energy manager for [employer], DO  I have reviewed the above documentation for accuracy and completeness, and I agree with the above. Corinna Capra, DO

## 2021-02-08 ENCOUNTER — Encounter: Payer: Self-pay | Admitting: Family Medicine

## 2021-02-08 ENCOUNTER — Other Ambulatory Visit: Payer: Self-pay | Admitting: Family Medicine

## 2021-02-08 DIAGNOSIS — R600 Localized edema: Secondary | ICD-10-CM

## 2021-02-08 MED ORDER — METOPROLOL SUCCINATE ER 25 MG PO TB24: 25.0000 mg | ORAL_TABLET | Freq: Every day | ORAL | 1 refills | Status: DC

## 2021-02-08 MED ORDER — FUROSEMIDE 20 MG PO TABS: 20.0000 mg | ORAL_TABLET | Freq: Every day | ORAL | 2 refills | Status: DC | PRN

## 2021-02-09 NOTE — Telephone Encounter (Signed)
Spoke with pt and she states that she is feeling better.

## 2021-02-13 ENCOUNTER — Emergency Department (HOSPITAL_BASED_OUTPATIENT_CLINIC_OR_DEPARTMENT_OTHER): Payer: 59

## 2021-02-13 ENCOUNTER — Encounter (HOSPITAL_BASED_OUTPATIENT_CLINIC_OR_DEPARTMENT_OTHER): Payer: Self-pay | Admitting: Emergency Medicine

## 2021-02-13 ENCOUNTER — Emergency Department (HOSPITAL_BASED_OUTPATIENT_CLINIC_OR_DEPARTMENT_OTHER)
Admission: EM | Admit: 2021-02-13 | Discharge: 2021-02-13 | Disposition: A | Discharge status: Home / Self Care | Payer: 59 | Attending: Emergency Medicine | Admitting: Emergency Medicine

## 2021-02-13 ENCOUNTER — Other Ambulatory Visit: Payer: Self-pay

## 2021-02-13 DIAGNOSIS — Z79899 Other long term (current) drug therapy: Secondary | ICD-10-CM | POA: Diagnosis not present

## 2021-02-13 DIAGNOSIS — Z7982 Long term (current) use of aspirin: Secondary | ICD-10-CM | POA: Insufficient documentation

## 2021-02-13 DIAGNOSIS — R12 Heartburn: Secondary | ICD-10-CM | POA: Insufficient documentation

## 2021-02-13 DIAGNOSIS — I10 Essential (primary) hypertension: Secondary | ICD-10-CM | POA: Insufficient documentation

## 2021-02-13 DIAGNOSIS — R072 Precordial pain: Secondary | ICD-10-CM | POA: Insufficient documentation

## 2021-02-13 LAB — CBC WITH DIFFERENTIAL/PLATELET
Abs Immature Granulocytes: 0 10*3/uL (ref 0.00–0.07)
Basophils Absolute: 0 10*3/uL (ref 0.0–0.1)
Basophils Relative: 0 %
Eosinophils Absolute: 0.1 10*3/uL (ref 0.0–0.5)
Eosinophils Relative: 2 %
HCT: 38 % (ref 36.0–46.0)
Hemoglobin: 12.7 g/dL (ref 12.0–15.0)
Lymphocytes Relative: 47 %
Lymphs Abs: 2.7 10*3/uL (ref 0.7–4.0)
MCH: 30 pg (ref 26.0–34.0)
MCHC: 33.4 g/dL (ref 30.0–36.0)
MCV: 89.6 fL (ref 80.0–100.0)
Monocytes Absolute: 0.7 10*3/uL (ref 0.1–1.0)
Monocytes Relative: 12 %
Neutro Abs: 2.3 10*3/uL (ref 1.7–7.7)
Neutrophils Relative %: 39 %
Platelets: 299 10*3/uL (ref 150–400)
RBC: 4.24 MIL/uL (ref 3.87–5.11)
RDW: 13.3 % (ref 11.5–15.5)
WBC: 5.8 10*3/uL (ref 4.0–10.5)
nRBC: 0 % (ref 0.0–0.2)

## 2021-02-13 LAB — BASIC METABOLIC PANEL
Anion gap: 8 (ref 5–15)
BUN: 11 mg/dL (ref 6–20)
CO2: 25 mmol/L (ref 22–32)
Calcium: 8.7 mg/dL — ABNORMAL LOW (ref 8.9–10.3)
Chloride: 105 mmol/L (ref 98–111)
Creatinine, Ser: 0.72 mg/dL (ref 0.44–1.00)
GFR, Estimated: >60 mL/min (ref >60)
Glucose, Bld: 106 mg/dL — ABNORMAL HIGH (ref 70–99)
Potassium: 3.4 mmol/L — ABNORMAL LOW (ref 3.5–5.1)
Sodium: 138 mmol/L (ref 135–145)

## 2021-02-13 LAB — TROPONIN I (HIGH SENSITIVITY): Troponin I (High Sensitivity): 4 ng/L (ref <18)

## 2021-02-13 MED ORDER — SUCRALFATE 1 GM/10ML PO SUSP: 1.0000 g | Freq: Three times a day (TID) | ORAL | 0 refills | Status: DC

## 2021-02-13 MED ORDER — ALUM & MAG HYDROXIDE-SIMETH 200-200-20 MG/5ML PO SUSP
30.0000 mL | Freq: Once | ORAL | Status: AC
  Administered 2021-02-13: 30 mL via ORAL
  Filled 2021-02-13: qty 30

## 2021-02-13 NOTE — ED Triage Notes (Signed)
Pt c/o central chest pain intermittently x 6 days. Denies any shob, n/v.

## 2021-02-13 NOTE — ED Provider Notes (Signed)
[facility] EMERGENCY DEPARTMENT Provider Note   CSN: [id number] Arrival date & time: 02/13/21  0441     History Chief Complaint  Patient presents with  . Chest Pain    Christina Mendez is a 45 y.o. female.  The history is provided by the patient.  Chest Pain Pain location:  Substernal area Pain quality: burning and dull   Pain radiates to:  Does not radiate Pain severity:  Moderate Onset quality:  Gradual Duration:  1 week Timing:  Intermittent Progression:  Unchanged Chronicity:  New Context: at rest   Context: not movement   Context comment:  Movement and beijng active resolves the pain.  laying flat makes it worse  Relieved by:  Nothing Worsened by:  Nothing Ineffective treatments:  None tried Associated symptoms: heartburn   Associated symptoms: no abdominal pain, no back pain, no cough, no diaphoresis, no dizziness, no dysphagia, no fatigue, no fever, no lower extremity edema, no nausea, no near-syncope, no orthopnea, no palpitations, no shortness of breath, no syncope, no vomiting and no weakness   Risk factors: no prior DVT/PE   Patient with GERD and HTN who presents with 7 days of chest pain.  It has been intermittent not exertional.  No DOE.  No SOB.  No n/vd.  This current episodes has been present for >12 hours constantly and worsening.       Past Medical History:  Diagnosis Date  . Acid reflux   . Allergic state 03/02/2012  . Allergy last couple of years   seasonal  . Anxiety   . Back pain   . Cervical cancer screening 03/02/2012  . Chest pain   . Chicken pox 3rd grade  . GERD (gastroesophageal reflux disease)   . GERD (gastroesophageal reflux disease)   . Heartburn   . HTN (hypertension) 12/09/2011  . Hyperlipidemia   . Hypertension   . Inflamed skin tag 03/02/2012  . Menorrhagia 05/04/2015  . OAB (overactive bladder)   . Obese 12/09/2011  . Obesity   . OSA (obstructive sleep apnea)   . Ovarian cyst 08/17/2012  . Palpitations   .  Pedal edema 07/11/2017  . Sinusitis 01/08/2012  . Swollen ankles   . Swollen feet   . Vaginal Pap smear, abnormal     Patient Active Problem List   Diagnosis Date Noted  . Vitamin D deficiency 08/31/2020  . Prediabetes 06/11/2020  . Back pain 04/28/2020  . OAB (overactive bladder) 04/28/2020  . Skin lesion of scalp 01/11/2018  . Pedal edema 07/11/2017  . Esophageal reflux 07/28/2015  . Generalized abdominal pain 07/28/2015  . Menorrhagia 05/04/2015  . Allergic rhinitis 09/05/2014  . Hyperlipidemia, mixed 08/24/2014  . Absence of bladder continence 08/24/2014  . Hyponatremia 08/24/2014  . Insomnia 04/02/2014  . Amenorrhea 02/23/2014  . Ovarian cyst 08/17/2012  . Cervical cancer screening 03/02/2012  . Allergic state 03/02/2012  . Sinusitis 01/08/2012  . Preventative health care 12/11/2011  . HTN (hypertension) 12/09/2011  . Morbid obesity (HCC) 12/09/2011  . Heartburn     Past Surgical History:  Procedure Laterality Date  . cyst removed  45 yr old   benign, abdominal  . HERNIA REPAIR       OB History    Gravida  0   Para  0   Term  0   Preterm  0   AB  0   Living  0     SAB  0   IAB  0   Ectopic  0   Multiple  0   Live Births  0           Family History  Problem Relation Age of Onset  . Fibromyalgia Mother   . Diabetes Mother        type 2  . Hypertension Mother   . Thyroid disease Mother   . Diabetes Father        type 2  . Heart disease Father 58       2 MI  . Hypertension Father   . Heart attack Father 23       X 2  . Stroke Father        mini strokes  . GER disease Father   . Hyperlipidemia Father   . Cholelithiasis Father   . Venous thrombosis Father        in liver  . Colon polyps Father   . Sleep apnea Father   . Obesity Father   . Hypertension Brother   . Venous thrombosis Brother        on spine  . Cancer Maternal Grandfather        stomach  . Hypertension Paternal Grandmother   . Diabetes Paternal Grandmother         type 2  . Heart attack Paternal Grandmother   . Heart disease Paternal Grandmother   . Heart attack Paternal Grandfather   . Heart disease Paternal Grandfather   . Hypertension Paternal Grandfather   . Cancer Maternal Uncle        prostate cancer    Social History   Tobacco Use  . Smoking status: Never Smoker  . Smokeless tobacco: Never Used  Vaping Use  . Vaping Use: Never used  Substance Use Topics  . Alcohol use: Yes    Comment: OCCASIONALLY  . Drug use: No    Home Medications Prior to Admission medications   Medication Sig Start Date End Date Taking? Authorizing Provider  sucralfate (CARAFATE) 1 GM/10ML suspension Take 10 mLs (1 g total) by mouth 4 (four) times daily -  with meals and at bedtime. 02/13/21  Yes Lillie Portner, MD  acetaminophen (TYLENOL) 500 MG tablet Take 500 mg by mouth every 6 (six) hours as needed. 2-3 tabs po qd prn    [provider]  aspirin EC 81 MG tablet Take 81 mg by mouth daily. Swallow whole.    [provider]  cholecalciferol (VITAMIN D) 25 MCG (1000 UNIT) tablet Take 1,000 Units by mouth daily.    [provider]  [employer] Omega-3 Krill Oil 500 MG CAPS Take by mouth.    [provider]  cyclobenzaprine (FLEXERIL) 10 MG tablet Take 1 tablet (10 mg total) by mouth 3 (three) times daily as needed for muscle spasms. 04/28/20   Bradd Canary, MD  fluticasone (FLONASE) 50 MCG/ACT nasal spray Place into both nostrils daily.    [provider]  furosemide (LASIX) 20 MG tablet Take 1-2 tablets (20-40 mg total) by mouth daily as needed for edema. 02/08/21   Bradd Canary, MD  HYDROcodone-acetaminophen (NORCO/VICODIN) 5-325 MG tablet Take 1 tablet by mouth 3 (three) times daily as needed for moderate pain. 01/08/21   Bradd Canary, MD  losartan (COZAAR) 25 MG tablet take 1 tablet by mouth once daily 01/18/21   Bradd Canary, MD  metoprolol succinate (TOPROL-XL) 25 MG 24 hr tablet Take 1 tablet (25 mg total)  by mouth daily. 02/08/21   Bradd Canary, MD  Multiple Vitamins-Minerals (WOMENS MULTI VITAMIN & MINERAL PO) Take by mouth.    [provider]  Norgestimate-Ethinyl Estradiol Triphasic (ORTHO TRI-CYCLEN, 28,) 0.18/0.215/0.25 MG-35 MCG tablet Take 1 tablet by mouth daily. 11/25/20   Willodean Rosenthal, MD  omeprazole (PRILOSEC) 40 MG capsule take 1 capsule by mouth once daily 01/18/21   Bradd Canary, MD  Probiotic Product (PROBIOTIC-10 PO) Take by mouth.    [provider]  Vitamin D, Ergocalciferol, (DRISDOL) 1.25 MG (50000 UNIT) CAPS capsule Take 1 capsule (50,000 Units total) by mouth every 7 (seven) days. 02/02/21   Roswell Nickel, DO    Allergies    Skelaxin  Review of Systems   Review of Systems  Constitutional: Negative for diaphoresis, fatigue and fever.  HENT: Negative for trouble swallowing.   Eyes: Negative for visual disturbance.  Respiratory: Negative for cough, choking, chest tightness and shortness of breath.   Cardiovascular: Positive for chest pain. Negative for palpitations, orthopnea, leg swelling, syncope and near-syncope.  Gastrointestinal: Positive for heartburn. Negative for abdominal pain, nausea and vomiting.  Genitourinary: Negative for difficulty urinating.  Musculoskeletal: Negative for arthralgias, back pain and neck pain.  Skin: Negative for rash.  Neurological: Negative for dizziness and weakness.  Psychiatric/Behavioral: Negative for agitation.  All other systems reviewed and are negative.   Physical Exam Updated Vital Signs BP 139/63 (BP Location: Right Arm)   Pulse 71   Temp 98.3 F (36.8 C) (Oral)   Resp 18   Ht 5\' 7"  (1.702 m)   Wt (!) 163.3 kg   LMP 01/17/2021   SpO2 100%   BMI 56.38 kg/m   Physical Exam Vitals and nursing note reviewed.  Constitutional:      General: She is not in acute distress.    Appearance: Normal appearance.  HENT:     Head: Normocephalic and atraumatic.     Nose: Nose normal.  Eyes:      Conjunctiva/sclera: Conjunctivae normal.     Pupils: Pupils are equal, round, and reactive to light.  Cardiovascular:     Rate and Rhythm: Normal rate and regular rhythm.     Pulses: Normal pulses.     Heart sounds: Normal heart sounds.  Pulmonary:     Effort: Pulmonary effort is normal.     Breath sounds: Normal breath sounds.  Abdominal:     General: Abdomen is flat. Bowel sounds are normal.     Palpations: Abdomen is soft.     Tenderness: There is no abdominal tenderness. There is no guarding or rebound.  Musculoskeletal:        General: Normal range of motion.     Cervical back: Normal range of motion and neck supple.  Skin:    General: Skin is warm and dry.     Capillary Refill: Capillary refill takes less than 2 seconds.  Neurological:     General: No focal deficit present.     Mental Status: She is alert and oriented to person, place, and time.     Deep Tendon Reflexes: Reflexes normal.  Psychiatric:        Mood and Affect: Mood normal.        Behavior: Behavior normal.     ED Results / Procedures / Treatments   Labs (all labs ordered are listed, but only abnormal results are displayed) Labs Reviewed  CBC WITH DIFFERENTIAL/PLATELET  BASIC METABOLIC PANEL  PREGNANCY, URINE  TROPONIN I (HIGH SENSITIVITY)    EKG EKG Interpretation  Date/Time:  Saturday February 13 2021 04:49:50 EST Ventricular Rate:  72 PR Interval:    QRS Duration: 84 QT Interval:  391 QTC Calculation: 428 R Axis:   57 Text Interpretation: Sinus rhythm Confirmed by Nicanor AlconPalumbo, Emmett Bracknell ([id number]) on 02/13/2021 4:53:31 AM   Radiology No results found.  Procedures Procedures   Medications Ordered in ED Medications  alum & mag hydroxide-simeth (MAALOX/MYLANTA) 200-200-20 MG/5ML suspension 30 mL (30 mLs Oral Given 02/13/21 0502)    ED Course  I have reviewed the triage vital signs and the nursing notes.  Pertinent labs & imaging results that were available during my care of the patient were  reviewed by me and considered in my medical decision making (see chart for details).    Pain > 12 hours constantly.  One troponin is sufficient to exclude ACS.  HEART score is 2, low risk for MACE.  Symptoms are highly atypical for cardiac and pulmonary etiology.  Symptoms most consistent with GERD.  Will add carafate.  Strict return precautions given.    Christina Mendez was evaluated in Emergency Department on 02/13/2021 for the symptoms described in the history of present illness. She was evaluated in the context of the global COVID-19 pandemic, which necessitated consideration that the patient might be at risk for infection with the SARS-CoV-2 virus that causes COVID-19. Institutional protocols and algorithms that pertain to the evaluation of patients at risk for COVID-19 are in a state of rapid change based on information released by regulatory bodies including the CDC and federal and state organizations. These policies and algorithms were followed during the patient's care in the ED.  Final Clinical Impression(s) / ED Diagnoses Final diagnoses:  Precordial pain    Return for intractable cough, coughing up blood, fevers >100.4 unrelieved by medication, shortness of breath, intractable vomiting, chest pain, shortness of breath, weakness, numbness, changes in speech, facial asymmetry, abdominal pain, passing out, Inability to tolerate liquids or food, cough, altered mental status or any concerns. No signs of systemic illness or infection. The patient is nontoxic-appearing on exam and vital signs are within normal limits.  I have reviewed the triage vital signs and the nursing notes. Pertinent labs & imaging results that were available during my care of the patient were reviewed by me and considered in my medical decision making (see chart for details). After history, exam, and medical workup I feel the patient has been appropriately medically screened and is safe for discharge home. Pertinent  diagnoses were discussed with the patient. Patient was given return precautions.  Rx / DC Orders ED Discharge Orders         Ordered    sucralfate (CARAFATE) 1 GM/10ML suspension  3 times daily with meals & bedtime        02/13/21 0504           Kinisha Soper, MD 02/13/21 [id number]

## 2021-02-17 ENCOUNTER — Ambulatory Visit: Payer: 59 | Admitting: Obstetrics & Gynecology

## 2021-02-18 ENCOUNTER — Telehealth (INDEPENDENT_AMBULATORY_CARE_PROVIDER_SITE_OTHER): Payer: 59 | Admitting: Bariatrics

## 2021-02-18 ENCOUNTER — Encounter (INDEPENDENT_AMBULATORY_CARE_PROVIDER_SITE_OTHER): Payer: Self-pay | Admitting: Bariatrics

## 2021-02-18 DIAGNOSIS — E782 Mixed hyperlipidemia: Secondary | ICD-10-CM | POA: Diagnosis not present

## 2021-02-18 DIAGNOSIS — E8881 Metabolic syndrome: Secondary | ICD-10-CM | POA: Diagnosis not present

## 2021-02-18 DIAGNOSIS — Z6841 Body Mass Index (BMI) 40.0 and over, adult: Secondary | ICD-10-CM | POA: Diagnosis not present

## 2021-02-23 NOTE — Progress Notes (Signed)
TeleHealth Visit:  Due to the COVID-19 pandemic, this visit was completed with telemedicine (audio/video) technology to reduce patient and provider exposure as well as to preserve personal protective equipment.   Egan has verbally consented to this TeleHealth visit. The patient is located at home, the provider is located at the [facility] office. The participants in this visit include the listed provider and patient. The visit was conducted today via MyChart video.  Chief Complaint: OBESITY Adianna is here to discuss her progress with her obesity treatment plan along with follow-up of her obesity related diagnoses. Melika is on the Category 3 Plan and states she is following her eating plan approximately 60% of the time. Mahagony states she is walking for 40 minutes 7 times per week and swimming for 60 minutes 5 times per week.  Today's visit was #: 11 Starting weight: 376 lbs Starting date: 06/10/2020  Interim History: Marytza thinks that she may have lost weight.  She is doing well with her water.  Subjective:   1. Hyperlipidemia, mixed Tynleigh has hyperlipidemia and has been trying to improve her cholesterol levels with intensive lifestyle modification including a low saturated fat diet, exercise and weight loss. She denies any chest pain, claudication or myalgias.  She is taking krill oil.  Lab Results  Component Value Date   ALT 15 02/02/2021   AST 14 02/02/2021   ALKPHOS 57 02/02/2021   BILITOT 0.3 02/02/2021   Lab Results  Component Value Date   CHOL 174 02/02/2021   HDL 62 02/02/2021   LDLCALC 94 02/02/2021   TRIG 101 02/02/2021   CHOLHDL 3.3 09/14/2020   2. Insulin resistance Bryar has a diagnosis of insulin resistance based on her elevated fasting insulin level >5. She continues to work on diet and exercise to decrease her risk of diabetes.  She is on no medications for this.  Lab Results  Component Value Date   INSULIN 15.4  02/02/2021   INSULIN 35.9 (H) 06/10/2020   Lab Results  Component Value Date   HGBA1C 5.7 (H) 02/02/2021   Assessment/Plan:   1. Hyperlipidemia, mixed Cardiovascular risk and specific lipid/LDL goals reviewed.  We discussed several lifestyle modifications today and Kinnedy will continue to work on diet, exercise and weight loss efforts. Orders and follow up as documented in patient record.  Eliminate trans fats and decrease saturated fats from diet.  Counseling Intensive lifestyle modifications are the first line treatment for this issue. . Dietary changes: Increase soluble fiber. Decrease simple carbohydrates. . Exercise changes: Moderate to vigorous-intensity aerobic activity 150 minutes per week if tolerated. . Lipid-lowering medications: see documented in medical record.  2. Insulin resistance Tavie will continue to work on weight loss, exercise, and decreasing simple carbohydrates to help decrease the risk of diabetes. Kalana agreed to follow-up with [place] as directed to closely monitor her progress.  Decrease carbohydrates and increase activities.   3. 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 As such, her goal is to continue with weight loss efforts. She has agreed to the Category 3 Plan.   She will work on meal planning, increasing activities, and keeping water and protein levels high.  Exercise goals: Continue walking and swimming.  Behavioral modification strategies: increasing lean protein intake, decreasing simple carbohydrates, increasing vegetables, increasing water intake, decreasing eating out, no skipping meals, meal planning  and cooking strategies, keeping healthy foods in the home and planning for success.  Rosaland has agreed to follow-up with our clinic in 2 weeks. She was informed of the importance of frequent follow-up  visits to maximize her success with intensive lifestyle modifications for her multiple health conditions.  Objective:   VITALS: Per patient if applicable, see vitals. GENERAL: Alert and in no acute distress. CARDIOPULMONARY: No increased WOB. Speaking in clear sentences.  PSYCH: Pleasant and cooperative. Speech normal rate and rhythm. Affect is appropriate. Insight and judgement are appropriate. Attention is focused, linear, and appropriate.  NEURO: Oriented as arrived to appointment on time with no prompting.   Lab Results  Component Value Date   CREATININE 0.72 02/13/2021   BUN 11 02/13/2021   NA 138 02/13/2021   K 3.4 (L) 02/13/2021   CL 105 02/13/2021   CO2 25 02/13/2021   Lab Results  Component Value Date   ALT 15 02/02/2021   AST 14 02/02/2021   ALKPHOS 57 02/02/2021   BILITOT 0.3 02/02/2021   Lab Results  Component Value Date   HGBA1C 5.7 (H) 02/02/2021   HGBA1C 5.6 09/14/2020   HGBA1C 5.6 06/10/2020   Lab Results  Component Value Date   INSULIN 15.4 02/02/2021   INSULIN 35.9 (H) 06/10/2020   Lab Results  Component Value Date   TSH 0.47 09/14/2020   Lab Results  Component Value Date   CHOL 174 02/02/2021   HDL 62 02/02/2021   LDLCALC 94 02/02/2021   TRIG 101 02/02/2021   CHOLHDL 3.3 09/14/2020   Lab Results  Component Value Date   WBC 5.8 02/13/2021   HGB 12.7 02/13/2021   HCT 38.0 02/13/2021   MCV 89.6 02/13/2021   PLT 299 02/13/2021   Attestation Statements:   Reviewed by clinician on day of visit: allergies, medications, problem list, medical history, surgical history, family history, social history, and previous encounter notes.  Time spent on visit including pre-visit chart review and post-visit charting and care was 20 minutes.   I, Insurance claims handler, CMA, am acting as Energy manager for [employer], DO  I have reviewed the above documentation for accuracy and completeness, and I agree with the above. Corinna Capra, DO

## 2021-03-04 ENCOUNTER — Ambulatory Visit (INDEPENDENT_AMBULATORY_CARE_PROVIDER_SITE_OTHER): Payer: 59 | Admitting: Bariatrics

## 2021-04-06 ENCOUNTER — Other Ambulatory Visit (INDEPENDENT_AMBULATORY_CARE_PROVIDER_SITE_OTHER): Payer: Self-pay | Admitting: Bariatrics

## 2021-04-06 ENCOUNTER — Encounter (INDEPENDENT_AMBULATORY_CARE_PROVIDER_SITE_OTHER): Payer: Self-pay | Admitting: Bariatrics

## 2021-04-06 DIAGNOSIS — E559 Vitamin D deficiency, unspecified: Secondary | ICD-10-CM

## 2021-04-06 MED ORDER — VITAMIN D (ERGOCALCIFEROL) 1.25 MG (50000 UNIT) PO CAPS: 50000.0000 [IU] | ORAL_CAPSULE | ORAL | 0 refills | Status: DC

## 2021-04-06 NOTE — Telephone Encounter (Signed)
Please review

## 2021-04-06 NOTE — Telephone Encounter (Signed)
Refill request

## 2021-06-18 ENCOUNTER — Other Ambulatory Visit: Payer: Self-pay | Admitting: Family Medicine

## 2021-06-18 DIAGNOSIS — R600 Localized edema: Secondary | ICD-10-CM

## 2021-06-28 ENCOUNTER — Other Ambulatory Visit (INDEPENDENT_AMBULATORY_CARE_PROVIDER_SITE_OTHER): Payer: Self-pay | Admitting: Bariatrics

## 2021-06-28 DIAGNOSIS — E559 Vitamin D deficiency, unspecified: Secondary | ICD-10-CM

## 2021-06-30 IMAGING — MG DIGITAL SCREENING BILAT W/ TOMO W/ CAD
6 of 10 series · 6 of 30 positions shown · non-contrast
Comparison: None.

ACR Breast Density Category a: The breast tissue is almost entirely
fatty.

CLINICAL DATA: Screening.

EXAM:
DIGITAL SCREENING BILATERAL MAMMOGRAM WITH TOMO AND CAD

[R CC synth-2D]
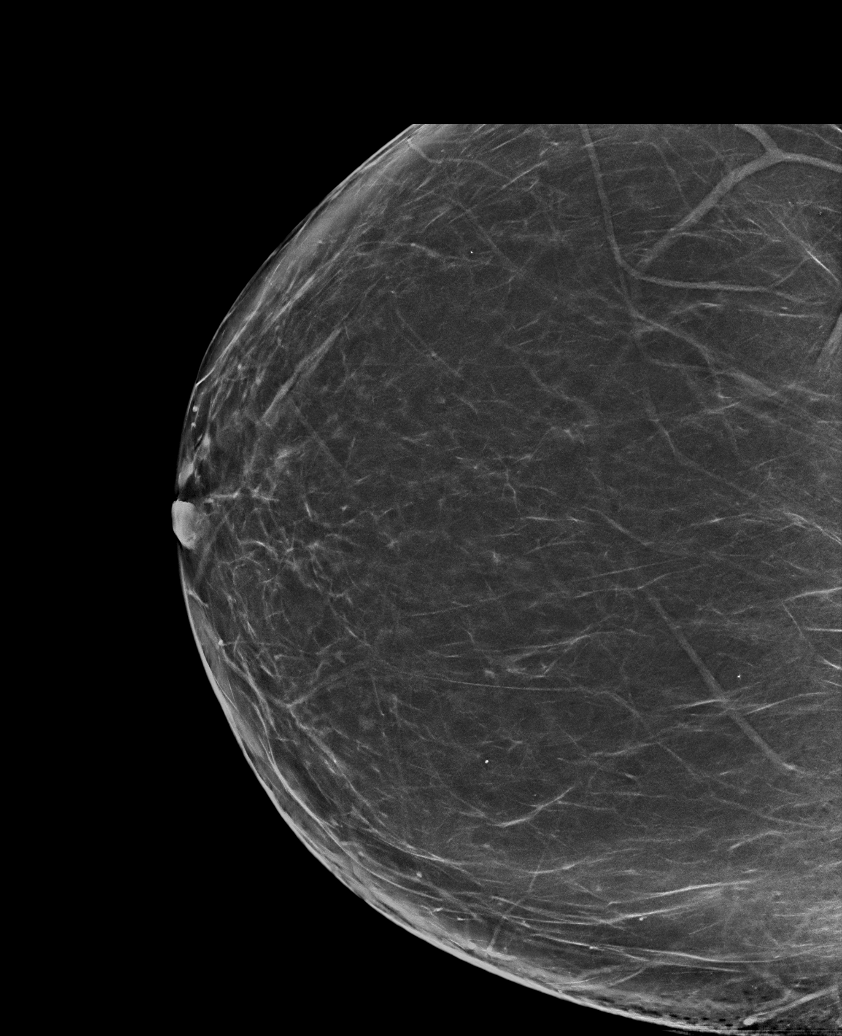

[L MLO synth-2D]
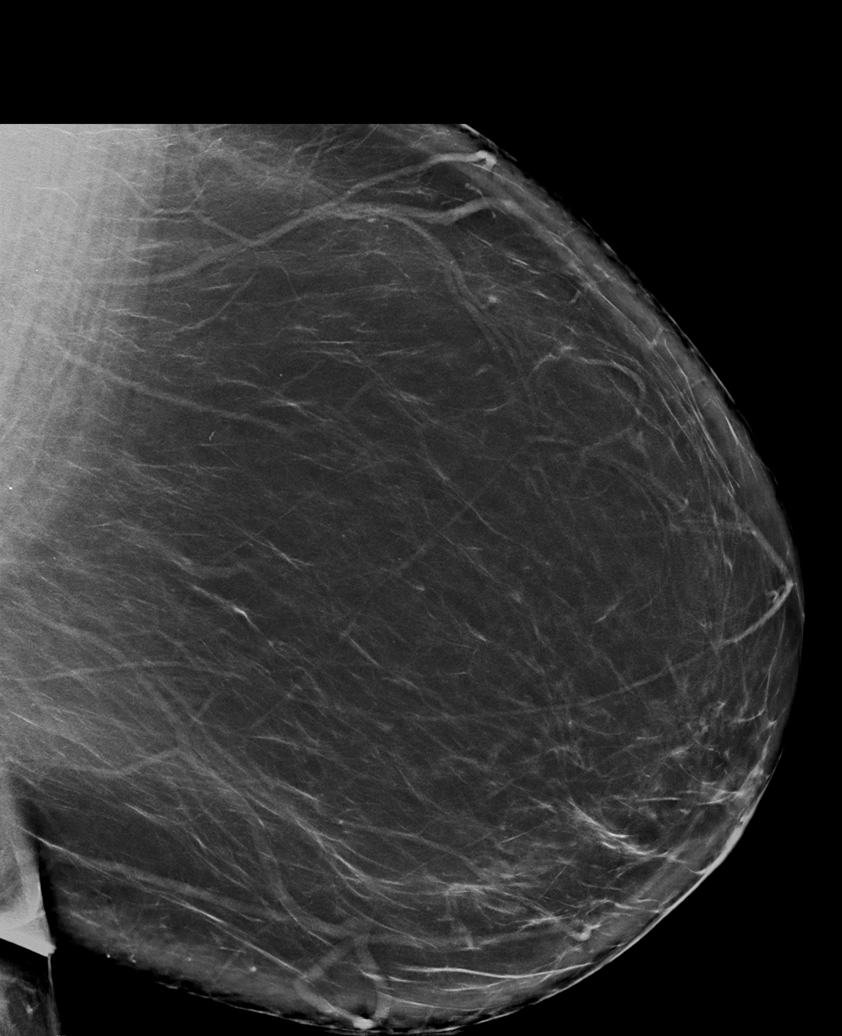

[L CV synth-2D]
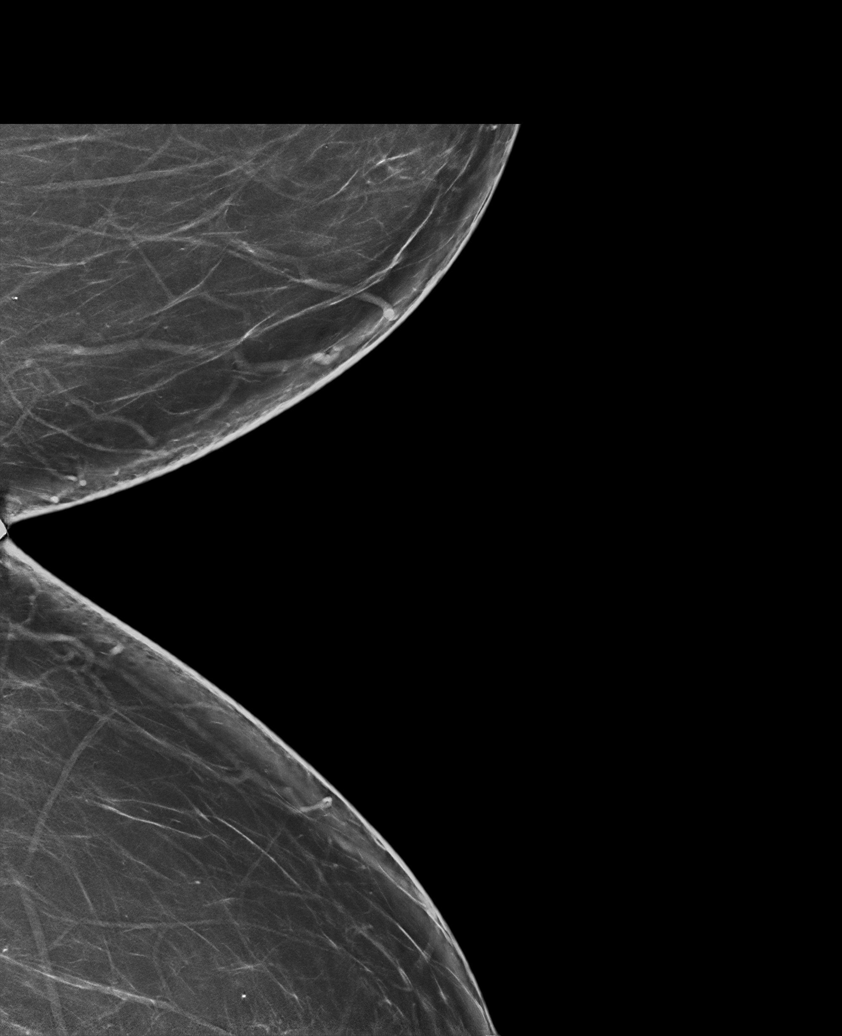

[L CC synth-2D]
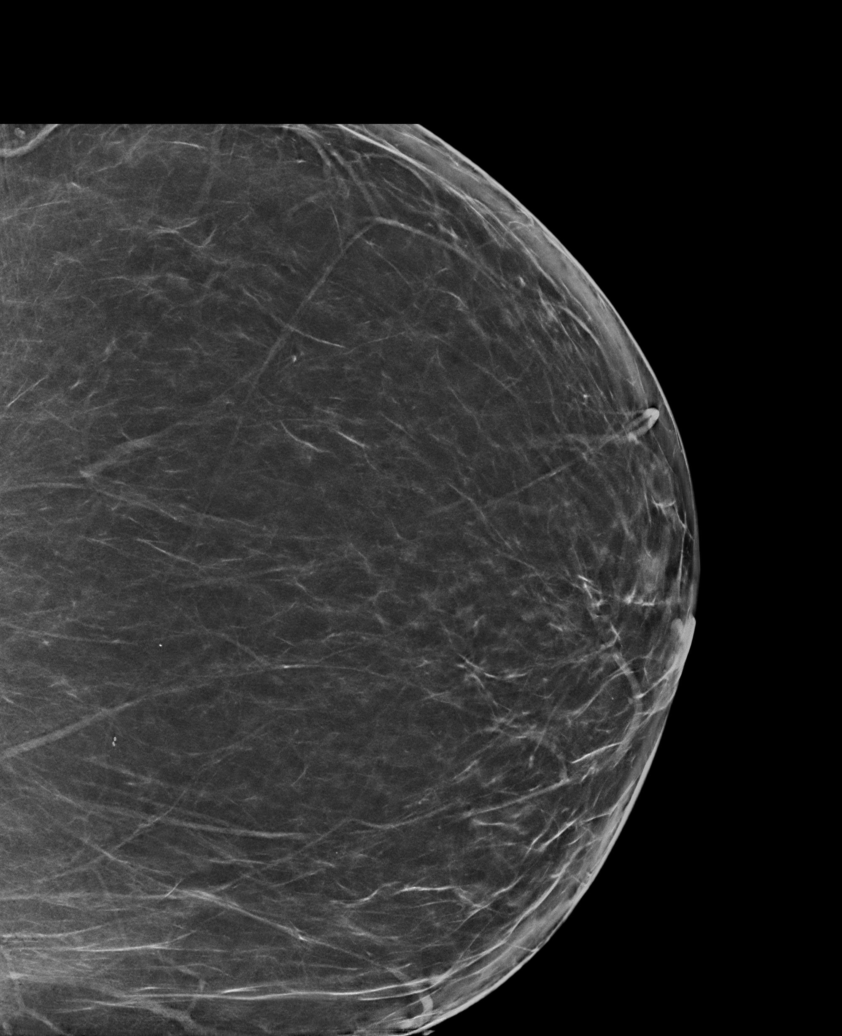

[R MLO synth-2D]
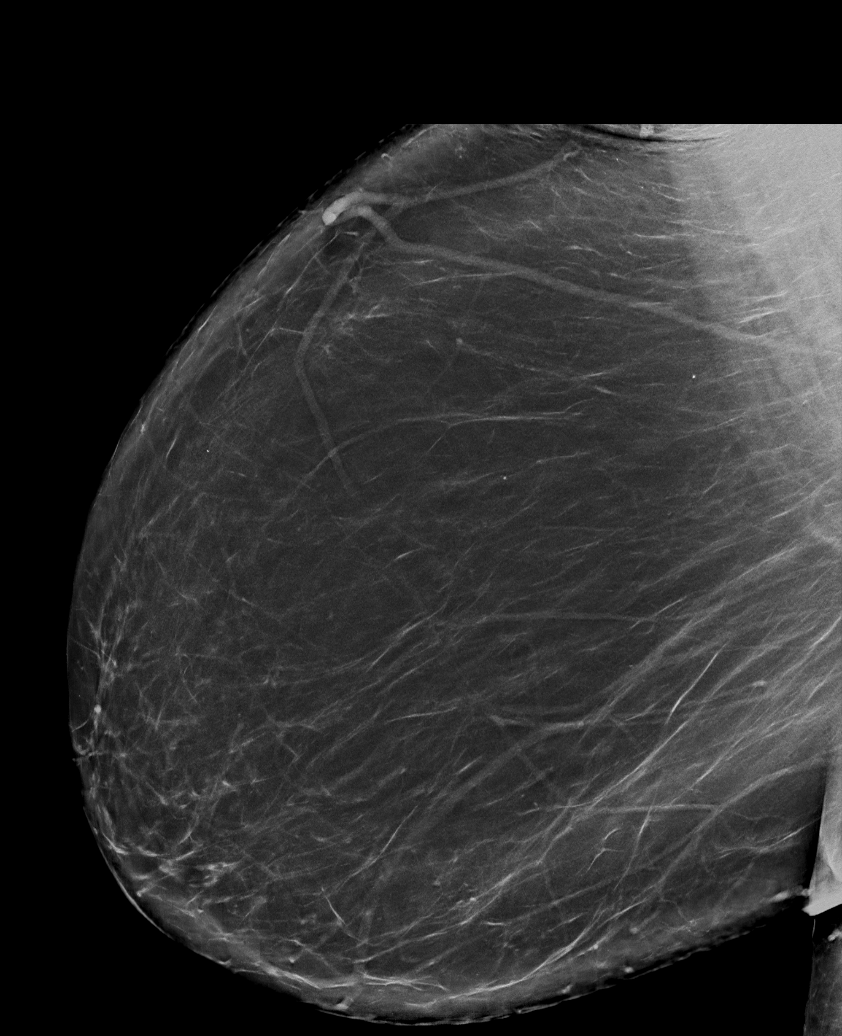

[R MLO tomo · tomo slice 53/104.0]
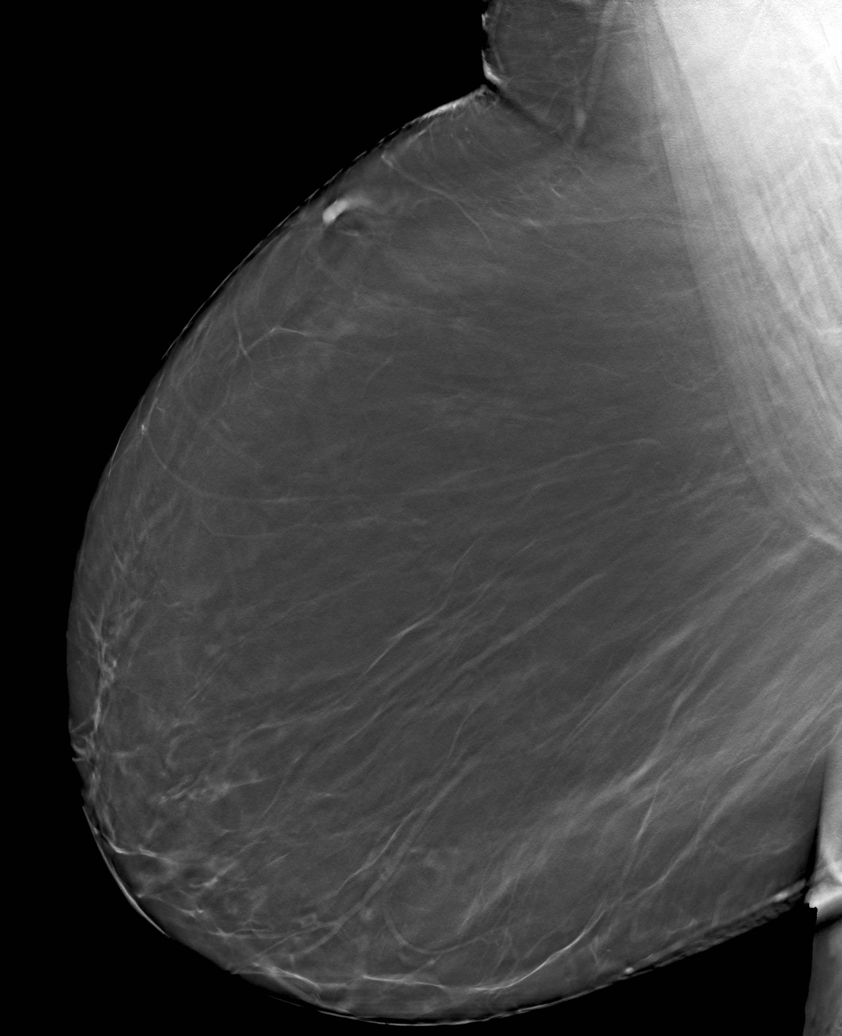

[6 of 30 positions shown; findings below may reference images not displayed]

FINDINGS: There are no findings suspicious for malignancy. Images were
processed with CAD.
IMPRESSION: No mammographic evidence of malignancy. A result letter of this
screening mammogram will be mailed directly to the patient.

RECOMMENDATION:
Screening mammogram in one year. (Code:[id number])

BI-RADS CATEGORY  1: Negative.

## 2021-07-05 ENCOUNTER — Encounter: Payer: Self-pay | Admitting: Family Medicine

## 2021-07-05 ENCOUNTER — Ambulatory Visit (INDEPENDENT_AMBULATORY_CARE_PROVIDER_SITE_OTHER): Payer: 59 | Admitting: Family Medicine

## 2021-07-05 ENCOUNTER — Other Ambulatory Visit: Payer: Self-pay

## 2021-07-05 VITALS — BP 132/86 | HR 90 | Temp 97.7°F | Resp 18 | Ht 67.0 in | Wt 378.6 lb

## 2021-07-05 DIAGNOSIS — Z0000 Encounter for general adult medical examination without abnormal findings: Secondary | ICD-10-CM | POA: Diagnosis not present

## 2021-07-05 DIAGNOSIS — E559 Vitamin D deficiency, unspecified: Secondary | ICD-10-CM | POA: Diagnosis not present

## 2021-07-05 DIAGNOSIS — M545 Low back pain, unspecified: Secondary | ICD-10-CM

## 2021-07-05 DIAGNOSIS — Z1211 Encounter for screening for malignant neoplasm of colon: Secondary | ICD-10-CM

## 2021-07-05 DIAGNOSIS — R7303 Prediabetes: Secondary | ICD-10-CM

## 2021-07-05 DIAGNOSIS — I10 Essential (primary) hypertension: Secondary | ICD-10-CM

## 2021-07-05 DIAGNOSIS — Z1231 Encounter for screening mammogram for malignant neoplasm of breast: Secondary | ICD-10-CM | POA: Diagnosis not present

## 2021-07-05 DIAGNOSIS — G8929 Other chronic pain: Secondary | ICD-10-CM

## 2021-07-05 DIAGNOSIS — E782 Mixed hyperlipidemia: Secondary | ICD-10-CM

## 2021-07-05 DIAGNOSIS — R600 Localized edema: Secondary | ICD-10-CM

## 2021-07-05 LAB — COMPREHENSIVE METABOLIC PANEL
ALT: 15 U/L (ref 0–35)
AST: 14 U/L (ref 0–37)
Albumin: 3.7 g/dL (ref 3.5–5.2)
Alkaline Phosphatase: 53 U/L (ref 39–117)
BUN: 7 mg/dL (ref 6–23)
CO2: 28 mEq/L (ref 19–32)
Calcium: 8.7 mg/dL (ref 8.4–10.5)
Chloride: 103 mEq/L (ref 96–112)
Creatinine, Ser: 0.74 mg/dL (ref 0.40–1.20)
GFR: 96.97 mL/min (ref >60.00)
Glucose, Bld: 97 mg/dL (ref 70–99)
Potassium: 4.5 mEq/L (ref 3.5–5.1)
Sodium: 138 mEq/L (ref 135–145)
Total Bilirubin: 0.4 mg/dL (ref 0.2–1.2)
Total Protein: 6.8 g/dL (ref 6.0–8.3)

## 2021-07-05 LAB — LIPID PANEL
Cholesterol: 181 mg/dL (ref 0–200)
HDL: 69.6 mg/dL (ref >39.00)
LDL Cholesterol: 84 mg/dL (ref 0–99)
NonHDL: 111.74
Total CHOL/HDL Ratio: 3
Triglycerides: 141 mg/dL (ref 0.0–149.0)
VLDL: 28.2 mg/dL (ref 0.0–40.0)

## 2021-07-05 LAB — VITAMIN D 25 HYDROXY (VIT D DEFICIENCY, FRACTURES): VITD: 27.43 ng/mL — ABNORMAL LOW (ref 30.00–100.00)

## 2021-07-05 LAB — CBC
HCT: 40 % (ref 36.0–46.0)
Hemoglobin: 13.4 g/dL (ref 12.0–15.0)
MCHC: 33.5 g/dL (ref 30.0–36.0)
MCV: 89 fl (ref 78.0–100.0)
Platelets: 262 10*3/uL (ref 150.0–400.0)
RBC: 4.49 Mil/uL (ref 3.87–5.11)
RDW: 13.9 % (ref 11.5–15.5)
WBC: 4.4 10*3/uL (ref 4.0–10.5)

## 2021-07-05 LAB — TSH: TSH: 0.65 u[IU]/mL (ref 0.35–5.50)

## 2021-07-05 LAB — HEMOGLOBIN A1C: Hgb A1c MFr Bld: 5.7 % (ref 4.6–6.5)

## 2021-07-05 NOTE — Assessment & Plan Note (Signed)
Supplement and monitor 

## 2021-07-05 NOTE — Assessment & Plan Note (Signed)
Encourage heart healthy diet such as MIND or DASH diet, increase exercise, avoid trans fats, simple carbohydrates and processed foods, consider a krill or fish or flaxseed oil cap daily.  °

## 2021-07-05 NOTE — Patient Instructions (Addendum)
Paxlovid or Molnupiravir is the new COVID medication we can give you if you get COVID so make sure you test if you have symptoms because we have to treat by day 5 of symptoms for it to be effective. If you are positive let us know so we can treat. If a home test is negative and your symptoms are persistent get a PCR test. Can check testing locations at [email or web address] If you are positive we will make an appointment with [place] and we will send in Paxlovid if you would like it. Check with your pharmacy before we meet to confirm they have it in stock, if they do not then we can get the prescription at the [facility]   Preventive Care 70-55 Years Old, Female Preventive care refers to lifestyle choices and visits with your health care provider that can promote health and wellness. This includes: A yearly physical exam. This is also called an annual wellness visit. Regular dental and eye exams. Immunizations. Screening for certain conditions. Healthy lifestyle choices, such as: Eating a healthy diet. Getting regular exercise. Not using drugs or products that contain nicotine and tobacco. Limiting alcohol use. What can I expect for my preventive care visit? Physical exam Your health care provider will check your: Height and weight. These may be used to calculate your BMI (body mass index). BMI is a measurement that tells if you are at a healthy weight. Heart rate and blood pressure. Body temperature. Skin for abnormal spots. Counseling Your health care provider may ask you questions about your: Past medical problems. Family's medical history. Alcohol, tobacco, and drug use. Emotional well-being. Home life and relationship well-being. Sexual activity. Diet, exercise, and sleep habits. Work and work Statistician. Access to firearms. Method of birth control. Menstrual cycle. Pregnancy history. What immunizations do I need?  Vaccines are usually given at various ages,  according to a schedule. Your health care provider will recommend vaccines for you based on your age, medicalhistory, and lifestyle or other factors, such as travel or where you work. What tests do I need? Blood tests Lipid and cholesterol levels. These may be checked every 5 years, or more often if you are over 12 years old. Hepatitis C test. Hepatitis B test. Screening Lung cancer screening. You may have this screening every year starting at age 65 if you have a 30-pack-year history of smoking and currently smoke or have quit within the past 15 years. Colorectal cancer screening. All adults should have this screening starting at age 7 and continuing until age 85. Your health care provider may recommend screening at age 30 if you are at increased risk. You will have tests every 1-10 years, depending on your results and the type of screening test. Diabetes screening. This is done by checking your blood sugar (glucose) after you have not eaten for a while (fasting). You may have this done every 1-3 years. Mammogram. This may be done every 1-2 years. Talk with your health care provider about when you should start having regular mammograms. This may depend on whether you have a family history of breast cancer. BRCA-related cancer screening. This may be done if you have a family history of breast, ovarian, tubal, or peritoneal cancers. Pelvic exam and Pap test. This may be done every 3 years starting at age 48. Starting at age 78, this may be done every 5 years if you have a Pap test in combination with an HPV test. Other tests STD (sexually transmitted disease) testing, if  you are at risk. Bone density scan. This is done to screen for osteoporosis. You may have this scan if you are at high risk for osteoporosis. Talk with your health care provider about your test results, treatment options,and if necessary, the need for more tests. Follow these instructions at home: Eating and  drinking  Eat a diet that includes fresh fruits and vegetables, whole grains, lean protein, and low-fat dairy products. Take vitamin and mineral supplements as recommended by your health care provider. Do not drink alcohol if: Your health care provider tells you not to drink. You are pregnant, may be pregnant, or are planning to become pregnant. If you drink alcohol: Limit how much you have to 0-1 drink a day. Be aware of how much alcohol is in your drink. In the [place] one drink equals one 12 oz bottle of beer (355 mL), one 5 oz glass of wine (148 mL), or one 1 oz glass of hard liquor (44 mL).  Lifestyle Take daily care of your teeth and gums. Brush your teeth every morning and night with fluoride toothpaste. Floss one time each day. Stay active. Exercise for at least 30 minutes 5 or more days each week. Do not use any products that contain nicotine or tobacco, such as cigarettes, e-cigarettes, and chewing tobacco. If you need help quitting, ask your health care provider. Do not use drugs. If you are sexually active, practice safe sex. Use a condom or other form of protection to prevent STIs (sexually transmitted infections). If you do not wish to become pregnant, use a form of birth control. If you plan to become pregnant, see your health care provider for a prepregnancy visit. If told by your health care provider, take low-dose aspirin daily starting at age 57. Find healthy ways to cope with stress, such as: Meditation, yoga, or listening to music. Journaling. Talking to a trusted person. Spending time with friends and family. Safety Always wear your seat belt while driving or riding in a vehicle. Do not drive: If you have been drinking alcohol. Do not ride with someone who has been drinking. When you are tired or distracted. While texting. Wear a helmet and other protective equipment during sports activities. If you have firearms in your house, make sure you follow all gun safety  procedures. What's next? Visit your health care provider once a year for an annual wellness visit. Ask your health care provider how often you should have your eyes and teeth checked. Stay up to date on all vaccines. This information is not intended to replace advice given to you by your health care provider. Make sure you discuss any questions you have with your healthcare provider. Document Revised: 09/01/2020 Document Reviewed: 08/09/2018 Elsevier Patient Education  2022 [employer].

## 2021-07-05 NOTE — Progress Notes (Signed)
Subjective:   By signing my name below, I, Zite Okoli, attest that this documentation has been prepared under the direction and in the presence of Bradd Canary, MD 07/05/2021    Patient ID: Christina Mendez, female    DOB: 1976-06-03, 45 y.o.   MRN: [id number]  Chief Complaint  Patient presents with   Annual Exam    HPI Patient is in today for a physical exam.  At this visit, her blood pressure is doing well. She has been managing her blood pressure with 25 mg metoprolol succinate PO daily and 25 mg losartan PO daily.  BP Readings from Last 3 Encounters:  07/05/21 132/86  02/13/21 (!) 152/103  02/02/21 (!) 146/91    She used to visit the health weight and wellness program but has not been visiting for a while because it is expensive. She still has the plan and is following it. She mentions she has lost 25 pounds. She reports that  she is less fatigued and her back pain is gone. She reports she is able to take longer walks now without feeling the back pain. She had some fluid retention and manages it with 40 mg Lasix prn. She also uses compression stockings. She is interested in getting a colonoscopy done. There have been no bowel changes. She has 3 Covid-19 vaccines at this time and is willing to get the 2nd booster vaccine. She had Covid-19 and was asymptomatic. She is UTD on tetanus vaccines. She has been managing a healthy diet and exercises regularly by swimming 3-5 times weekly. She mentions that her workplace is not stressful and sits for long periods of time. She denies fever, congestion, eye pain, shortness of breath, chest pain, palpitations, leg swelling. Also denies abdominal pain, blood in stool, dysuia, frequency, diarrhea, nausea, back pain and headaches. There is no family history of colon cancer. Her father was diagnosed with prostate cancer a year ago at 31. There has been no other changes to her family history. She does not drink alcohol. She does not smoke  tobacco.   Past Medical History:  Diagnosis Date   Acid reflux    Allergic state 03/02/2012   Allergy last couple of years   seasonal   Anxiety    Back pain    Cervical cancer screening 03/02/2012   Chest pain    Chicken pox 3rd grade   GERD (gastroesophageal reflux disease)    GERD (gastroesophageal reflux disease)    Heartburn    HTN (hypertension) 12/09/2011   Hyperlipidemia    Hypertension    Inflamed skin tag 03/02/2012   Menorrhagia 05/04/2015   OAB (overactive bladder)    Obese 12/09/2011   Obesity    OSA (obstructive sleep apnea)    Ovarian cyst 08/17/2012   Palpitations    Pedal edema 07/11/2017   Sinusitis 01/08/2012   Swollen ankles    Swollen feet    Vaginal Pap smear, abnormal     Past Surgical History:  Procedure Laterality Date   cyst removed  45 yr old   benign, abdominal   HERNIA REPAIR      Family History  Problem Relation Age of Onset   Fibromyalgia Mother    Diabetes Mother        type 2   Hypertension Mother    Thyroid disease Mother    Cancer Father 58       prostate cancer   Diabetes Father        type 2  Heart disease Father 59       2 MI   Hypertension Father    Heart attack Father 9       X 2   Stroke Father        mini strokes   GER disease Father    Hyperlipidemia Father    Cholelithiasis Father    Venous thrombosis Father        in liver   Colon polyps Father    Sleep apnea Father    Obesity Father    Hypertension Brother    Venous thrombosis Brother        on spine   Cancer Maternal Uncle        prostate cancer   Cancer Maternal Grandfather        stomach   Hypertension Paternal Grandmother    Diabetes Paternal Grandmother        type 2   Heart attack Paternal Grandmother    Heart disease Paternal Grandmother    Heart attack Paternal Grandfather    Heart disease Paternal Grandfather    Hypertension Paternal Grandfather     Social History   Socioeconomic History   Marital status: Single    Spouse name: Not  on file   Number of children: Not on file   Years of education: Not on file   Highest education level: Bachelor's degree (e.g., BA, AB, BS)  Occupational History   Occupation: Direct Care Provider  Tobacco Use   Smoking status: Never   Smokeless tobacco: Never  Vaping Use   Vaping Use: Never used  Substance and Sexual Activity   Alcohol use: Yes    Comment: OCCASIONALLY   Drug use: No   Sexual activity: Yes    Partners: Male    Comment: No dietary restrictions, lives with Mom, Dad and brother and niece.  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 2-3 tabs po qd prn     aspirin EC 81 MG tablet Take 81 mg by mouth daily. Swallow whole.     cholecalciferol (VITAMIN D) 25 MCG (1000 UNIT) tablet Take 1,000 Units by mouth daily.     [employer] Omega-3 Krill Oil 500 MG CAPS Take by mouth.     cyclobenzaprine (FLEXERIL) 10 MG tablet Take 1 tablet (10 mg total) by mouth 3 (three) times daily as needed for muscle spasms. 90 tablet 2   fluticasone (FLONASE) 50 MCG/ACT nasal spray Place into both nostrils daily.     furosemide (LASIX) 20 MG tablet take 1 to 2 tablets by mouth daily as needed for edema 60 tablet 0   losartan (COZAAR) 25 MG tablet take 1 tablet by mouth once daily 90 tablet 1   metoprolol succinate (TOPROL-XL) 25 MG 24 hr tablet Take 1 tablet (25 mg total) by mouth daily. 90 tablet 1   Multiple Vitamins-Minerals (WOMENS MULTI VITAMIN & MINERAL PO) Take by mouth.     Norgestimate-Ethinyl  Estradiol  Triphasic (ORTHO TRI-CYCLEN, 28,) 0.18/0.215/0.25 MG-35 MCG tablet Take 1 tablet by mouth daily. 28 tablet 11   omeprazole (PRILOSEC) 40 MG capsule take 1 capsule by mouth once daily 90 capsule 1   Probiotic Product (PROBIOTIC-10 PO) Take by mouth.     sucralfate (CARAFATE) 1 GM/10ML suspension Take 10 mLs (1 g total) by mouth 4 (four) times daily -  with meals and at bedtime. 420 mL 0   Vitamin D, Ergocalciferol, (DRISDOL) 1.25 MG (50000 UNIT) CAPS capsule Take 1 capsule (50,000 Units total) by mouth every 7 (seven) days. 12 capsule 0   HYDROcodone-acetaminophen (NORCO/VICODIN) 5-325 MG tablet Take 1 tablet by mouth 3 (three) times daily as needed for moderate pain. (Patient not taking: Reported on 07/05/2021) 30 tablet 0   No facility-administered medications prior to visit.    Allergies  Allergen Reactions   Skelaxin Hypertension    Review of Systems  Constitutional:  Negative for fever.  HENT:  Negative for congestion.   Eyes:  Negative for pain.  Respiratory:  Negative for shortness of breath.   Cardiovascular:  Negative for chest pain, palpitations and leg swelling.  Gastrointestinal:  Negative for nausea.  Genitourinary:  Negative for dysuria and frequency.  Musculoskeletal:  Negative for back pain.  Neurological:  Negative for headaches.  Psychiatric/Behavioral:  The patient is not nervous/anxious.       Objective:    Physical Exam Constitutional:      Appearance: She is not ill-appearing.  HENT:     Head: Normocephalic and atraumatic.     Right Ear: Tympanic membrane, ear canal and external ear normal.     Left Ear: Tympanic membrane, ear canal and external ear normal.  Eyes:     Extraocular Movements: Extraocular movements intact.     Conjunctiva/sclera: Conjunctivae normal.     Pupils: Pupils are equal, round, and reactive to light.     Comments: (-)nystagmus  Cardiovascular:     Rate and Rhythm: Normal rate and regular rhythm.     Pulses: Normal  pulses.     Heart sounds: Normal heart sounds. No murmur heard. Pulmonary:     Effort: No respiratory distress.     Breath sounds: Normal breath sounds.  Abdominal:     General: Bowel sounds are normal. There is no distension.     Palpations: Abdomen is soft. There is no mass.     Tenderness: There is no abdominal tenderness.  Musculoskeletal:     Cervical back: Neck supple.     Right lower leg: 1+ Edema present.     Left lower leg: 1+ Edema present.     Comments: 5/5 strength in upper and lower extremities  Lymphadenopathy:     Cervical: No cervical adenopathy.  Skin:    General: Skin is warm and dry.  Neurological:     Mental Status: She is alert and oriented to person, place, and time.     Comments: Unable to obtain patellar reflexes  Psychiatric:        Behavior: Behavior normal.    BP 132/86   Pulse 90   Temp 97.7 F (36.5 C)   Resp 18   Ht  (1.702 m)   Wt (!) 378 lb 9.6 oz (171.7 kg)   SpO2 99%   BMI 59.30 kg/m  Wt Readings from Last 3 Encounters:  07/05/21 (!) 378 lb 9.6 oz (171.7 kg)  02/13/21 (!) 360 lb (163.3 kg)  02/02/21 (!) 370 lb (167.8 kg)    Diabetic  Foot Exam - Simple   No data filed    Lab Results  Component Value Date   WBC 5.8 02/13/2021   HGB 12.7 02/13/2021   HCT 38.0 02/13/2021   PLT 299 02/13/2021   GLUCOSE 106 (H) 02/13/2021   CHOL 174 02/02/2021   TRIG 101 02/02/2021   HDL 62 02/02/2021   LDLCALC 94 02/02/2021   ALT 15 02/02/2021   AST 14 02/02/2021   NA 138 02/13/2021   K 3.4 (L) 02/13/2021   CL 105 02/13/2021   CREATININE 0.72 02/13/2021   BUN 11 02/13/2021   CO2 25 02/13/2021   TSH 0.47 09/14/2020   HGBA1C 5.7 (H) 02/02/2021    Lab Results  Component Value Date   TSH 0.47 09/14/2020   Lab Results  Component Value Date   WBC 5.8 02/13/2021   HGB 12.7 02/13/2021   HCT 38.0 02/13/2021   MCV 89.6 02/13/2021   PLT 299 02/13/2021   Lab Results  Component Value Date   NA 138 02/13/2021   K 3.4 (L) 02/13/2021    CO2 25 02/13/2021   GLUCOSE 106 (H) 02/13/2021   BUN 11 02/13/2021   CREATININE 0.72 02/13/2021   BILITOT 0.3 02/02/2021   ALKPHOS 57 02/02/2021   AST 14 02/02/2021   ALT 15 02/02/2021   PROT 6.4 02/02/2021   ALBUMIN 3.5 (L) 02/02/2021   CALCIUM 8.7 (L) 02/13/2021   ANIONGAP 8 02/13/2021   GFR 86.16 06/24/2020   Lab Results  Component Value Date   CHOL 174 02/02/2021   Lab Results  Component Value Date   HDL 62 02/02/2021   Lab Results  Component Value Date   LDLCALC 94 02/02/2021   Lab Results  Component Value Date   TRIG 101 02/02/2021   Lab Results  Component Value Date   CHOLHDL 3.3 09/14/2020   Lab Results  Component Value Date   HGBA1C 5.7 (H) 02/02/2021   Mammogram: Last completed on 01/29/2020. Results were normal. Repeat in 1 year. Due  Pap smear: Last completed on 08/27/2020. Results were normal. Repeat in 3 years.     Assessment & Plan:   Problem List Items Addressed This Visit     HTN (hypertension) - Primary    Well controlled, no changes to meds. Encouraged heart healthy diet such as the DASH diet and exercise as tolerated.        Relevant Orders   CBC   Comprehensive metabolic panel   Lipid panel   TSH   Morbid obesity (HCC)    She reports she was up to 394 and she is exercising more and following the HWW diet but she could not afford to keep       Relevant Orders   Lipid panel   Preventative health care    Patient encouraged to maintain heart healthy diet, regular exercise, adequate sleep. Consider daily probiotics. Take medications as prescribed. MGM ordered today. Labs ordered and reviewed. Referred for screening colonoscopy. Pap was 2021 repeat in 2024.        Hyperlipidemia, mixed    Encourage heart healthy diet such as MIND or DASH diet, increase exercise, avoid trans fats, simple carbohydrates and processed foods, consider a krill or fish or flaxseed oil cap daily.        Relevant Orders   Lipid panel   Pedal edema     Encouraged to elevate feet above heart tid, compression hose, minimize sodium and continue lasix 20-40 mg daily if worsens can consider increasing  doses moving forward.        Back pain    Encouraged moist heat and gentle stretching as tolerated. May try NSAIDs and prescription meds as directed and report if symptoms worsen or seek immediate care. Much better after PT and with weight loss. Encouraged to continue her exercises and can do them in the pool.        Prediabetes    hgba1c acceptable, minimize simple carbs. Increase exercise as tolerated.        Relevant Orders   Lipid panel   Hemoglobin A1c   Insulin, random   Vitamin D deficiency    Supplement and monitor       Relevant Orders   Lipid panel   VITAMIN D 25 Hydroxy (Vit-D Deficiency, Fractures)   Other Visit Diagnoses     Colon cancer screening       Relevant Orders   Ambulatory referral to Gastroenterology   Encounter for screening mammogram for malignant neoplasm of breast       Relevant Orders   MM 3D SCREEN BREAST BILATERAL       No orders of the defined types were placed in this encounter.   Arvil PersonsI, Mozella Rexrode A, MD , personally preformed the services described in this documentation.  All medical record entries made by the scribe were at my direction and in my presence.  I have reviewed the chart and discharge instructions (if applicable) and agree that the record reflects my personal performance and is accurate and complete. 07/05/2021  I,Zite Okoli,acting as a scribe for Danise EdgeStacey Gael Delude, MD.,have documented all relevant documentation on the behalf of Danise EdgeStacey Stefania Goulart, MD,as directed by  Danise EdgeStacey Karisa Nesser, MD while in the presence of Danise EdgeStacey Tc Kapusta, MD.    Danise EdgeStacey Avenir Lozinski, MD

## 2021-07-05 NOTE — Assessment & Plan Note (Signed)
She reports she was up to 394 and she is exercising more and following the HWW diet but she could not afford to keep

## 2021-07-05 NOTE — Assessment & Plan Note (Signed)
Encouraged to elevate feet above heart tid, compression hose, minimize sodium and continue lasix 20-40 mg daily if worsens can consider increasing doses moving forward.

## 2021-07-05 NOTE — Assessment & Plan Note (Signed)
hgba1c acceptable, minimize simple carbs. Increase exercise as tolerated.  

## 2021-07-05 NOTE — Assessment & Plan Note (Signed)
Patient encouraged to maintain heart healthy diet, regular exercise, adequate sleep. Consider daily probiotics. Take medications as prescribed. MGM ordered today. Labs ordered and reviewed. Referred for screening colonoscopy. Pap was 2021 repeat in 2024.

## 2021-07-05 NOTE — Assessment & Plan Note (Signed)
Well controlled, no changes to meds. Encouraged heart healthy diet such as the DASH diet and exercise as tolerated.  °

## 2021-07-05 NOTE — Assessment & Plan Note (Signed)
Encouraged moist heat and gentle stretching as tolerated. May try NSAIDs and prescription meds as directed and report if symptoms worsen or seek immediate care. Much better after PT and with weight loss. Encouraged to continue her exercises and can do them in the pool.

## 2021-07-06 LAB — INSULIN, RANDOM: Insulin: 19.2 u[IU]/mL

## 2021-07-17 ENCOUNTER — Other Ambulatory Visit: Payer: Self-pay | Admitting: Family Medicine

## 2021-08-02 ENCOUNTER — Other Ambulatory Visit: Payer: Self-pay | Admitting: Family Medicine

## 2021-08-02 DIAGNOSIS — R600 Localized edema: Secondary | ICD-10-CM

## 2021-08-08 ENCOUNTER — Other Ambulatory Visit: Payer: Self-pay | Admitting: Family Medicine

## 2021-08-12 ENCOUNTER — Encounter: Payer: Self-pay | Admitting: Family Medicine

## 2021-09-16 ENCOUNTER — Encounter: Payer: Self-pay | Admitting: Family Medicine

## 2021-09-27 ENCOUNTER — Other Ambulatory Visit: Payer: Self-pay | Admitting: Family Medicine

## 2021-09-27 DIAGNOSIS — R600 Localized edema: Secondary | ICD-10-CM

## 2021-10-26 ENCOUNTER — Other Ambulatory Visit: Payer: Self-pay

## 2021-10-26 DIAGNOSIS — N939 Abnormal uterine and vaginal bleeding, unspecified: Secondary | ICD-10-CM

## 2021-10-26 MED ORDER — NORGESTIM-ETH ESTRAD TRIPHASIC 0.18/0.215/0.25 MG-35 MCG PO TABS: 1.0000 | ORAL_TABLET | Freq: Every day | ORAL | 1 refills | Status: DC

## 2021-10-26 NOTE — Progress Notes (Signed)
Patient scheduled for annual exam. Refill given until exam. [name] RN

## 2021-11-09 ENCOUNTER — Telehealth: Payer: Self-pay

## 2021-11-09 ENCOUNTER — Other Ambulatory Visit: Payer: Self-pay | Admitting: Family Medicine

## 2021-11-09 MED ORDER — HYDROCODONE-ACETAMINOPHEN 5-325 MG PO TABS: 1.0000 | ORAL_TABLET | Freq: Three times a day (TID) | ORAL | 0 refills | Status: DC | PRN

## 2021-11-09 NOTE — Telephone Encounter (Signed)
Requesting: hydrocodone Contract: none UDS: none Last Visit: 07/05/21 Next Visit: 01/03/21 Last Refill: 01/08/21  Please Advise

## 2021-11-09 NOTE — Telephone Encounter (Signed)
Pt prior auth was started HYDROcodone-Acetaminophen 5-325MG  tablets   Key: [id number]  Waiting for response

## 2021-11-17 NOTE — Telephone Encounter (Signed)
  Denied on December 4  This request has not been approved. This request is denied because we did not receive enough information from your provider. We contacted your provider but did not receive a response within the required time limit. Please follow up with your provider. Based on the information submitted for review, you did not meet our guideline rules for the requested drug. In order for your request to be approved, your provider would need to show that you have met the guideline rules below. The details below are written in medical language. If you have questions, please contact your provider. In some cases, the requested medication or alternatives offered may have additional approval requirements. Our guideline named OPIOID-NAIVE DAY SUPPLY LIMITATION (reviewed for hydrocodone-acetaminophen) allows approval of the requested drug for a longer day supply when you meet at least ONE of the following conditions:A. You have active cancer.B. You are enrolled in hospice.C. You are receiving palliative care (treatment for comfort from symptoms) or end-of-life care.D. You are a resident of a long-term care facility or intermediate care for intellectually disabled.E. You have sickle cell disease (type of blood disorder).F. You are NOT opioid nave (you have been consistently using opioid pain medications).G. Your doctor confirms (attests) that the prescribed dose of opioids with the requested day supply is intended and medically necessary.Please consult your doctor if you have any questions about this prescription pain medication and the requirements needed for you to obtain an approval for this medication. Your doctor told us told us that you have a diagnosis of back pain. We do not have information showing you meet the criteria listed above. This is why your request is denied. Please work with your doctor to use a different medication or get [place] more information if it will allow [place] to approve this request. A written  notification letter will follow with additional details.

## 2021-11-21 ENCOUNTER — Encounter: Payer: Self-pay | Admitting: Obstetrics & Gynecology

## 2021-11-22 ENCOUNTER — Other Ambulatory Visit: Payer: Self-pay

## 2021-11-22 DIAGNOSIS — N939 Abnormal uterine and vaginal bleeding, unspecified: Secondary | ICD-10-CM

## 2021-11-22 MED ORDER — NORGESTIM-ETH ESTRAD TRIPHASIC 0.18/0.215/0.25 MG-35 MCG PO TABS: 1.0000 | ORAL_TABLET | Freq: Every day | ORAL | 0 refills | Status: DC

## 2021-11-29 ENCOUNTER — Other Ambulatory Visit: Payer: Self-pay

## 2021-11-29 ENCOUNTER — Ambulatory Visit (INDEPENDENT_AMBULATORY_CARE_PROVIDER_SITE_OTHER): Payer: 59 | Admitting: Obstetrics & Gynecology

## 2021-11-29 ENCOUNTER — Encounter: Payer: Self-pay | Admitting: Obstetrics & Gynecology

## 2021-11-29 ENCOUNTER — Telehealth (HOSPITAL_BASED_OUTPATIENT_CLINIC_OR_DEPARTMENT_OTHER): Payer: Self-pay

## 2021-11-29 VITALS — BP 162/87 | HR 93 | Ht 68.0 in | Wt 390.0 lb

## 2021-11-29 DIAGNOSIS — Z1231 Encounter for screening mammogram for malignant neoplasm of breast: Secondary | ICD-10-CM | POA: Diagnosis not present

## 2021-11-29 DIAGNOSIS — Z01419 Encounter for gynecological examination (general) (routine) without abnormal findings: Secondary | ICD-10-CM

## 2021-11-29 DIAGNOSIS — N939 Abnormal uterine and vaginal bleeding, unspecified: Secondary | ICD-10-CM | POA: Diagnosis not present

## 2021-11-29 MED ORDER — NORGESTIM-ETH ESTRAD TRIPHASIC 0.18/0.215/0.25 MG-35 MCG PO TABS: 1.0000 | ORAL_TABLET | Freq: Every day | ORAL | 4 refills | Status: DC

## 2021-11-29 NOTE — Progress Notes (Signed)
Patient presents for annual exam

## 2021-11-29 NOTE — Progress Notes (Signed)
Subjective:     Christina Mendez is a 45 y.o. female here for a routine exam.  Current complaints: none.   Has issues with her truck not working this am. Her bleeding is imporved with the OCPs. She denies new sx.    Gynecologic History No LMP recorded. Contraception: OCP (estrogen/progesterone) Last Pap: 08/27/2020. Results were: normal Last mammogram: 01/29/2020. Results were: normal  Obstetric History OB History  Gravida Para Term Preterm AB Living  0 0 0 0 0 0  SAB IAB Ectopic Multiple Live Births  0 0 0 0 0    The following portions of the patient's history were reviewed and updated as appropriate: allergies, current medications, past family history, past medical history, past social history, past surgical history, and problem list.  Review of Systems Pertinent items are noted in HPI.    Objective:  BP (!) 162/87    Pulse 93    Ht 5\' 8"  (1.727 m)    Wt (!) 390 lb (176.9 kg)    BMI 59.30 kg/m   General Appearance:    Alert, cooperative, no distress, appears stated age  Head:    Normocephalic, without obvious abnormality, atraumatic  Eyes:    conjunctiva/corneas clear, EOM's intact, both eyes  Ears:    Normal external ear canals, both ears  Nose:   Nares normal, septum midline, mucosa normal, no drainage    or sinus tenderness  Throat:   Lips, mucosa, and tongue normal; teeth and gums normal  Neck:   Supple, symmetrical, trachea midline, no adenopathy;    thyroid:  no enlargement/tenderness/nodules  Back:     Symmetric, no curvature, ROM normal, no CVA tenderness  Lungs:     respirations unlabored  Chest Wall:    No tenderness or deformity   Heart:    Regular rate and rhythm  Breast Exam:    No tenderness, masses, or nipple abnormality  Abdomen:     Soft, non-tender, bowel sounds active all four quadrants,    no masses, no organomegaly  Genitalia:    Normal female without lesion, discharge or tenderness     Extremities:   Extremities normal, atraumatic, no cyanosis or  edema  Pulses:   2+ and symmetric all extremities  Skin:   Skin color, texture, turgor normal, no rashes or lesions     Assessment:    Healthy female exam.    Plan:   Christina Mendez was seen today for gynecologic exam.  Diagnoses and all orders for this visit:  Well female exam with routine gynecological exam  Breast cancer screening by mammogram -     Cancel: [place] Digital Screening; Future -     MM 3D SCREEN BREAST BILATERAL; Future  Abnormal uterine bleeding (AUB) -     Norgestimate-Ethinyl Estradiol Triphasic (ORTHO TRI-CYCLEN, 28,) 0.18/0.215/0.25 MG-35 MCG tablet; Take 1 tablet by mouth daily.    Christina Mendez, M.D., 01-09-1983

## 2021-12-15 ENCOUNTER — Other Ambulatory Visit: Payer: Self-pay

## 2021-12-15 ENCOUNTER — Ambulatory Visit (HOSPITAL_BASED_OUTPATIENT_CLINIC_OR_DEPARTMENT_OTHER)
Admission: RE | Admit: 2021-12-15 | Discharge: 2021-12-15 | Disposition: A | Discharge status: Home / Self Care | Payer: 59 | Source: Ambulatory Visit | Attending: Obstetrics & Gynecology | Admitting: Obstetrics & Gynecology

## 2021-12-15 ENCOUNTER — Encounter (HOSPITAL_BASED_OUTPATIENT_CLINIC_OR_DEPARTMENT_OTHER): Payer: Self-pay

## 2021-12-15 DIAGNOSIS — Z1231 Encounter for screening mammogram for malignant neoplasm of breast: Secondary | ICD-10-CM | POA: Diagnosis present

## 2022-01-03 ENCOUNTER — Ambulatory Visit (INDEPENDENT_AMBULATORY_CARE_PROVIDER_SITE_OTHER): Payer: 59 | Admitting: Family Medicine

## 2022-01-03 ENCOUNTER — Encounter: Payer: Self-pay | Admitting: Family Medicine

## 2022-01-03 VITALS — BP 136/78 | HR 70 | Temp 97.7°F | Resp 16 | Ht 68.0 in | Wt 398.8 lb

## 2022-01-03 DIAGNOSIS — M545 Low back pain, unspecified: Secondary | ICD-10-CM

## 2022-01-03 DIAGNOSIS — M25561 Pain in right knee: Secondary | ICD-10-CM

## 2022-01-03 DIAGNOSIS — G8929 Other chronic pain: Secondary | ICD-10-CM

## 2022-01-03 DIAGNOSIS — E782 Mixed hyperlipidemia: Secondary | ICD-10-CM | POA: Diagnosis not present

## 2022-01-03 DIAGNOSIS — Z79899 Other long term (current) drug therapy: Secondary | ICD-10-CM

## 2022-01-03 DIAGNOSIS — I10 Essential (primary) hypertension: Secondary | ICD-10-CM | POA: Diagnosis not present

## 2022-01-03 DIAGNOSIS — E559 Vitamin D deficiency, unspecified: Secondary | ICD-10-CM

## 2022-01-03 DIAGNOSIS — R7303 Prediabetes: Secondary | ICD-10-CM | POA: Diagnosis not present

## 2022-01-03 DIAGNOSIS — J0100 Acute maxillary sinusitis, unspecified: Secondary | ICD-10-CM

## 2022-01-03 DIAGNOSIS — K219 Gastro-esophageal reflux disease without esophagitis: Secondary | ICD-10-CM

## 2022-01-03 DIAGNOSIS — F1190 Opioid use, unspecified, uncomplicated: Secondary | ICD-10-CM

## 2022-01-03 LAB — COMPREHENSIVE METABOLIC PANEL
ALT: 16 U/L (ref 0–35)
AST: 15 U/L (ref 0–37)
Albumin: 3.7 g/dL (ref 3.5–5.2)
Alkaline Phosphatase: 49 U/L (ref 39–117)
BUN: 12 mg/dL (ref 6–23)
CO2: 27 mEq/L (ref 19–32)
Calcium: 8.8 mg/dL (ref 8.4–10.5)
Chloride: 102 mEq/L (ref 96–112)
Creatinine, Ser: 0.74 mg/dL (ref 0.40–1.20)
GFR: 96.63 mL/min (ref >60.00)
Glucose, Bld: 93 mg/dL (ref 70–99)
Potassium: 4.2 mEq/L (ref 3.5–5.1)
Sodium: 138 mEq/L (ref 135–145)
Total Bilirubin: 0.4 mg/dL (ref 0.2–1.2)
Total Protein: 6.6 g/dL (ref 6.0–8.3)

## 2022-01-03 LAB — LIPID PANEL
Cholesterol: 178 mg/dL (ref 0–200)
HDL: 64.4 mg/dL (ref >39.00)
LDL Cholesterol: 86 mg/dL (ref 0–99)
NonHDL: 113.55
Total CHOL/HDL Ratio: 3
Triglycerides: 136 mg/dL (ref 0.0–149.0)
VLDL: 27.2 mg/dL (ref 0.0–40.0)

## 2022-01-03 LAB — CBC WITH DIFFERENTIAL/PLATELET
Basophils Absolute: 0 10*3/uL (ref 0.0–0.1)
Basophils Relative: 0.7 % (ref 0.0–3.0)
Eosinophils Absolute: 0.1 10*3/uL (ref 0.0–0.7)
Eosinophils Relative: 1.5 % (ref 0.0–5.0)
HCT: 37.9 % (ref 36.0–46.0)
Hemoglobin: 12.7 g/dL (ref 12.0–15.0)
Lymphocytes Relative: 26.4 % (ref 12.0–46.0)
Lymphs Abs: 1.5 10*3/uL (ref 0.7–4.0)
MCHC: 33.4 g/dL (ref 30.0–36.0)
MCV: 87.9 fl (ref 78.0–100.0)
Monocytes Absolute: 0.4 10*3/uL (ref 0.1–1.0)
Monocytes Relative: 7.5 % (ref 3.0–12.0)
Neutro Abs: 3.7 10*3/uL (ref 1.4–7.7)
Neutrophils Relative %: 63.9 % (ref 43.0–77.0)
Platelets: 285 10*3/uL (ref 150.0–400.0)
RBC: 4.31 Mil/uL (ref 3.87–5.11)
RDW: 14 % (ref 11.5–15.5)
WBC: 5.8 10*3/uL (ref 4.0–10.5)

## 2022-01-03 LAB — TSH: TSH: 0.76 u[IU]/mL (ref 0.35–5.50)

## 2022-01-03 LAB — HEMOGLOBIN A1C: Hgb A1c MFr Bld: 5.7 % (ref 4.6–6.5)

## 2022-01-03 MED ORDER — HYDROCODONE-ACETAMINOPHEN 5-325 MG PO TABS: 1.0000 | ORAL_TABLET | Freq: Three times a day (TID) | ORAL | 0 refills | Status: DC | PRN

## 2022-01-03 NOTE — Assessment & Plan Note (Signed)
Encouraged moist heat and gentle stretching as tolerated. May try NSAIDs and prescription meds as directed and report if symptoms worsen or seek immediate care, consider ice, Lidocaine and biofreeze topically

## 2022-01-03 NOTE — Assessment & Plan Note (Signed)
Well controlled, no changes to meds. Encouraged heart healthy diet such as the DASH diet and exercise as tolerated.  °

## 2022-01-03 NOTE — Assessment & Plan Note (Signed)
Encourage heart healthy diet such as MIND or DASH diet, increase exercise, avoid trans fats, simple carbohydrates and processed foods, consider a krill or fish or flaxseed oil cap daily.  °

## 2022-01-03 NOTE — Assessment & Plan Note (Signed)
hgba1c acceptable, minimize simple carbs. Increase exercise as tolerated.  

## 2022-01-03 NOTE — Patient Instructions (Signed)
[id number] (weekly) and Saxenda injectables. Check with insurance regarding coverage   DASH Eating Plan DASH stands for Dietary Approaches to Stop Hypertension. The DASH eating plan is a healthy eating plan that has been shown to: Reduce high blood pressure (hypertension). Reduce your risk for type 2 diabetes, heart disease, and stroke. Help with weight loss. What are tips for following this plan? Reading food labels Check food labels for the amount of salt (sodium) per serving. Choose foods with less than 5 percent of the Daily Value of sodium. Generally, foods with less than 300 milligrams (mg) of sodium per serving fit into this eating plan. To find whole grains, look for the word "whole" as the first word in the ingredient list. Shopping Buy products labeled as "low-sodium" or "no salt added." Buy fresh foods. Avoid canned foods and pre-made or frozen meals. Cooking Avoid adding salt when cooking. Use salt-free seasonings or herbs instead of table salt or sea salt. Check with your health care provider or pharmacist before using salt substitutes. Do not fry foods. Cook foods using healthy methods such as baking, boiling, grilling, roasting, and broiling instead. Cook with heart-healthy oils, such as olive, canola, avocado, soybean, or sunflower oil. Meal planning  Eat a balanced diet that includes: 4 or more servings of fruits and 4 or more servings of vegetables each day. Try to fill one-half of your plate with fruits and vegetables. 6-8 servings of whole grains each day. Less than 6 oz (170 g) of lean meat, poultry, or fish each day. A 3-oz (85-g) serving of meat is about the same size as a deck of cards. One egg equals 1 oz (28 g). 2-3 servings of low-fat dairy each day. One serving is 1 cup (237 mL). 1 serving of nuts, seeds, or beans 5 times each week. 2-3 servings of heart-healthy fats. Healthy fats called omega-3 fatty acids are found in foods such as walnuts, flaxseeds, fortified  milks, and eggs. These fats are also found in cold-water fish, such as sardines, salmon, and mackerel. Limit how much you eat of: Canned or prepackaged foods. Food that is high in trans fat, such as some fried foods. Food that is high in saturated fat, such as fatty meat. Desserts and other sweets, sugary drinks, and other foods with added sugar. Full-fat dairy products. Do not salt foods before eating. Do not eat more than 4 egg yolks a week. Try to eat at least 2 vegetarian meals a week. Eat more home-cooked food and less restaurant, buffet, and fast food. Lifestyle When eating at a restaurant, ask that your food be prepared with less salt or no salt, if possible. If you drink alcohol: Limit how much you use to: 0-1 drink a day for women who are not pregnant. 0-2 drinks a day for men. Be aware of how much alcohol is in your drink. In the [place] one drink equals one 12 oz bottle of beer (355 mL), one 5 oz glass of wine (148 mL), or one 1 oz glass of hard liquor (44 mL). General information Avoid eating more than 2,300 mg of salt a day. If you have hypertension, you may need to reduce your sodium intake to 1,500 mg a day. Work with your health care provider to maintain a healthy body weight or to lose weight. Ask what an ideal weight is for you. Get at least 30 minutes of exercise that causes your heart to beat faster (aerobic exercise) most days of the week. Activities may include walking,  swimming, or biking. Work with your health care provider or dietitian to adjust your eating plan to your individual calorie needs. What foods should I eat? Fruits All fresh, dried, or frozen fruit. Canned fruit in natural juice (without added sugar). Vegetables Fresh or frozen vegetables (raw, steamed, roasted, or grilled). Low-sodium or reduced-sodium tomato and vegetable juice. Low-sodium or reduced-sodium tomato sauce and tomato paste. Low-sodium or reduced-sodium canned  vegetables. Grains Whole-grain or whole-wheat bread. Whole-grain or whole-wheat pasta. Brown rice. Orpah Cobbatmeal. Quinoa. Bulgur. Whole-grain and low-sodium cereals. Pita bread. Low-fat, low-sodium crackers. Whole-wheat flour tortillas. Meats and other proteins Skinless chicken or Malawiturkey. Ground chicken or [place]iturkey. Pork with fat trimmed off. Fish and seafood. Egg whites. Dried beans, peas, or lentils. Unsalted nuts, nut butters, and seeds. Unsalted canned beans. Lean cuts of beef with fat trimmed off. Low-sodium, lean precooked or cured meat, such as sausages or meat loaves. Dairy Low-fat (1%) or fat-free (skim) milk. Reduced-fat, low-fat, or fat-free cheeses. Nonfat, low-sodium ricotta or cottage cheese. Low-fat or nonfat yogurt. Low-fat, low-sodium cheese. Fats and oils Soft margarine without trans fats. Vegetable oil. Reduced-fat, low-fat, or light mayonnaise and salad dressings (reduced-sodium). Canola, safflower, olive, avocado, soybean, and sunflower oils. Avocado. Seasonings and condiments Herbs. Spices. Seasoning mixes without salt. Other foods Unsalted popcorn and pretzels. Fat-free sweets. The items listed above may not be a complete list of foods and beverages you can eat. Contact a dietitian for more information. What foods should I avoid? Fruits Canned fruit in a light or heavy syrup. Fried fruit. Fruit in cream or butter sauce. Vegetables Creamed or fried vegetables. Vegetables in a cheese sauce. Regular canned vegetables (not low-sodium or reduced-sodium). Regular canned tomato sauce and paste (not low-sodium or reduced-sodium). Regular tomato and vegetable juice (not low-sodium or reduced-sodium). Rosita FirePickles. Olives. Grains Baked goods made with fat, such as croissants, muffins, or some breads. Dry pasta or rice meal packs. Meats and other proteins Fatty cuts of meat. Ribs. Fried meat. Tomasa BlaseBacon. Bologna, salami, and other precooked or cured meats, such as sausages or meat loaves. Fat from  the back of a pig (fatback). Bratwurst. Salted nuts and seeds. Canned beans with added salt. Canned or smoked fish. Whole eggs or egg yolks. Chicken or Malaw[place]urkey with skin. Dairy Whole or 2% milk, cream, and half-and-half. Whole or full-fat cream cheese. Whole-fat or sweetened yogurt. Full-fat cheese. Nondairy creamers. Whipped toppings. Processed cheese and cheese spreads. Fats and oils Butter. Stick margarine. Lard. Shortening. Ghee. Bacon fat. Tropical oils, such as coconut, palm kernel, or palm oil. Seasonings and condiments Onion salt, garlic salt, seasoned salt, table salt, and sea salt. Worcestershire sauce. Tartar sauce. Barbecue sauce. Teriyaki sauce. Soy sauce, including reduced-sodium. Steak sauce. Canned and packaged gravies. Fish sauce. Oyster sauce. Cocktail sauce. Store-bought horseradish. Ketchup. Mustard. Meat flavorings and tenderizers. Bouillon cubes. Hot sauces. Pre-made or packaged marinades. Pre-made or packaged taco seasonings. Relishes. Regular salad dressings. Other foods Salted popcorn and pretzels. The items listed above may not be a complete list of foods and beverages you should avoid. Contact a dietitian for more information. Where to find more information National Heart, Lung, and Blood Institute: PopSteam.iswww.nhlbi.nih.gov American Heart Association: www.heart.org Academy of Nutrition and Dietetics: www.eatright.org National Kidney Foundation: www.kidney.org Summary The DASH eating plan is a healthy eating plan that has been shown to reduce high blood pressure (hypertension). It may also reduce your risk for type 2 diabetes, heart disease, and stroke. When on the DASH eating plan, aim to eat more fresh fruits and vegetables,  whole grains, lean proteins, low-fat dairy, and heart-healthy fats. With the DASH eating plan, you should limit salt (sodium) intake to 2,300 mg a day. If you have hypertension, you may need to reduce your sodium intake to 1,500 mg a day. Work with your  health care provider or dietitian to adjust your eating plan to your individual calorie needs. This information is not intended to replace advice given to you by your health care provider. Make sure you discuss any questions you have with your health care provider. Document Revised: 11/01/2019 Document Reviewed: 11/01/2019 Elsevier Patient Education  2022 [employer].

## 2022-01-03 NOTE — Assessment & Plan Note (Signed)
Recent episode treated over the counter and better now

## 2022-01-03 NOTE — Assessment & Plan Note (Signed)
Encouraged moist heat and gentle stretching as tolerated. May try NSAIDs and prescription meds as directed and report if symptoms worsen or seek immediate care. Uses Hydrocodone prn sparingly

## 2022-01-03 NOTE — Progress Notes (Signed)
Subjective:   By signing my name below, I, Zite Okoli, attest that this documentation has been prepared under the direction and in the presence of Mosie Lukes, MD. 01/03/2022    Patient ID: Christina Mendez, female    DOB: October 23, 1976, 46 y.o.   MRN: [id number]  Chief Complaint  Patient presents with   6 months follow up    HPI Patient is in today for an office visit.  She reports that 5-325 mg  norco/vicodin is helping to manage the back pain and right knee pain. She was diagnosed with arthritis few years ago but is using a knee brace and a cane sometimes when climbing stairs. She is requesting for a refill on it.  She is interested in weight loss medications.  She reports that after she eats at night, she always develops a cough. Adds that it is not affected by what type of food she eats. She does not think it is severe enough.  She has received the flu vaccine. She has 4 Covid-19 vaccines.   Past Medical History:  Diagnosis Date   Acid reflux    Allergic state 03/02/2012   Allergy last couple of years   seasonal   Anxiety    Back pain    Cervical cancer screening 03/02/2012   Chest pain    Chicken pox 3rd grade   GERD (gastroesophageal reflux disease)    GERD (gastroesophageal reflux disease)    Heartburn    HTN (hypertension) 12/09/2011   Hyperlipidemia    Hypertension    Inflamed skin tag 03/02/2012   Menorrhagia 05/04/2015   OAB (overactive bladder)    Obese 12/09/2011   Obesity    OSA (obstructive sleep apnea)    Ovarian cyst 08/17/2012   Palpitations    Pedal edema 07/11/2017   Sinusitis 01/08/2012   Swollen ankles    Swollen feet    Vaginal Pap smear, abnormal     Past Surgical History:  Procedure Laterality Date   cyst removed  46 yr old   benign, abdominal   HERNIA REPAIR      Family History  Problem Relation Age of Onset   Fibromyalgia Mother    Diabetes Mother        type 2   Hypertension Mother    Thyroid disease Mother    Cancer  Father 26       prostate cancer   Diabetes Father        type 2   Heart disease Father 23       2 MI   Hypertension Father    Heart attack Father 51       X 2   Stroke Father        mini strokes   GER disease Father    Hyperlipidemia Father    Cholelithiasis Father    Venous thrombosis Father        in liver   Colon polyps Father    Sleep apnea Father    Obesity Father    Hypertension Brother    Venous thrombosis Brother        on spine   Cancer Maternal Uncle        prostate cancer   Cancer Maternal Grandfather        stomach   Hypertension Paternal Grandmother    Diabetes Paternal Grandmother        type 2   Heart attack Paternal Grandmother    Heart disease Paternal Grandmother  Heart attack Paternal Grandfather    Heart disease Paternal Grandfather    Hypertension Paternal Grandfather     Social History   Socioeconomic History   Marital status: Single    Spouse name: Not on file   Number of children: Not on file   Years of education: Not on file   Highest education level: Bachelor's degree (e.g., BA, AB, BS)  Occupational History   Occupation: Direct Care Provider  Tobacco Use   Smoking status: Never   Smokeless tobacco: Never  Vaping Use   Vaping Use: Never used  Substance and Sexual Activity   Alcohol use: Yes    Comment: OCCASIONALLY   Drug use: No   Sexual activity: Yes    Partners: Male    Comment: No dietary restrictions, lives with Mom, Dad and brother and niece.  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 2-3 tabs po qd prn     aspirin EC 81 MG tablet Take 81 mg by mouth daily. Swallow whole.     cholecalciferol (VITAMIN D) 25 MCG (1000 UNIT) tablet Take 1,000 Units by mouth daily.     [employer] Omega-3 Krill Oil 500 MG CAPS Take by mouth.     cyclobenzaprine (FLEXERIL) 10 MG tablet Take 1 tablet (10 mg total) by mouth 3 (three) times daily as needed for muscle spasms. 90 tablet 2   fluticasone (FLONASE) 50 MCG/ACT nasal spray Place into both nostrils daily.     furosemide (LASIX) 20 MG tablet take 1 to 2 tablets by mouth once daily as needed for edema 60 tablet 1   losartan (COZAAR) 25 MG tablet TAKE ONE TABLET BY MOUTH ONE TIME DAILY 90 tablet 1   metoprolol succinate (TOPROL-XL) 25 MG 24 hr tablet TAKE ONE TABLET BY MOUTH ONE TIME DAILY 90 tablet 1   Multiple Vitamins-Minerals (WOMENS MULTI VITAMIN & MINERAL PO) Take by mouth.     Norgestimate-Ethinyl Estradiol Triphasic (ORTHO TRI-CYCLEN, 28,) 0.18/0.215/0.25 MG-35 MCG tablet Take 1 tablet by mouth daily. 3 tablet 4   omeprazole (PRILOSEC) 40 MG capsule TAKE ONE CAPSULE BY MOUTH ONE TIME DAILY 90 capsule 1   Probiotic Product (PROBIOTIC-10 PO) Take by mouth.     sucralfate (CARAFATE) 1 GM/10ML suspension Take 10 mLs (1 g total) by mouth 4 (four) times daily -  with meals and at bedtime. 420 mL 0   HYDROcodone-acetaminophen (NORCO/VICODIN) 5-325 MG tablet Take 1 tablet by mouth 3 (three) times daily as needed for moderate pain. 30 tablet 0   Vitamin D, Ergocalciferol, (DRISDOL) 1.25 MG (50000 UNIT) CAPS capsule Take 1 capsule (50,000 Units total) by mouth every 7 (seven) days. 12 capsule 0   No facility-administered medications prior to visit.    Allergies  Allergen Reactions   Skelaxin Hypertension    Review of Systems  Constitutional:  Negative for fever and malaise/fatigue.  HENT:  Negative for congestion.   Eyes:  Negative for redness.  Respiratory:  Negative for shortness of  breath.   Cardiovascular:  Negative for chest pain, palpitations and leg swelling.  Gastrointestinal:  Negative for abdominal pain, blood in stool and nausea.  Genitourinary:  Negative for dysuria and frequency.  Musculoskeletal:  Positive for back pain and joint pain (right knee). Negative for falls.  Skin:  Negative for rash.  Neurological:  Negative for dizziness, loss of consciousness and headaches.  Endo/Heme/Allergies:  Negative for polydipsia.  Psychiatric/Behavioral:  Negative for depression. The patient is not nervous/anxious.       Objective:    Physical Exam Constitutional:      General: She is not in acute distress.    Appearance: She is well-developed.  HENT:     Head: Normocephalic and atraumatic.  Eyes:     Conjunctiva/sclera: Conjunctivae normal.  Neck:     Thyroid: No thyromegaly.  Cardiovascular:     Rate and Rhythm: Normal rate and regular rhythm.     Heart sounds: Normal heart sounds. No murmur heard. Pulmonary:     Effort: Pulmonary effort is normal. No respiratory distress.     Breath sounds: Normal breath sounds.  Abdominal:     General: Bowel sounds are normal. There is no distension.     Palpations: Abdomen is soft. There is no mass.     Tenderness: There is no abdominal tenderness.  Musculoskeletal:     Cervical back: Neck supple.  Lymphadenopathy:     Cervical: No cervical adenopathy.  Skin:    General: Skin is warm and dry.  Neurological:     Mental Status: She is alert and oriented to person, place, and time.  Psychiatric:        Behavior: Behavior normal.    BP 136/78    Pulse 70    Temp 97.7 F (36.5 C)    Resp 16    Ht 5\' 8"  (1.727 m)    Wt (!) 398 lb 12.8 oz (180.9 kg)    LMP 12/19/2021    SpO2 97%    BMI 60.64 kg/m  Wt Readings from Last 3 Encounters:  01/03/22 (!) 398 lb 12.8 oz (180.9 kg)  11/29/21 (!) 390 lb (176.9 kg)  07/05/21 (!) 378 lb 9.6 oz (171.7 kg)    Diabetic Foot Exam - Simple   No data filed    Lab Results   Component Value Date   WBC 4.4 07/05/2021   HGB 13.4 07/05/2021   HCT 40.0 07/05/2021   PLT 262.0 07/05/2021   GLUCOSE 97 07/05/2021   CHOL 181 07/05/2021   TRIG 141.0 07/05/2021   HDL 69.60 07/05/2021   LDLCALC 84 07/05/2021   ALT 15 07/05/2021   AST 14 07/05/2021   NA 138 07/05/2021   K 4.5 07/05/2021   CL 103 07/05/2021   CREATININE 0.74 07/05/2021   BUN 7 07/05/2021   CO2 28 07/05/2021   TSH 0.65 07/05/2021   HGBA1C 5.7 07/05/2021    Lab Results  Component Value Date   TSH 0.65 07/05/2021   Lab Results  Component Value Date   WBC 4.4 07/05/2021   HGB 13.4 07/05/2021   HCT 40.0 07/05/2021   MCV 89.0 07/05/2021   PLT 262.0 07/05/2021   Lab Results  Component Value Date   NA 138 07/05/2021   K 4.5 07/05/2021   CO2 28 07/05/2021   GLUCOSE 97 07/05/2021   BUN 7 07/05/2021   CREATININE 0.74 07/05/2021   BILITOT 0.4 07/05/2021   ALKPHOS 53 07/05/2021  AST 14 07/05/2021   ALT 15 07/05/2021   PROT 6.8 07/05/2021   ALBUMIN 3.7 07/05/2021   CALCIUM 8.7 07/05/2021   ANIONGAP 8 02/13/2021   GFR 96.97 07/05/2021   Lab Results  Component Value Date   CHOL 181 07/05/2021   Lab Results  Component Value Date   HDL 69.60 07/05/2021   Lab Results  Component Value Date   LDLCALC 84 07/05/2021   Lab Results  Component Value Date   TRIG 141.0 07/05/2021   Lab Results  Component Value Date   CHOLHDL 3 07/05/2021   Lab Results  Component Value Date   HGBA1C 5.7 07/05/2021       Assessment & Plan:   Problem List Items Addressed This Visit     HTN (hypertension) - Primary    Well controlled, no changes to meds. Encouraged heart healthy diet such as the DASH diet and exercise as tolerated.       Relevant Orders   CBC with Differential/Platelet   Comprehensive metabolic panel   Lipid panel   TSH   Morbid obesity ([place])    Encouraged DASH or MIND diet, decrease po intake and increase exercise as tolerated. Needs 7-8 hours of sleep nightly. Avoid  trans fats, eat small, frequent meals every 4-5 hours with lean proteins, complex carbs and healthy fats. Minimize simple carbs, high fat foods and processed foods      Sinusitis    Recent episode treated over the counter and better now      Hyperlipidemia, mixed    Encourage heart healthy diet such as MIND or DASH diet, increase exercise, avoid trans fats, simple carbohydrates and processed foods, consider a krill or fish or flaxseed oil cap daily.       Relevant Orders   CBC with Differential/Platelet   Comprehensive metabolic panel   Lipid panel   TSH   Esophageal reflux   Back pain    Encouraged moist heat and gentle stretching as tolerated. May try NSAIDs and prescription meds as directed and report if symptoms worsen or seek immediate care. Uses Hydrocodone prn sparingly      Relevant Medications   HYDROcodone-acetaminophen (NORCO/VICODIN) 5-325 MG tablet   Prediabetes    hgba1c acceptable, minimize simple carbs. Increase exercise as tolerated.       Relevant Orders   Hemoglobin A1c   Vitamin D deficiency    Supplement and monitor      Relevant Orders   Vitamin D 1,25 dihydroxy   Right knee pain    Encouraged moist heat and gentle stretching as tolerated. May try NSAIDs and prescription meds as directed and report if symptoms worsen or seek immediate care, consider ice, Lidocaine and biofreeze topically      Other Visit Diagnoses     High risk medication use       Relevant Orders   Drug Monitoring Panel [phone] , Urine   Chronic, continuous use of opioids       Relevant Orders   Drug Monitoring Panel [phone] , Urine        Meds ordered this encounter  Medications   HYDROcodone-acetaminophen (NORCO/VICODIN) 5-325 MG tablet    Sig: Take 1 tablet by mouth 3 (three) times daily as needed for moderate pain.    Dispense:  30 tablet    Refill:  0    I,Zite Okoli,acting as a scribe for Penni Homans, MD.,have documented all relevant documentation on the behalf  of Penni Homans, MD,as directed by  Penni Homans,  MD while in the presence of Penni Homans, MD.   I, Mosie Lukes, MD. , personally preformed the services described in this documentation.  All medical record entries made by the scribe were at my direction and in my presence.  I have reviewed the chart and discharge instructions (if applicable) and agree that the record reflects my personal performance and is accurate and complete. 01/03/2022

## 2022-01-03 NOTE — Assessment & Plan Note (Signed)
Supplement and monitor 

## 2022-01-03 NOTE — Assessment & Plan Note (Signed)
Encouraged DASH or MIND diet, decrease po intake and increase exercise as tolerated. Needs 7-8 hours of sleep nightly. Avoid trans fats, eat small, frequent meals every 4-5 hours with lean proteins, complex carbs and healthy fats. Minimize simple carbs, high fat foods and processed foods 

## 2022-01-06 LAB — VITAMIN D 1,25 DIHYDROXY
Vitamin D 1, 25 (OH)2 Total: 71 pg/mL (ref 18–72)
Vitamin D2 1, 25 (OH)2: <8 pg/mL
Vitamin D3 1, 25 (OH)2: 71 pg/mL

## 2022-01-07 LAB — DRUG MONITORING PANEL 376104, URINE
Amphetamines: NEGATIVE ng/mL (ref <500)
Barbiturates: NEGATIVE ng/mL (ref <300)
Benzodiazepines: NEGATIVE ng/mL (ref <100)
Cocaine Metabolite: NEGATIVE ng/mL (ref <150)
Codeine: NEGATIVE ng/mL (ref <50)
Desmethyltramadol: NEGATIVE ng/mL (ref <100)
Hydrocodone: 69 ng/mL — ABNORMAL HIGH (ref <50)
Hydromorphone: NEGATIVE ng/mL (ref <50)
Morphine: NEGATIVE ng/mL (ref <50)
Norhydrocodone: NEGATIVE ng/mL (ref <50)
Opiates: POSITIVE ng/mL — AB (ref <100)
Oxycodone: NEGATIVE ng/mL (ref <100)
Tramadol: NEGATIVE ng/mL (ref <100)

## 2022-01-07 LAB — DM TEMPLATE

## 2022-01-16 ENCOUNTER — Other Ambulatory Visit: Payer: Self-pay | Admitting: Family Medicine

## 2022-01-27 ENCOUNTER — Other Ambulatory Visit: Payer: Self-pay | Admitting: Family Medicine

## 2022-02-03 ENCOUNTER — Other Ambulatory Visit: Payer: Self-pay | Admitting: Family Medicine

## 2022-02-14 IMAGING — US US TRANSVAGINAL NON-OB
1 series · 13 of 25 positions shown · non-contrast
Comparison: 07/19/2004

CLINICAL DATA: Abnormal uterine bleeding, irregular menses lasting
for over 1 week, LMP 07/22/2020 through 08/22/2020

EXAM:
TRANSABDOMINAL AND TRANSVAGINAL ULTRASOUND OF PELVIS
TECHNIQUE: Both transabdominal and transvaginal ultrasound examinations of the
pelvis were performed. Transabdominal technique was performed for
global imaging of the pelvis including uterus, ovaries, adnexal
regions, and pelvic cul-de-sac. It was necessary to proceed with
endovaginal exam following the transabdominal exam to visualize the
uterus, endometrium, and to characterize a lesion in the ovary.

[Series 1: us transvaginal non-ob · 13 of 72 slices shown]
[im 1/72]
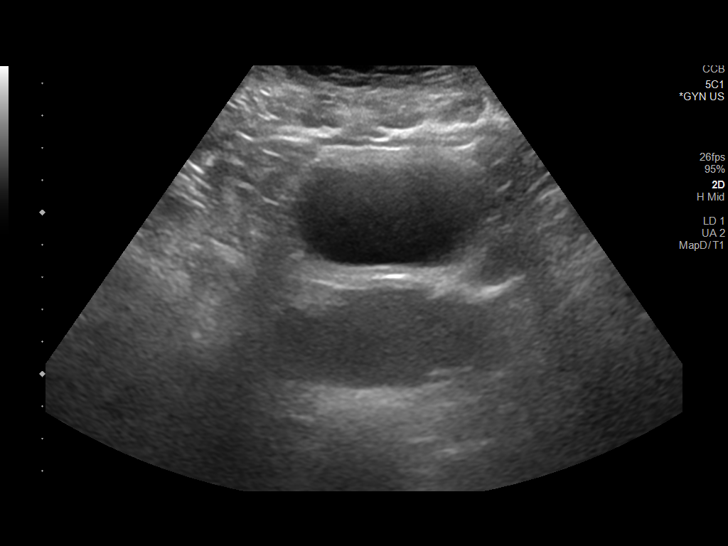
[im 6/72]
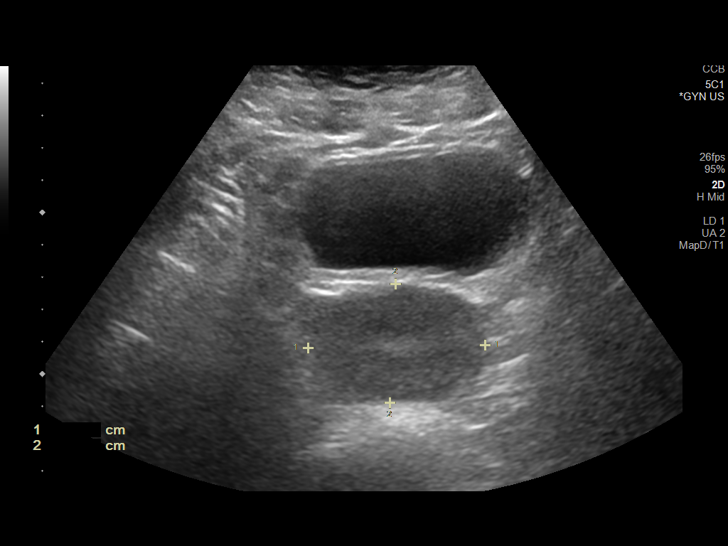
[im 12/72]
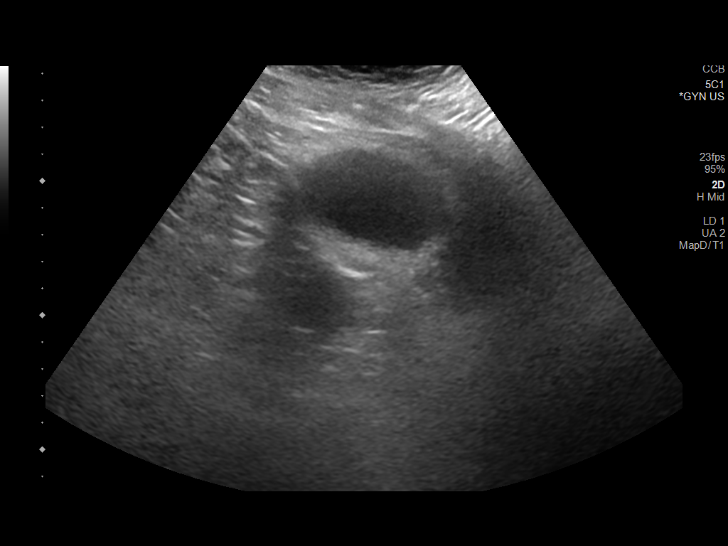
[im 18/72]
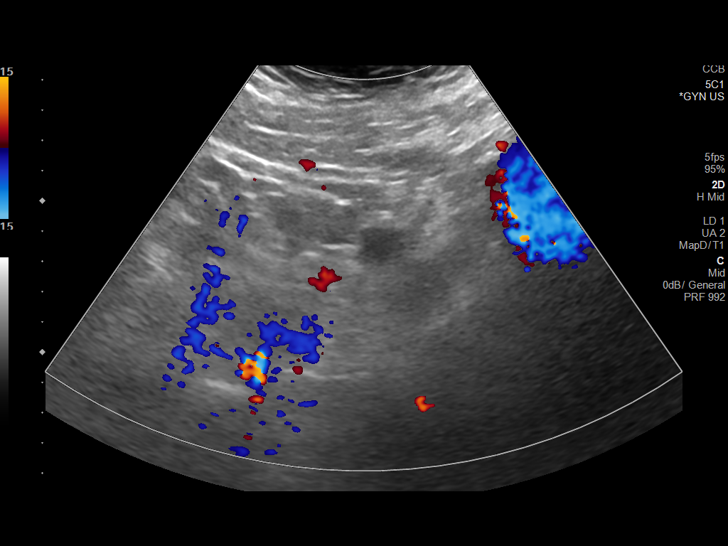
[im 24/72]
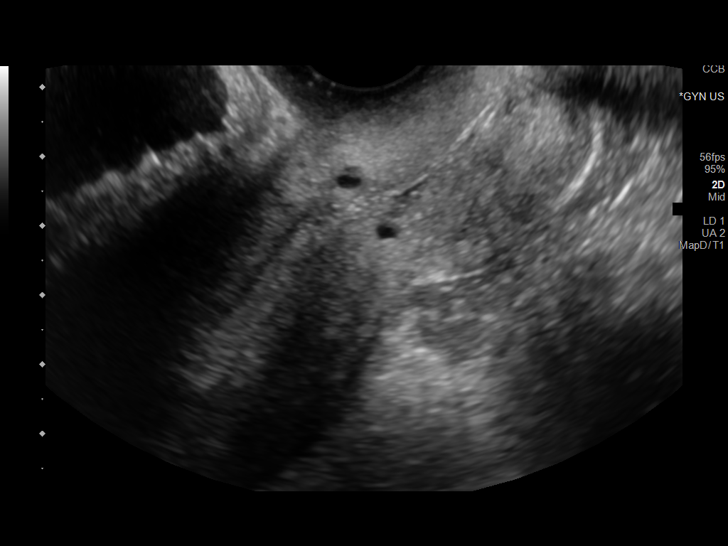
[im 30/72]
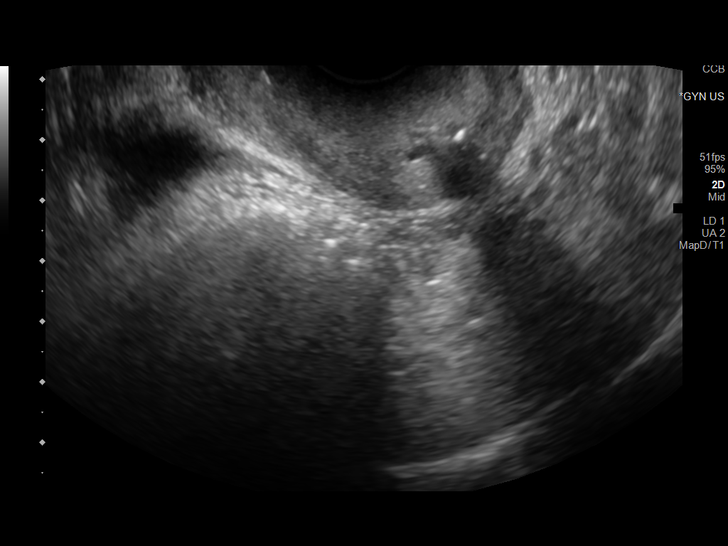
[im 36/72]
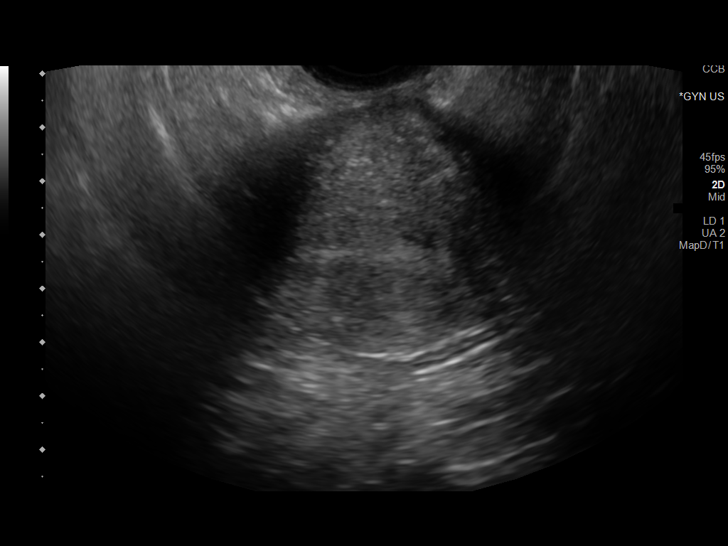
[im 42/72]
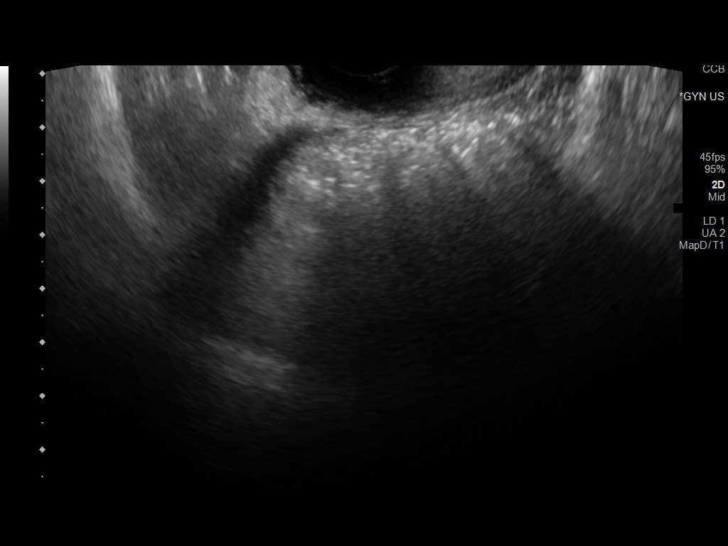
[im 48/72]
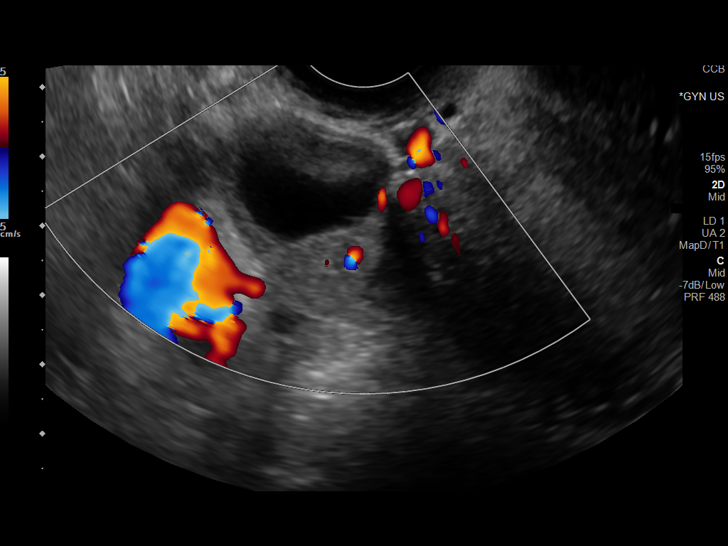
[im 54/72]
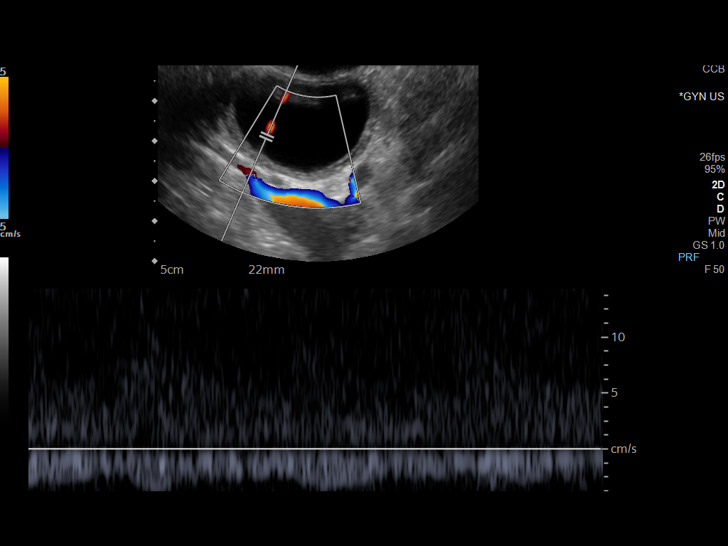
[im 60/72]
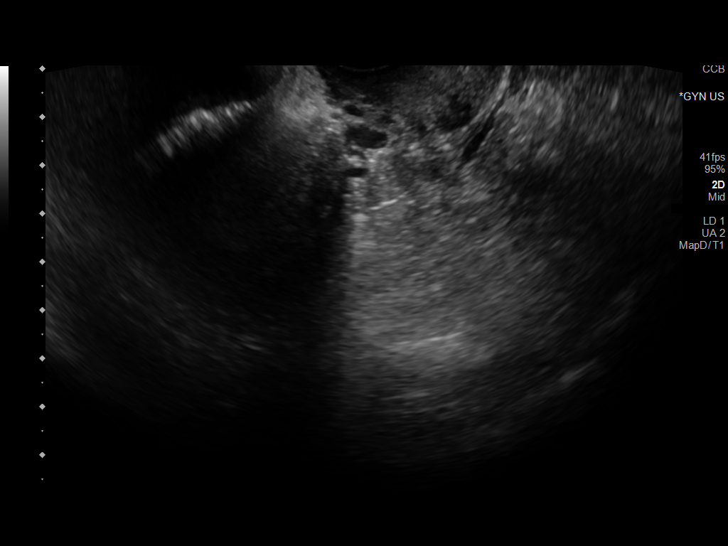
[im 66/72]
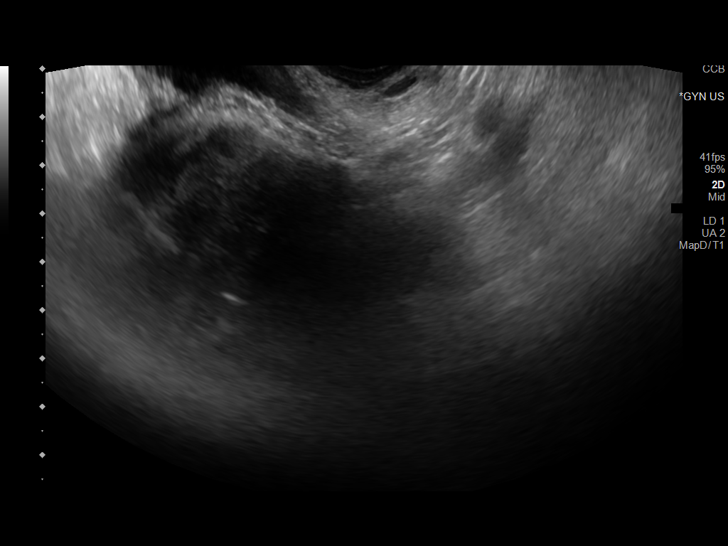
[im 72/72]
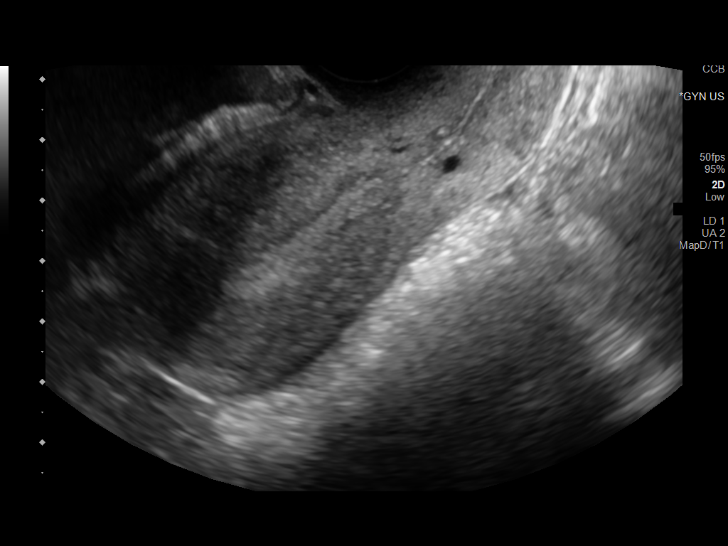

[13 of 25 positions shown; findings below may reference images not displayed]

FINDINGS: Uterus

Measurements: 8.9 x 5.1 x 4.2 cm = volume: 100 mL. Anteverted.
Normal morphology without mass. Nabothian cysts in cervix.

Endometrium

Thickness: 7 mm.  No endometrial fluid or focal abnormality

Right ovary

Measurements: 3.5 x 3.9 x 3.0 cm = volume: 21 mL. Complex cystic
mass RIGHT ovary 3.4 x 3.7 x 2.3 cm containing a thin septation
which contains internal blood flow on color Doppler imaging. No
mural nodularity or wall thickening. No additional masses.

Left ovary

Not visualized, likely obscured by bowel

Other findings

No free pelvic fluid.  No adnexal masses.
IMPRESSION: Unremarkable uterus, endometrial complex and RIGHT ovary.

Nonvisualization of LEFT ovary.

Mildly complicated cystic mass RIGHT ovary 3.7 cm greatest size
containing a thin septation which contains internal blood flow on
color Doppler imaging; no other complicating features seen within
this lesion.

This lesion is likely benign but recommend followup transvaginal
ultrasound recommended in 12 weeks to assess stability.

## 2022-02-16 ENCOUNTER — Other Ambulatory Visit: Payer: Self-pay | Admitting: Family Medicine

## 2022-02-17 MED ORDER — HYDROCODONE-ACETAMINOPHEN 5-325 MG PO TABS: 1.0000 | ORAL_TABLET | Freq: Three times a day (TID) | ORAL | 0 refills | Status: DC | PRN

## 2022-02-17 NOTE — Telephone Encounter (Signed)
[place] ?

## 2022-02-20 ENCOUNTER — Other Ambulatory Visit: Payer: Self-pay | Admitting: Family Medicine

## 2022-02-20 DIAGNOSIS — R600 Localized edema: Secondary | ICD-10-CM

## 2022-02-22 ENCOUNTER — Ambulatory Visit: Payer: 59 | Admitting: Gastroenterology

## 2022-03-14 ENCOUNTER — Encounter: Payer: Self-pay | Admitting: Family Medicine

## 2022-03-15 ENCOUNTER — Other Ambulatory Visit: Payer: Self-pay | Admitting: Family Medicine

## 2022-03-15 DIAGNOSIS — G4730 Sleep apnea, unspecified: Secondary | ICD-10-CM

## 2022-04-02 ENCOUNTER — Other Ambulatory Visit: Payer: Self-pay | Admitting: Family Medicine

## 2022-04-04 ENCOUNTER — Ambulatory Visit (INDEPENDENT_AMBULATORY_CARE_PROVIDER_SITE_OTHER): Payer: 59 | Admitting: Adult Health

## 2022-04-04 ENCOUNTER — Encounter: Payer: Self-pay | Admitting: Adult Health

## 2022-04-04 DIAGNOSIS — G4733 Obstructive sleep apnea (adult) (pediatric): Secondary | ICD-10-CM | POA: Diagnosis not present

## 2022-04-04 MED ORDER — HYDROCODONE-ACETAMINOPHEN 5-325 MG PO TABS: 1.0000 | ORAL_TABLET | Freq: Three times a day (TID) | ORAL | 0 refills | Status: DC | PRN

## 2022-04-04 NOTE — Progress Notes (Signed)
? ?'@Patient'$  ID: Christina Mendez, female    DOB: [birth year], 46 y.o.   MRN: [id number] ? ?Chief Complaint  ?Patient presents with  ? Consult  ? ? ?Referring provider: ?Mosie Lukes, MD ? ?HPI: ?46 year old female seen for sleep consult April 04, 2022 to reestablish for severe obstructive sleep apnea. ?Patient is a patient of Dr. Elsworth Soho with a known history of severe sleep apnea with previous sleep study showing AHI at 80/hour and SPO2 low at 77%. ? ?TEST/EVENTS :  ?May 11, 2014 sleep study showed AHI at 80/hour and SPO2 low at 77%. ?During the baseline portion,there were 100 obstructive apneas, 0 central apneas, 0 mixed apneas and 64 hypopneas with apnea -hypopnea index of 80 events per hour. There was no relation to sleep stage or body position. ?Due to this degree of respiratory disturbance CPAP was initiated at 4 cm and titrated to find a level of 13 cm due to persistent events during supine sleep. At the level of 12 cm for 178 minutes of sleep including her 91 minutes of REM sleep, 0 apneas and 0 hypopneas were noted. Titration was optimal . The lowest desaturation was 77% during non-REM sleep  ? ?04/04/2022 Sleep consult  ?Patient presents for sleep consult today.  Patient has known severe obstructive sleep apnea.  She was previously seen by Dr. Elsworth Soho, last seen in 2015.  Patient was seen for sleep consult in 2015.  Was set up for sleep study that showed AHI at 80/hour with SPO2 low at 77%.  She was started on nocturnal CPAP.  Patient says that from the first night she started CPAP she was significantly improved.  She felt like it was life-changing for her.  Patient says she is wear her CPAP every single night.  However 2 weeks ago her CPAP stopped working.  She has not been able to wear it.  She is definitely having increased symptom burden off of CPAP.  With daytime sleepiness snoring and restless sleep.  She wants to get started back on her CPAP immediately.  She needs a new order to go to her DME company.   Patient says she typically goes to bed about about 9 PM to midnight.  Typically takes about 15 minutes to go to sleep.  Is up about 3-4 times at night.  And typically gets up about 630 to 7:30 AM.  She does not operate heavy machinery.  Her weight is up about 20 pounds over the last 2 years.  Current weight is at 407 pounds.  With a BMI at 63.  Patient has no symptoms suspicious for cataplexy, sleep paralysis or narcolepsy.  Does not take any sleep aids.  Caffeine intake is 2 cups of coffee daily.  Says her CPAP is set at 12 cm H2O.  Epworth score today is 5 out of 24.  Patient says she has minimal sleepiness during the daytime as long as she wears her CPAP.  Mainly gets a little sleepy if she is an active. ?She currently is using a full facemask.  Patient says she has tried a nasal mask in the past and it did not work.  Patient says she has been getting her CPAP supplies for the last 8 years through her DME company they just auto refill and she is had no problems. ? ? ?Medical history ?Hypertension, hyperlipidemia, chronic allergies, GERD, allergic rhinitis ? ?Surgical history none ? ?Social history.  Patient lives with her parents.  She currently works as a Secretary/administrator, also  works as a Physicist, medical.  Has no children.  And is single.  Is a never smoker.  Drinks alcohol socially.  No drug use. ? ?Family history positive for allergies, asthma, heart disease, rheumatoid arthritis, cancer with prostate cancer in her father. ? ? ? ? ? ?Allergies  ?Allergen Reactions  ? Skelaxin Hypertension  ? ? ?Immunization History  ?Administered Date(s) Administered  ? DTaP 03/03/1975, 04/02/1975, 05/02/1975, 01/01/1979, 02/13/1981  ? Hepatitis B, adult 11/30/2015, 01/01/2016  ? IPV 03/03/1975, 04/02/1975, 05/02/1975, 01/01/1979, 02/13/1981  ? Influenza Split 12/09/2011, 08/17/2012  ? Influenza, Seasonal, Injecte, Preservative Fre 09/05/2015  ? Influenza,inj,Quad PF,6+ Mos 08/21/2014, 08/13/2019, 08/31/2020  ?  Influenza-Unspecified 09/11/2013, 09/08/2021  ? MMR 03/26/1976, 12/23/2010  ? PFIZER(Purple Top)SARS-COV-2 Vaccination 02/21/2020, 03/18/2020, 12/19/2020  ? PPD Test 12/21/2011, 03/31/2014, 11/27/2015  ? Pension scheme manager 8yrs & up 09/08/2021  ? Td 02/20/2003, 03/31/2014  ? Tdap 02/20/2003  ? ? ?Past Medical History:  ?Diagnosis Date  ? Acid reflux   ? Allergic state 03/02/2012  ? Allergy last couple of years  ? seasonal  ? Anxiety   ? Back pain   ? Cervical cancer screening 03/02/2012  ? Chest pain   ? Chicken pox 3rd grade  ? GERD (gastroesophageal reflux disease)   ? GERD (gastroesophageal reflux disease)   ? Heartburn   ? HTN (hypertension) 12/09/2011  ? Hyperlipidemia   ? Hypertension   ? Inflamed skin tag 03/02/2012  ? Menorrhagia 05/04/2015  ? OAB (overactive bladder)   ? Obese 12/09/2011  ? Obesity   ? OSA (obstructive sleep apnea)   ? Ovarian cyst 08/17/2012  ? Palpitations   ? Pedal edema 07/11/2017  ? Sinusitis 01/08/2012  ? Swollen ankles   ? Swollen feet   ? Vaginal Pap smear, abnormal   ? ? ?Tobacco History: ?Social History  ? ?Tobacco Use  ?Smoking Status Never  ?Smokeless Tobacco Never  ? ?Counseling given: Not Answered ? ? ?Outpatient Medications Prior to Visit  ?Medication Sig Dispense Refill  ? acetaminophen (TYLENOL) 500 MG tablet Take 500 mg by mouth every 6 (six) hours as needed. 2-3 tabs po qd prn    ? aspirin EC 81 MG tablet Take 81 mg by mouth daily. Swallow whole.    ? cholecalciferol (VITAMIN D) 25 MCG (1000 UNIT) tablet Take 1,000 Units by mouth daily.    ? [employer] Omega-3 Krill Oil 500 MG CAPS Take by mouth.    ? cyclobenzaprine (FLEXERIL) 10 MG tablet Take 1 tablet (10 mg total) by mouth 3 (three) times daily as needed for muscle spasms. 90 tablet 2  ? fluticasone (FLONASE) 50 MCG/ACT nasal spray Place into both nostrils daily.    ? furosemide (LASIX) 20 MG tablet TAKE 1 TO 2 TABLETS BY MOUTH ONCE DAILY AS NEEDED FOR EDEMA 60 tablet 3  ? HYDROcodone-acetaminophen  (NORCO/VICODIN) 5-325 MG tablet Take 1 tablet by mouth 3 (three) times daily as needed for moderate pain. 30 tablet 0  ? losartan (COZAAR) 25 MG tablet TAKE ONE TABLET BY MOUTH ONE TIME DAILY 90 tablet 0  ? metoprolol succinate (TOPROL-XL) 25 MG 24 hr tablet TAKE ONE TABLET BY MOUTH ONE TIME DAILY 90 tablet 1  ? Multiple Vitamins-Minerals (WOMENS MULTI VITAMIN & MINERAL PO) Take by mouth.    ? Norgestimate-Ethinyl Estradiol Triphasic (ORTHO TRI-CYCLEN, 28,) 0.18/0.215/0.25 MG-35 MCG tablet Take 1 tablet by mouth daily. 3 tablet 4  ? omeprazole (PRILOSEC) 40 MG capsule TAKE ONE CAPSULE BY MOUTH ONE TIME  DAILY 90 capsule 0  ? Probiotic Product (PROBIOTIC-10 PO) Take by mouth.    ? sucralfate (CARAFATE) 1 GM/10ML suspension Take 10 mLs (1 g total) by mouth 4 (four) times daily -  with meals and at bedtime. (Patient not taking: Reported on 04/04/2022) 420 mL 0  ? ?No facility-administered medications prior to visit.  ? ? ? ?Review of Systems:  ? ?Constitutional:   No  weight loss, night sweats,  Fevers, chills, ?+ fatigue, or  lassitude. ? ?HEENT:   No headaches,  Difficulty swallowing,  Tooth/dental problems, or  Sore throat,  ?              No sneezing, itching, ear ache, nasal congestion, post nasal drip,  ? ?CV:  No chest pain,  Orthopnea, PND, anasarca, dizziness, palpitations, syncope.  ? ?GI  No heartburn, indigestion, abdominal pain, nausea, vomiting, diarrhea, change in bowel habits, loss of appetite, bloody stools.  ? ?Resp: No shortness of breath with exertion or at rest.  No excess mucus, no productive cough,  No non-productive cough,  No coughing up of blood.  No change in color of mucus.  No wheezing.  No chest wall deformity ? ?Skin: no rash or lesions. ? ?GU: no dysuria, change in color of urine, no urgency or frequency.  No flank pain, no hematuria  ? ?MS:  No joint pain or swelling.  No decreased range of motion.  No back pain. ? ? ? ?Physical Exam ? ?BP 140/90 (BP Location: Left Arm, Patient Position:  Sitting, Cuff Size: Large)   Pulse 72   Temp 98.1 ?F (36.7 ?C) (Oral)   Ht $R'5\' 7"'nD$  (1.702 m)   Wt (!) 407 lb 9.6 oz (184.9 kg)   SpO2 98%   BMI 63.84 kg/m?  ? ?GEN: A/Ox3; pleasant , NAD, well nourished  ?  ?HEENT:  [place]/AT,  EAC

## 2022-04-04 NOTE — Assessment & Plan Note (Signed)
Healthy weight loss discussed 

## 2022-04-04 NOTE — Telephone Encounter (Signed)
Requesting: Norco 5-325 ?Contract: 12/2321  ?UDS: 01/03/22 ?Last Visit: 01/03/22 ?Next Visit: 07/14/22   ?Last Refill: 02/17/22 ? ?Please Advise  ?

## 2022-04-04 NOTE — Assessment & Plan Note (Signed)
Severe obstructive sleep apnea with previous sleep study 2015 showing AHI at 80/hour.  Patient reports significant clinical improvement on CPAP.  Typically wears her CPAP at 6 to 8 hours every night.  Unfortunately her CPAP machine stopped working about 2 weeks ago.  She will need a order for new CPAP.  We will continue CPAP at 12 cm H2O. ?Patient education on sleep apnea and CPAP care. ? ?- discussed how weight can impact sleep and risk for sleep disordered breathing ?- discussed options to assist with weight loss: combination of diet modification, cardiovascular and strength training exercises ?  ?- had an extensive discussion regarding the adverse health consequences related to untreated sleep disordered breathing ?- specifically discussed the risks for hypertension, coronary artery disease, cardiac dysrhythmias, cerebrovascular disease, and diabetes ?- lifestyle modification discussed ?  ?- discussed how sleep disruption can increase risk of accidents, particularly when driving ?- safe driving practices were discussed ?  ? ?Plan  ?Patient Instructions  ?Order for new CPAP.  ?Restart as soon as gets new CPAP  ?Wear CPAP all night long ?Work on healthy weight loss ?Do not drive if sleepy ?Follow up in 3 months and  As needed   ? ?  ? ?

## 2022-04-04 NOTE — Patient Instructions (Signed)
Order for new CPAP.  ?Restart as soon as gets new CPAP  ?Wear CPAP all night long ?Work on healthy weight loss ?Do not drive if sleepy ?Follow up in 3 months and  As needed   ? ?

## 2022-04-04 NOTE — Addendum Note (Signed)
Addended by: Delrae Rend on: 04/04/2022 04:18 PM ? ? Modules accepted: Orders ? ?

## 2022-04-13 ENCOUNTER — Other Ambulatory Visit: Payer: Self-pay | Admitting: Family Medicine

## 2022-04-14 DIAGNOSIS — G4733 Obstructive sleep apnea (adult) (pediatric): Secondary | ICD-10-CM | POA: Diagnosis not present

## 2022-04-26 ENCOUNTER — Other Ambulatory Visit: Payer: Self-pay | Admitting: Family Medicine

## 2022-05-15 DIAGNOSIS — G4733 Obstructive sleep apnea (adult) (pediatric): Secondary | ICD-10-CM | POA: Diagnosis not present

## 2022-05-24 ENCOUNTER — Other Ambulatory Visit: Payer: Self-pay | Admitting: Family Medicine

## 2022-05-25 ENCOUNTER — Encounter: Payer: Self-pay | Admitting: Obstetrics & Gynecology

## 2022-05-25 ENCOUNTER — Encounter: Payer: Self-pay | Admitting: Family Medicine

## 2022-05-25 MED ORDER — HYDROCODONE-ACETAMINOPHEN 5-325 MG PO TABS: 1.0000 | ORAL_TABLET | Freq: Three times a day (TID) | ORAL | 0 refills | Status: DC | PRN

## 2022-05-25 NOTE — Telephone Encounter (Signed)
Requesting: hydrocodone 5-325mg   Contract: 01/03/22 UDS: 01/03/22 Last Visit: 01/03/22 Next Visit: None Last Refill: 04/04/22 #30 and 0RF  Please Advise

## 2022-06-14 DIAGNOSIS — G4733 Obstructive sleep apnea (adult) (pediatric): Secondary | ICD-10-CM | POA: Diagnosis not present

## 2022-06-20 ENCOUNTER — Encounter: Payer: Self-pay | Admitting: Obstetrics & Gynecology

## 2022-06-29 ENCOUNTER — Ambulatory Visit (INDEPENDENT_AMBULATORY_CARE_PROVIDER_SITE_OTHER): Payer: BC Managed Care – PPO | Admitting: Obstetrics & Gynecology

## 2022-06-29 ENCOUNTER — Encounter: Payer: Self-pay | Admitting: General Practice

## 2022-06-29 ENCOUNTER — Encounter: Payer: Self-pay | Admitting: Obstetrics & Gynecology

## 2022-06-29 ENCOUNTER — Other Ambulatory Visit (HOSPITAL_COMMUNITY)
Admission: RE | Admit: 2022-06-29 | Discharge: 2022-06-29 | Disposition: A | Discharge status: Home / Self Care | Payer: BC Managed Care – PPO | Source: Ambulatory Visit | Attending: Obstetrics & Gynecology | Admitting: Obstetrics & Gynecology

## 2022-06-29 VITALS — BP 129/83 | HR 83 | Wt 395.0 lb

## 2022-06-29 DIAGNOSIS — N939 Abnormal uterine and vaginal bleeding, unspecified: Secondary | ICD-10-CM

## 2022-06-29 LAB — POCT URINE PREGNANCY: Preg Test, Ur: NEGATIVE

## 2022-06-29 NOTE — Progress Notes (Signed)
History:  46 y.o. G0P0000 here today for endo bx. Pt has had AUB and is on OCPs.   She started bleeding in June and bled for almost 1 month. No bleeding at present. No other assoc sx.   The following portions of the patient's history were reviewed and updated as appropriate: allergies, current medications, past family history, past medical history, past social history, past surgical history and problem list.  Review of Systems:  Pertinent items are noted in HPI.    Objective:  Physical Exam Blood pressure 129/83, pulse 83, weight (!) 395 lb (179.2 kg), last menstrual period 06/05/2022.  CONSTITUTIONAL: Well-developed, well-nourished female in no acute distress.  HENT:  Normocephalic, atraumatic EYES: Conjunctivae and EOM are normal. No scleral icterus.  NECK: Normal range of motion SKIN: Skin is warm and dry. No rash noted. Not diaphoretic.No pallor. NEUROLGIC: Alert and oriented to person, place, and time. Normal coordination.   Abd: Soft, nontender and nondistended Pelvic: Normal appearing external genitalia; normal appearing vaginal mucosa and cervix.  Normal discharge.  Small uterus, no other palpable masses, no uterine or adnexal tenderness  GYN procedure:  The indications for endometrial biopsy were reviewed.   Risks of the biopsy including cramping, bleeding, infection, uterine perforation, inadequate specimen and need for additional procedures  were discussed. The patient states she understands and agrees to undergo procedure today. Consent was signed. Time out was performed. Urine HCG was negative. A sterile speculum was placed in the patient's vagina and the cervix was prepped with Betadine. A single-toothed tenaculum was placed on the anterior lip of the cervix to stabilize it. The 3 mm pipelle was introduced into the endometrial cavity without difficulty to a depth of 10cm, and a moderate amount of tissue was obtained and sent to pathology. The instruments were removed from the  patient's vagina. Minimal bleeding from the cervix was noted. The patient tolerated the procedure well. Routine post-procedure instructions were given to the patient. The patient will follow up to review the results and for further management.     Labs and Imaging 09/14/2020 CLINICAL DATA:  Abnormal uterine bleeding, irregular menses lasting for over 1 week, LMP 07/22/2020 through 08/22/2020   EXAM: TRANSABDOMINAL AND TRANSVAGINAL ULTRASOUND OF PELVIS   TECHNIQUE: Both transabdominal and transvaginal ultrasound examinations of the pelvis were performed. Transabdominal technique was performed for global imaging of the pelvis including uterus, ovaries, adnexal regions, and pelvic cul-de-sac. It was necessary to proceed with endovaginal exam following the transabdominal exam to visualize the uterus, endometrium, and to characterize a lesion in the ovary.   COMPARISON:  07/19/2004   FINDINGS: Uterus   Measurements: 8.9 x 5.1 x 4.2 cm = volume: 100 mL. Anteverted. Normal morphology without mass. Nabothian cysts in cervix.   Endometrium   Thickness: 7 mm.  No endometrial fluid or focal abnormality   Right ovary   Measurements: 3.5 x 3.9 x 3.0 cm = volume: 21 mL. Complex cystic mass RIGHT ovary 3.4 x 3.7 x 2.3 cm containing a thin septation which contains internal blood flow on color Doppler imaging. No mural nodularity or wall thickening. No additional masses.   Left ovary   Not visualized, likely obscured by bowel   Other findings   No free pelvic fluid.  No adnexal masses.   IMPRESSION: Unremarkable uterus, endometrial complex and RIGHT ovary.   Nonvisualization of LEFT ovary.   Mildly complicated cystic mass RIGHT ovary 3.7 cm greatest size containing a thin septation which contains internal blood flow on color  Doppler imaging; no other complicating features seen within this lesion.   This lesion is likely benign but recommend followup transvaginal ultrasound  recommended in 12 weeks to assess stability.      Assessment & Plan:  Diagnoses and all orders for this visit:  Abnormal uterine bleeding (AUB) -     POCT urine pregnancy -     Surgical pathology   F/u endo bx results.  Keep same dosage of OCPs for now.   Khadir Roam L. Harraway-Smith, M.D., [facility]

## 2022-07-04 ENCOUNTER — Ambulatory Visit: Payer: 59 | Admitting: Adult Health

## 2022-07-05 ENCOUNTER — Telehealth: Payer: Self-pay

## 2022-07-05 NOTE — Telephone Encounter (Signed)
-----   Message from Willodean Rosenthal, MD sent at 07/05/2022  9:11 AM EDT ----- Please call pt. Her endo bx was negative.   Thx,  Clh-S

## 2022-07-05 NOTE — Telephone Encounter (Signed)
Patient called back and made aware that endometrial biopsy was negative. [name]  RN

## 2022-07-08 LAB — SURGICAL PATHOLOGY

## 2022-07-11 ENCOUNTER — Encounter: Payer: Self-pay | Admitting: Obstetrics & Gynecology

## 2022-07-12 ENCOUNTER — Telehealth: Payer: Self-pay

## 2022-07-12 ENCOUNTER — Ambulatory Visit (INDEPENDENT_AMBULATORY_CARE_PROVIDER_SITE_OTHER): Payer: BC Managed Care – PPO | Admitting: Family

## 2022-07-12 ENCOUNTER — Encounter: Payer: Self-pay | Admitting: Family

## 2022-07-12 ENCOUNTER — Other Ambulatory Visit: Payer: Self-pay | Admitting: Obstetrics & Gynecology

## 2022-07-12 VITALS — BP 149/74 | HR 74 | Temp 98.4°F | Resp 16 | Ht 68.0 in | Wt >= 6400 oz

## 2022-07-12 DIAGNOSIS — R7303 Prediabetes: Secondary | ICD-10-CM | POA: Diagnosis not present

## 2022-07-12 DIAGNOSIS — Z1211 Encounter for screening for malignant neoplasm of colon: Secondary | ICD-10-CM | POA: Diagnosis not present

## 2022-07-12 DIAGNOSIS — I10 Essential (primary) hypertension: Secondary | ICD-10-CM

## 2022-07-12 DIAGNOSIS — N939 Abnormal uterine and vaginal bleeding, unspecified: Secondary | ICD-10-CM

## 2022-07-12 DIAGNOSIS — Z0000 Encounter for general adult medical examination without abnormal findings: Secondary | ICD-10-CM | POA: Diagnosis not present

## 2022-07-12 LAB — BASIC METABOLIC PANEL
BUN: 11 mg/dL (ref 6–23)
CO2: 28 mEq/L (ref 19–32)
Calcium: 8.4 mg/dL (ref 8.4–10.5)
Chloride: 105 mEq/L (ref 96–112)
Creatinine, Ser: 0.73 mg/dL (ref 0.40–1.20)
GFR: 97.86 mL/min (ref >60.00)
Glucose, Bld: 102 mg/dL — ABNORMAL HIGH (ref 70–99)
Potassium: 4.1 mEq/L (ref 3.5–5.1)
Sodium: 140 mEq/L (ref 135–145)

## 2022-07-12 LAB — HEMOGLOBIN A1C: Hgb A1c MFr Bld: 6 % (ref 4.6–6.5)

## 2022-07-12 MED ORDER — WEGOVY 0.25 MG/0.5ML ~~LOC~~ SOAJ: 0.2500 mg | SUBCUTANEOUS | 0 refills | Status: DC

## 2022-07-12 MED ORDER — MEGESTROL ACETATE 40 MG PO TABS: 40.0000 mg | ORAL_TABLET | Freq: Two times a day (BID) | ORAL | 5 refills | Status: DC

## 2022-07-12 NOTE — Telephone Encounter (Signed)
PA initiated for Wegovy.

## 2022-07-12 NOTE — Patient Instructions (Signed)
Please schedule a follow up visit and dental exam.

## 2022-07-12 NOTE — Telephone Encounter (Signed)
Please call pt.   We will stop the OCPs and start Megace to better control the bleeding. I have written the Rx.  Please let her know that the bleeding will not stop immediately but, this should give her better relief.   Also let her know that her endo bx was WNL is this is probably due to perimenopause.   I can see her back in 6-8 weeks.   Thanks   Clh-S     Communicated that above message to the patient. Patient scheduled in seven weeks with Dr. Erin Fulling for follow up. [name]  RN

## 2022-07-12 NOTE — Assessment & Plan Note (Signed)
BP is elevated. She has not taken her AM medication today. Advised pt to take medication now and then we will recheck in 3-4 weeks at her follow up. She is to take her AM meds prior to coming to that visit.  BP Readings from Last 3 Encounters:  07/12/22 (!) 149/74  06/29/22 129/83  04/04/22 140/90

## 2022-07-12 NOTE — Assessment & Plan Note (Addendum)
Wt Readings from Last 3 Encounters:  07/12/22 (!) 403 lb (182.8 kg)  06/29/22 (!) 395 lb (179.2 kg)  04/04/22 (!) 407 lb 9.6 oz (184.9 kg)   We spoke at length about focusing on fresh fruits, veggies and lean proteins. Discussed importance of preparing food at home and meal prepping.  Will also see if we can get [facility] approved for her to start. Discussed risks/common side effects.

## 2022-07-12 NOTE — Progress Notes (Signed)
Subjective:   By signing my name below, I, Cassell Clement, attest that this documentation has been prepared under the direction and in the presence of Birdie Sons, NP 07/12/2022     Patient ID: Christina Mendez, female    DOB: 1976/04/27, 46 y.o.   MRN: [id number]  Chief Complaint  Patient presents with   Annual Exam         HPI Patient is in today for a comprehensive physical exam   Blood Pressure: She reports that she has not taken her blood pressure medications this morning BP Readings from Last 3 Encounters:  07/12/22 (!) 149/74  06/29/22 129/83  04/04/22 140/90   Pulse Readings from Last 3 Encounters:  07/12/22 74  06/29/22 83  04/04/22 72   Weight Loss: She is struggling to lose weight. She was previously on a weight loss program but due to finances, had to discontinue services. A typical breakfast for her is [place] around 8 am. A lunch is mostly salads or soups which she consumes around 1 or 2 pm. She states that she eats out a lot during lunch times. She does not snack much during the afternoons. For dinner, she mostly eats out. She enjoys drinking Coke and drinks water with lemon. She currently lives with her sister and at home, they usually eat their own things. Her sister is not overweight. For exercise, she does not walk much due to her back pain. She has not tried any of the weight loss injectables. She denies of any family history of thyroid cancer.  Wt Readings from Last 3 Encounters:  07/12/22 (!) 403 lb (182.8 kg)  06/29/22 (!) 395 lb (179.2 kg)  04/04/22 (!) 407 lb 9.6 oz (184.9 kg)   Menstruation: She is still menstruating. She reports that her bleeding has been lasting for about two weeks. She is working closely with her GYN about this.  Joint Pain: She reports that her knee occasionally twinges Eczema: She reports that she has occasional eczema flare-ups on her neck which she treats.   She denies having any fever, new muscle pain, joint  pain , new moles, rash, congestion, sinus pain, sore throat, palpations, cough, SOB ,wheezing,n/v/d constipation, blood in stool, dysuria, frequency, hematuria, depression, anxiety, headaches at this time  Social history: She reports no recent surgeries. She denies of any changes to her family history.  Colonoscopy: She is interested in being referred for a colonoscopy.  Pap Smear: Last completed on 08/27/2020. She is regularly seeing Dr. Erin Fulling and reports that she is scheduled to get a pap smear in December.  Mammogram: Last completed on 12/15/2021 Immunizations: She is UTD on tetanus vaccine and has received 3 Covid-19 vaccines.  Exercise: She is not regularly exercising. She does not have access to a gym or membership currently.  Dental: She is not UTD on dental exams.  Vision: She is not UTD on vision exams.   Health Maintenance Due  Topic Date Due   COLONOSCOPY (Pts 45-42yrs Insurance coverage will need to be confirmed)  Never done   INFLUENZA VACCINE  07/12/2022    Past Medical History:  Diagnosis Date   Acid reflux    Allergic state 03/02/2012   Allergy last couple of years   seasonal   Anxiety    Back pain    Cervical cancer screening 03/02/2012   Chest pain    Chicken pox 3rd grade   GERD (gastroesophageal reflux disease)    GERD (gastroesophageal reflux disease)  Heartburn    HTN (hypertension) 12/09/2011   Hyperlipidemia    Hypertension    Inflamed skin tag 03/02/2012   Menorrhagia 05/04/2015   OAB (overactive bladder)    Obese 12/09/2011   Obesity    OSA (obstructive sleep apnea)    Ovarian cyst 08/17/2012   Palpitations    Pedal edema 07/11/2017   Sinusitis 01/08/2012   Swollen ankles    Swollen feet    Vaginal Pap smear, abnormal     Past Surgical History:  Procedure Laterality Date   cyst removed  46 yr old   benign, abdominal   HERNIA REPAIR      Family History  Problem Relation Age of Onset   Fibromyalgia Mother    Diabetes Mother         type 2   Hypertension Mother    Thyroid disease Mother    Cancer Father 78       prostate cancer   Diabetes Father        type 2   Heart disease Father 43       2 MI   Hypertension Father    Heart attack Father 90       X 2   Stroke Father        mini strokes   GER disease Father    Hyperlipidemia Father    Cholelithiasis Father    Venous thrombosis Father        in liver   Colon polyps Father    Sleep apnea Father    Obesity Father    Hypertension Brother    Venous thrombosis Brother        on spine   Cancer Maternal Grandfather        stomach   Hypertension Paternal Grandmother    Diabetes Paternal Grandmother        type 2   Heart attack Paternal Grandmother    Heart disease Paternal Grandmother    Heart attack Paternal Grandfather    Heart disease Paternal Grandfather    Hypertension Paternal Grandfather    Cancer Maternal Uncle        prostate cancer    Social History   Socioeconomic History   Marital status: Single    Spouse name: Not on file   Number of children: Not on file   Years of education: Not on file   Highest education level: Bachelor's degree (e.g., BA, AB, BS)  Occupational History   Occupation: Direct Care Provider  Tobacco Use   Smoking status: Never   Smokeless tobacco: Never  Vaping Use   Vaping Use: Never used  Substance and Sexual Activity   Alcohol use: Yes    Comment: OCCASIONALLY   Drug use: No   Sexual activity: Yes    Partners: Male    Comment: No dietary restrictions, lives with Mom, Dad and brother and niece.  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 2-3 tabs po qd prn     aspirin EC 81 MG tablet Take 81 mg by mouth daily. Swallow whole.     cholecalciferol (VITAMIN D) 25 MCG (1000 UNIT) tablet Take 1,000 Units by mouth daily.     [employer] Omega-3 Krill Oil 500 MG CAPS Take by mouth.     cyclobenzaprine (FLEXERIL) 10 MG tablet Take 1 tablet (10 mg total) by mouth 3 (three) times daily as needed for muscle spasms. 90 tablet 2   fluticasone (FLONASE) 50 MCG/ACT nasal spray Place into both nostrils daily.     furosemide (LASIX) 20 MG tablet TAKE 1 TO 2 TABLETS BY MOUTH ONCE DAILY AS NEEDED FOR EDEMA 60 tablet 3   HYDROcodone-acetaminophen (NORCO/VICODIN) 5-325 MG tablet Take 1 tablet by mouth 3 (three) times daily as needed for moderate pain. 30 tablet 0   losartan (COZAAR) 25 MG tablet TAKE ONE TABLET BY MOUTH ONE TIME DAILY 90 tablet 0   metoprolol succinate (TOPROL-XL) 25 MG 24 hr tablet TAKE ONE TABLET BY MOUTH ONE TIME DAILY 90 tablet 1   Multiple Vitamins-Minerals (WOMENS MULTI VITAMIN & MINERAL PO) Take by mouth.     Norgestimate-Ethinyl Estradiol Triphasic (ORTHO TRI-CYCLEN, 28,) 0.18/0.215/0.25 MG-35 MCG tablet Take 1 tablet by mouth daily. 3 tablet 4   omeprazole (PRILOSEC) 40 MG capsule TAKE ONE CAPSULE BY MOUTH ONE TIME DAILY 90 capsule 0   Probiotic Product (PROBIOTIC-10 PO) Take by mouth.     No facility-administered medications prior to visit.    Allergies  Allergen Reactions   Skelaxin Hypertension    Review of Systems  Constitutional:  Negative for fever.  HENT:  Negative for congestion, sinus pain and sore throat.   Respiratory:  Negative for cough, shortness of breath and wheezing.   Cardiovascular:  Negative for palpitations.  Gastrointestinal:  Negative for blood in stool, constipation, diarrhea, nausea and vomiting.  Genitourinary:  Negative for  dysuria, frequency and hematuria.  Musculoskeletal:  Positive for back pain and joint pain (Knee). Negative for myalgias.  Skin:  Negative for rash.       (-) [place]  Neurological:  Negative for headaches.  Psychiatric/Behavioral:  Negative for depression. The patient is not nervous/anxious.        Objective:    Physical Exam Constitutional:      General: She is not in acute distress.    Appearance: Normal appearance. She is not ill-appearing.  HENT:     Head: Normocephalic and atraumatic.     Right Ear: Tympanic membrane, ear canal and external ear normal.     Left Ear: Tympanic membrane, ear canal and external ear normal.     Mouth/Throat:     Pharynx: No oropharyngeal exudate.  Eyes:     Extraocular Movements: Extraocular movements intact.     Pupils: Pupils are equal, round, and reactive to light.  Neck:     Thyroid: No thyromegaly.  Cardiovascular:     Rate and Rhythm: Normal rate and regular rhythm.     Heart sounds: Normal heart sounds. No murmur heard.    No gallop.  Pulmonary:     Effort: Pulmonary effort is normal. No respiratory distress.     Breath sounds: Normal breath sounds. No wheezing or rales.  Abdominal:     General: Bowel sounds are normal. There is no distension.     Palpations: Abdomen is soft.     Tenderness: There is no abdominal tenderness. There is no guarding.  Musculoskeletal:     Comments: 5/5 strength in both upper and lower extremities    Lymphadenopathy:     Cervical: No cervical adenopathy.  Skin:    General: Skin is warm and dry.  Neurological:     Mental Status: She is alert and oriented to person, place, and time.     Deep Tendon Reflexes:     Reflex Scores:      Patellar reflexes are 1+ on the right side and 1+ on the left side. Psychiatric:        Mood and Affect: Mood normal.        Behavior: Behavior normal.        Judgment: Judgment normal.     BP (!) 149/74 (BP Location: Right Arm, Patient Position: Sitting, Cuff  Size: Large)   Pulse 74   Temp 98.4 F (36.9 C) (Oral)   Resp 16   Ht 5\' 8"  (1.727 m)   Wt (!) 403 lb (182.8 kg)   LMP 06/05/2022 (Exact Date)   SpO2 97%   BMI 61.28 kg/m  Wt Readings from Last 3 Encounters:  07/12/22 (!) 403 lb (182.8 kg)  06/29/22 (!) 395 lb (179.2 kg)  04/04/22 (!) 407 lb 9.6 oz (184.9 kg)       Assessment & Plan:   Problem List Items Addressed This Visit       Unprioritized   Preventative health care    Mammo/pap up to date. Due for colo- order placed. Immunizations reviewed and up to date. Recommended flu shot and covid booster this fall.       Prediabetes   Relevant Orders   Hemoglobin A1c   Basic metabolic panel   Morbid obesity (HCC)    Wt Readings from Last 3 Encounters:  07/12/22 (!) 403 lb (182.8 kg)  06/29/22 (!) 395 lb (179.2 kg)  04/04/22 (!) 407 lb 9.6 oz (184.9 kg)  We spoke at length about focusing on fresh fruits, veggies and lean proteins. Discussed importance of preparing food at home and meal prepping.  Will also see if we can get [facility] approved for her to start. Discussed risks/common side effects.       Relevant Medications   Semaglutide-Weight Management (WEGOVY) 0.25 MG/0.5ML SOAJ   HTN (hypertension)    BP is elevated. She has not taken her AM medication today. Advised pt to take medication now and then we will recheck in 3-4 weeks at her follow up. She is to take her AM meds prior to coming to that visit.  BP Readings from Last 3 Encounters:  07/12/22 (!) 149/74  06/29/22 129/83  04/04/22 140/90         Other Visit Diagnoses     Colon cancer screening    -  Primary   Relevant Orders   Ambulatory referral to Gastroenterology        Meds ordered this encounter  Medications   Semaglutide-Weight Management (WEGOVY) 0.25 MG/0.5ML SOAJ    Sig: Inject 0.25 mg into the skin once a week.    Dispense:  2 mL    Refill:  0    Order Specific Question:   Supervising Provider    Answer:   04/06/22 A [[id number]]     I, Danise Edge, NP, personally preformed the services described in this documentation.  All medical record entries made by the scribe were at my direction and in my presence.  I have reviewed the chart and discharge instructions (if applicable) and agree that the  record reflects my personal performance and is accurate and complete. 07/12/2022   I,Amber Collins,acting as a scribe for Lemont FillersMelissa S O'Sullivan, NP.,have documented all relevant documentation on the behalf of Lemont FillersMelissa S O'Sullivan, NP,as directed by  Lemont FillersMelissa S O'Sullivan, NP while in the presence of Lemont FillersMelissa S O'Sullivan, NP.    Lemont FillersMelissa S O'Sullivan, NP

## 2022-07-12 NOTE — Assessment & Plan Note (Signed)
Mammo/pap up to date. Due for colo- order placed. Immunizations reviewed and up to date. Recommended flu shot and covid booster this fall.

## 2022-07-14 ENCOUNTER — Encounter: Payer: 59 | Admitting: Family Medicine

## 2022-07-15 DIAGNOSIS — G4733 Obstructive sleep apnea (adult) (pediatric): Secondary | ICD-10-CM | POA: Diagnosis not present

## 2022-07-15 NOTE — Telephone Encounter (Signed)
PA approved.   Effective from 07/12/2022 through 11/14/2022. Please note: This medication is to be titrated up in strength every 4 weeks as tolerated by the member. The quantity limit set of 4 pens per 180 days allows for this titration through all strengths using 4 pens of each strength every 28 days.

## 2022-07-16 ENCOUNTER — Other Ambulatory Visit: Payer: Self-pay | Admitting: Family

## 2022-07-18 ENCOUNTER — Encounter: Payer: Self-pay | Admitting: Obstetrics & Gynecology

## 2022-07-18 ENCOUNTER — Other Ambulatory Visit: Payer: Self-pay | Admitting: Family

## 2022-07-19 ENCOUNTER — Other Ambulatory Visit: Payer: Self-pay | Admitting: Obstetrics & Gynecology

## 2022-07-20 ENCOUNTER — Encounter (INDEPENDENT_AMBULATORY_CARE_PROVIDER_SITE_OTHER): Payer: Self-pay

## 2022-07-26 ENCOUNTER — Encounter: Payer: Self-pay | Admitting: Family Medicine

## 2022-07-26 ENCOUNTER — Encounter: Payer: Self-pay | Admitting: Obstetrics & Gynecology

## 2022-07-31 ENCOUNTER — Other Ambulatory Visit: Payer: Self-pay | Admitting: Family Medicine

## 2022-08-01 ENCOUNTER — Other Ambulatory Visit: Payer: Self-pay | Admitting: Family Medicine

## 2022-08-01 MED ORDER — HYDROCODONE-ACETAMINOPHEN 5-325 MG PO TABS: 1.0000 | ORAL_TABLET | Freq: Three times a day (TID) | ORAL | 0 refills | Status: DC | PRN

## 2022-08-01 NOTE — Telephone Encounter (Signed)
Requesting: hydrocodone 5-325mg   Contract: 01/03/22 UDS: 01/03/22 Last Visit: 07/12/22 w/ Efraim Kaufmann Next Visit: 08/16/22 w/ Efraim Kaufmann Last Refill: 05/25/22 #30 and 0RF  Please Advise

## 2022-08-16 ENCOUNTER — Ambulatory Visit: Payer: BC Managed Care – PPO | Admitting: Family

## 2022-08-19 ENCOUNTER — Telehealth: Payer: Self-pay | Admitting: General Practice

## 2022-08-19 NOTE — Telephone Encounter (Signed)
Left message on VM informing pt that her visit with Dr. Erin Fulling on 08/31/2022 at 3:10pm will change to virtual d/t provider will be on medical leave.  Asked patient to contact our office with any questions or concerns.

## 2022-08-28 ENCOUNTER — Other Ambulatory Visit: Payer: Self-pay | Admitting: Family Medicine

## 2022-08-28 DIAGNOSIS — R600 Localized edema: Secondary | ICD-10-CM

## 2022-08-31 ENCOUNTER — Telehealth (INDEPENDENT_AMBULATORY_CARE_PROVIDER_SITE_OTHER): Payer: BC Managed Care – PPO | Admitting: Obstetrics & Gynecology

## 2022-08-31 ENCOUNTER — Encounter: Payer: Self-pay | Admitting: Obstetrics & Gynecology

## 2022-08-31 DIAGNOSIS — N939 Abnormal uterine and vaginal bleeding, unspecified: Secondary | ICD-10-CM

## 2022-08-31 MED ORDER — MEGESTROL ACETATE 40 MG PO TABS: 40.0000 mg | ORAL_TABLET | Freq: Two times a day (BID) | ORAL | 8 refills | Status: DC

## 2022-08-31 NOTE — Progress Notes (Signed)
GYNECOLOGY VIRTUAL VISIT ENCOUNTER NOTE  Provider location: [facility]   Patient location: Home  I connected with Christina Mendez on 08/31/22 at  3:10 PM EDT by MyChart Video Encounter and verified that I am speaking with the correct person using two identifiers.   I discussed the limitations, risks, security and privacy concerns of performing an evaluation and management service virtually and the availability of in person appointments. I also discussed with the patient that there may be a patient responsible charge related to this service. The patient expressed understanding and agreed to proceed.   History:  Christina Mendez is a 46 y.o. G0P0000 female being evaluated today for AUB. Pt reports that her bleeding has been nonexistent as soon as she started the Megace. She denies any abnormal vaginal discharge, bleeding, or other concerns.  Pt does report that during the time of her cycle she has some dysmenorrhea that is brief.      Past Medical History:  Diagnosis Date   Acid reflux    Allergic state 03/02/2012   Allergy last couple of years   seasonal   Anxiety    Back pain    Cervical cancer screening 03/02/2012   Chest pain    Chicken pox 3rd grade   GERD (gastroesophageal reflux disease)    GERD (gastroesophageal reflux disease)    Heartburn    HTN (hypertension) 12/09/2011   Hyperlipidemia    Hypertension    Inflamed skin tag 03/02/2012   Menorrhagia 05/04/2015   OAB (overactive bladder)    Obese 12/09/2011   Obesity    OSA (obstructive sleep apnea)    Ovarian cyst 08/17/2012   Palpitations    Pedal edema 07/11/2017   Sinusitis 01/08/2012   Swollen ankles    Swollen feet    Vaginal Pap smear, abnormal    Past Surgical History:  Procedure Laterality Date   cyst removed  46 yr old   benign, abdominal   HERNIA REPAIR     The following portions of the patient's history were reviewed and updated as appropriate:  allergies, current medications, past family history, past medical history, past social history, past surgical history and problem list.   Health Maintenance:  Normal pap and negative HRHPV on 08/27/2020.  Normal mammogram on 12/15/2021.   Review of Systems:  Pertinent items noted in HPI and remainder of comprehensive ROS otherwise negative.  Physical Exam:   General:  Alert, oriented and cooperative. Patient appears to be in no acute distress.  Mental Status: Normal mood and affect. Normal behavior. Normal judgment and thought content.   Respiratory: Normal respiratory effort, no problems with respiration noted  Rest of physical exam deferred due to type of encounter  Labs and Imaging 09/14/2022 Narrative & Impression  CLINICAL DATA:  Abnormal uterine bleeding, irregular menses lasting for over 1 week, LMP 07/22/2020 through 08/22/2020   EXAM: TRANSABDOMINAL AND TRANSVAGINAL ULTRASOUND OF PELVIS   TECHNIQUE: Both transabdominal and transvaginal ultrasound examinations of the pelvis were performed. Transabdominal technique was performed for global imaging of the pelvis including uterus, ovaries, adnexal regions, and pelvic cul-de-sac. It was necessary to proceed with endovaginal exam following the transabdominal exam to visualize the uterus, endometrium, and to characterize a lesion in the ovary.   COMPARISON:  07/19/2004   FINDINGS: Uterus   Measurements: 8.9 x 5.1 x 4.2 cm = volume: 100 mL. Anteverted. Normal morphology without mass. Nabothian cysts in cervix.   Endometrium  Thickness: 7 mm.  No endometrial fluid or focal abnormality   Right ovary   Measurements: 3.5 x 3.9 x 3.0 cm = volume: 21 mL. Complex cystic mass RIGHT ovary 3.4 x 3.7 x 2.3 cm containing a thin septation which contains internal blood flow on color Doppler imaging. No mural nodularity or wall thickening. No additional masses.   Left ovary   Not visualized, likely obscured by bowel   Other  findings   No free pelvic fluid.  No adnexal masses.   IMPRESSION: Unremarkable uterus, endometrial complex and RIGHT ovary.   Nonvisualization of LEFT ovary.   Mildly complicated cystic mass RIGHT ovary 3.7 cm greatest size containing a thin septation which contains internal blood flow on color Doppler imaging; no other complicating features seen within this lesion.   This lesion is likely benign but recommend followup transvaginal ultrasound recommended in 12 weeks to assess stability.       06/29/2022 FINAL MICROSCOPIC DIAGNOSIS:   A. ENDOMETRIUM, BIOPSY:  -  Scattered fragments of weakly proliferative endometrium, negative for  hyperplasia/atypia, with stromal spindling (A MUM1 immunohistochemical  stain for plasma cells is pending and will be reported in an addendum)  in the background of abundant mucin with acute inflammatory cells.    Assessment and Plan:     Diagnoses and all orders for this visit:  Abnormal uterine bleeding (AUB) -     megestrol (MEGACE) 40 MG tablet; Take 1 tablet (40 mg total) by mouth 2 (two) times daily. Can increase to two tablets twice a day in the event of heavy bleeding   Pt has a f/u in Dec for an annual. Rec 6 month f/u. Will combine that visit with the AUB f/u.        I discussed the assessment and treatment plan with the patient. The patient was provided an opportunity to ask questions and all were answered. The patient agreed with the plan and demonstrated an understanding of the instructions.   The patient was advised to call back or seek an in-person evaluation/go to the ED if the symptoms worsen or if the condition fails to improve as anticipated.  I provided  20 minutes of face-to-face time during this encounter.   Lavonia Drafts, MD [facility] for [facility], [place]

## 2022-09-22 ENCOUNTER — Encounter: Payer: Self-pay | Admitting: Family Medicine

## 2022-09-22 ENCOUNTER — Other Ambulatory Visit: Payer: Self-pay | Admitting: Family Medicine

## 2022-09-22 ENCOUNTER — Other Ambulatory Visit (HOSPITAL_BASED_OUTPATIENT_CLINIC_OR_DEPARTMENT_OTHER): Payer: Self-pay

## 2022-09-22 MED ORDER — CYCLOBENZAPRINE HCL 10 MG PO TABS
10.0000 mg | ORAL_TABLET | Freq: Three times a day (TID) | ORAL | 2 refills | Status: DC | PRN
  Filled 2022-09-22: qty 90, 30d supply, fill #0

## 2022-09-22 MED ORDER — HYDROCODONE-ACETAMINOPHEN 5-325 MG PO TABS
1.0000 | ORAL_TABLET | Freq: Three times a day (TID) | ORAL | 0 refills | Status: DC | PRN
  Filled 2022-09-22: qty 21, 7d supply, fill #0
  Filled 2022-10-29: qty 9, 3d supply, fill #1

## 2022-09-22 MED ORDER — HYDROCODONE-ACETAMINOPHEN 5-325 MG PO TABS: 1.0000 | ORAL_TABLET | Freq: Three times a day (TID) | ORAL | 0 refills | Status: DC | PRN

## 2022-09-22 NOTE — Telephone Encounter (Signed)
Requesting: hydrocodone 5-325mg   Contract: 01/03/22 UDS: 01/03/22 Last Visit: 07/12/22 Next Visit: None Last Refill: 08/01/22 #30 and 0RF   Please Advise

## 2022-09-23 ENCOUNTER — Other Ambulatory Visit: Payer: Self-pay

## 2022-09-23 ENCOUNTER — Other Ambulatory Visit (HOSPITAL_BASED_OUTPATIENT_CLINIC_OR_DEPARTMENT_OTHER): Payer: Self-pay

## 2022-09-23 NOTE — Telephone Encounter (Signed)
Called pharmacy and Rx was cancel

## 2022-10-02 ENCOUNTER — Other Ambulatory Visit: Payer: Self-pay | Admitting: Family

## 2022-10-02 ENCOUNTER — Other Ambulatory Visit: Payer: Self-pay | Admitting: Family Medicine

## 2022-10-02 DIAGNOSIS — R600 Localized edema: Secondary | ICD-10-CM

## 2022-10-03 ENCOUNTER — Other Ambulatory Visit (HOSPITAL_BASED_OUTPATIENT_CLINIC_OR_DEPARTMENT_OTHER): Payer: Self-pay

## 2022-10-03 MED ORDER — OMEPRAZOLE 40 MG PO CPDR
40.0000 mg | DELAYED_RELEASE_CAPSULE | Freq: Every day | ORAL | 0 refills | Status: DC
  Filled 2022-10-03: qty 90, 90d supply, fill #0

## 2022-10-03 MED ORDER — FUROSEMIDE 20 MG PO TABS
ORAL_TABLET | ORAL | 2 refills | Status: DC
  Filled 2022-10-03: qty 60, 30d supply, fill #0
  Filled 2022-10-29: qty 60, 30d supply, fill #1
  Filled 2022-12-07: qty 60, 30d supply, fill #2

## 2022-10-03 NOTE — Telephone Encounter (Signed)
furosemide (LASIX) 20 MG tablet [[id number]]    Order Details Dose, Route, Frequency: As Directed  Dispense Quantity: 60 tablet Refills: 0        Sig: TAKE 1 OR 2 TABLETS BY MOUTH ONCE A DAY AS NEEDED FOR EDEMA       Start Date: 08/29/22 End Date: --  Written Date: 08/29/22 Expiration Date: 08/29/23     Associated Diagnoses: Pedal edema [R60.0]  Original Order:  furosemide (LASIX) 20 MG tablet [[id number]     Okay to refill?

## 2022-10-10 ENCOUNTER — Encounter: Payer: Self-pay | Admitting: Obstetrics & Gynecology

## 2022-10-11 ENCOUNTER — Other Ambulatory Visit: Payer: Self-pay | Admitting: Obstetrics & Gynecology

## 2022-10-11 ENCOUNTER — Other Ambulatory Visit (HOSPITAL_BASED_OUTPATIENT_CLINIC_OR_DEPARTMENT_OTHER): Payer: Self-pay

## 2022-10-11 DIAGNOSIS — N939 Abnormal uterine and vaginal bleeding, unspecified: Secondary | ICD-10-CM

## 2022-10-11 MED ORDER — MEGESTROL ACETATE 40 MG PO TABS
40.0000 mg | ORAL_TABLET | Freq: Two times a day (BID) | ORAL | 8 refills | Status: DC
  Filled 2022-10-11: qty 60, 15d supply, fill #0
  Filled 2022-10-29: qty 60, 15d supply, fill #1
  Filled 2022-12-07: qty 60, 15d supply, fill #2
  Filled 2023-01-14: qty 60, 15d supply, fill #3
  Filled 2023-01-24 – 2023-01-26 (×4): qty 60, 15d supply, fill #4
  Filled 2023-02-10: qty 60, 15d supply, fill #5

## 2022-10-29 ENCOUNTER — Other Ambulatory Visit: Payer: Self-pay | Admitting: Family Medicine

## 2022-10-31 ENCOUNTER — Other Ambulatory Visit: Payer: Self-pay | Admitting: Family Medicine

## 2022-10-31 ENCOUNTER — Other Ambulatory Visit (HOSPITAL_BASED_OUTPATIENT_CLINIC_OR_DEPARTMENT_OTHER): Payer: Self-pay

## 2022-10-31 MED ORDER — LOSARTAN POTASSIUM 25 MG PO TABS
25.0000 mg | ORAL_TABLET | Freq: Every day | ORAL | 1 refills | Status: DC
  Filled 2022-10-31: qty 90, 90d supply, fill #0
  Filled 2023-01-24: qty 90, 90d supply, fill #1

## 2022-10-31 MED ORDER — HYDROCODONE-ACETAMINOPHEN 5-325 MG PO TABS
1.0000 | ORAL_TABLET | Freq: Three times a day (TID) | ORAL | 0 refills | Status: DC | PRN
  Filled 2022-10-31: qty 30, 10d supply, fill #0

## 2022-10-31 NOTE — Telephone Encounter (Signed)
Requesting: hydrocodone 5-325mg   Contract:01/03/22 UDS: 01/03/22 Last Visit: 07/12/22 w/ Efraim Kaufmann Next Visit: None Last Refill: 09/22/22 #30 and 0RF   Please Advise

## 2022-11-07 DIAGNOSIS — G4733 Obstructive sleep apnea (adult) (pediatric): Secondary | ICD-10-CM | POA: Diagnosis not present

## 2022-11-13 ENCOUNTER — Other Ambulatory Visit: Payer: Self-pay | Admitting: Family Medicine

## 2022-11-14 ENCOUNTER — Other Ambulatory Visit (HOSPITAL_BASED_OUTPATIENT_CLINIC_OR_DEPARTMENT_OTHER): Payer: Self-pay

## 2022-11-14 MED ORDER — METOPROLOL SUCCINATE ER 25 MG PO TB24
25.0000 mg | ORAL_TABLET | Freq: Every day | ORAL | 1 refills | Status: DC
  Filled 2022-11-14: qty 90, 90d supply, fill #0
  Filled 2023-02-10: qty 90, 90d supply, fill #1

## 2022-12-09 ENCOUNTER — Other Ambulatory Visit: Payer: Self-pay | Admitting: Family Medicine

## 2022-12-13 ENCOUNTER — Other Ambulatory Visit (HOSPITAL_BASED_OUTPATIENT_CLINIC_OR_DEPARTMENT_OTHER): Payer: Self-pay

## 2022-12-13 MED ORDER — HYDROCODONE-ACETAMINOPHEN 5-325 MG PO TABS
1.0000 | ORAL_TABLET | Freq: Three times a day (TID) | ORAL | 0 refills | Status: DC | PRN
  Filled 2022-12-13: qty 30, 10d supply, fill #0

## 2022-12-16 ENCOUNTER — Telehealth: Payer: Self-pay | Admitting: General Practice

## 2022-12-16 NOTE — Telephone Encounter (Signed)
Left message for patient to contact our office to schedule follow up & Annual for March 2024 with Dr. Eugenie Norrie.

## 2022-12-16 NOTE — Telephone Encounter (Signed)
-----   Message from [name], [place] sent at 08/31/2022  3:52 PM EDT ----- Regarding: Annual Christina Drafts, MD  [name], NT Please push Dec visit out 6 months. I'll do hr f/u and annual at the same time.   Clh-S      March 2024

## 2022-12-30 ENCOUNTER — Other Ambulatory Visit: Payer: Self-pay | Admitting: Family Medicine

## 2022-12-30 DIAGNOSIS — R600 Localized edema: Secondary | ICD-10-CM

## 2023-01-02 ENCOUNTER — Other Ambulatory Visit (HOSPITAL_BASED_OUTPATIENT_CLINIC_OR_DEPARTMENT_OTHER): Payer: Self-pay

## 2023-01-02 MED ORDER — OMEPRAZOLE 40 MG PO CPDR
40.0000 mg | DELAYED_RELEASE_CAPSULE | Freq: Every day | ORAL | 0 refills | Status: DC
  Filled 2023-01-02: qty 90, 90d supply, fill #0

## 2023-01-02 MED ORDER — FUROSEMIDE 20 MG PO TABS
20.0000 mg | ORAL_TABLET | Freq: Every day | ORAL | 0 refills | Status: DC | PRN
  Filled 2023-01-02: qty 60, 30d supply, fill #0

## 2023-01-24 ENCOUNTER — Other Ambulatory Visit: Payer: Self-pay

## 2023-01-24 ENCOUNTER — Other Ambulatory Visit: Payer: Self-pay | Admitting: Family Medicine

## 2023-01-24 ENCOUNTER — Other Ambulatory Visit (HOSPITAL_BASED_OUTPATIENT_CLINIC_OR_DEPARTMENT_OTHER): Payer: Self-pay

## 2023-01-24 MED ORDER — HYDROCODONE-ACETAMINOPHEN 5-325 MG PO TABS: 1.0000 | ORAL_TABLET | Freq: Three times a day (TID) | ORAL | 0 refills | Status: DC | PRN

## 2023-01-24 NOTE — Telephone Encounter (Signed)
Requesting: hydrocodone 5-377m  Contract:01/03/22 UDS: 01/03/22 Last Visit: 07/12/22 w/ MLenna SciaraNext Visit: None Last Refill: 12/13/22 #30 and 0RF   Please Advise

## 2023-01-25 ENCOUNTER — Other Ambulatory Visit: Payer: Self-pay

## 2023-01-26 ENCOUNTER — Encounter: Payer: Self-pay | Admitting: Internal Medicine

## 2023-01-26 ENCOUNTER — Other Ambulatory Visit (HOSPITAL_BASED_OUTPATIENT_CLINIC_OR_DEPARTMENT_OTHER): Payer: Self-pay

## 2023-02-04 ENCOUNTER — Other Ambulatory Visit: Payer: Self-pay | Admitting: Family Medicine

## 2023-02-04 DIAGNOSIS — R600 Localized edema: Secondary | ICD-10-CM

## 2023-02-06 ENCOUNTER — Other Ambulatory Visit (HOSPITAL_BASED_OUTPATIENT_CLINIC_OR_DEPARTMENT_OTHER): Payer: Self-pay

## 2023-02-06 MED ORDER — FUROSEMIDE 20 MG PO TABS
20.0000 mg | ORAL_TABLET | Freq: Every day | ORAL | 0 refills | Status: DC | PRN
  Filled 2023-02-06: qty 60, 30d supply, fill #0

## 2023-02-07 ENCOUNTER — Telehealth: Payer: Self-pay | Admitting: *Deleted

## 2023-02-07 NOTE — Telephone Encounter (Signed)
Pt.scheduled for 04/27/23 with DR. PERRY '@940'$  AM,Pre-visit,procedure canceled.

## 2023-02-07 NOTE — Telephone Encounter (Signed)
Called pt. To verify height and weight,height 5 7 estimated weight is 375 lbs,which put BMI AT 58.7,explained to pt. That procedure would have to be done at the hospital but would need to inform dr. And she would receive call back with recommendations,verbalized understanding.  Sir please advise if you would like a direct to hospital or OV?

## 2023-02-07 NOTE — Telephone Encounter (Signed)
Former Dr. Ardis Hughs patient.  I have never seen this patient. Recommend routine office follow-up to discuss alternatives forms of screening and her her increased risk of sedation

## 2023-02-10 ENCOUNTER — Other Ambulatory Visit: Payer: Self-pay

## 2023-02-10 ENCOUNTER — Other Ambulatory Visit (HOSPITAL_BASED_OUTPATIENT_CLINIC_OR_DEPARTMENT_OTHER): Payer: Self-pay

## 2023-02-15 ENCOUNTER — Telehealth: Payer: Self-pay | Admitting: General Practice

## 2023-02-15 ENCOUNTER — Encounter: Payer: Self-pay | Admitting: Obstetrics & Gynecology

## 2023-02-15 ENCOUNTER — Ambulatory Visit (INDEPENDENT_AMBULATORY_CARE_PROVIDER_SITE_OTHER): Payer: BC Managed Care – PPO | Admitting: Obstetrics & Gynecology

## 2023-02-15 VITALS — BP 160/86 | HR 88 | Ht 67.01 in | Wt 398.0 lb

## 2023-02-15 DIAGNOSIS — N949 Unspecified condition associated with female genital organs and menstrual cycle: Secondary | ICD-10-CM

## 2023-02-15 DIAGNOSIS — N939 Abnormal uterine and vaginal bleeding, unspecified: Secondary | ICD-10-CM | POA: Diagnosis not present

## 2023-02-15 MED ORDER — MEGESTROL ACETATE 40 MG PO TABS: 40.0000 mg | ORAL_TABLET | Freq: Two times a day (BID) | ORAL | 8 refills | Status: DC

## 2023-02-15 NOTE — Progress Notes (Signed)
History:  47 y.o. G0P0000 here today for f/u of AUB. Pt reports that since she's taken the Megace, she has no further bleeding. She reports that she has some occ fleeting cramping in the lower pelvic but, this does not require medication. She says that she ONLY has this pain at night. It does not wake her from sleep. She reports that she may notice it if she has to wake up and use the toilet otherwise she doesn't notice it. She denies any other s/sx related to prev bleeding or med.    The following portions of the patient's history were reviewed and updated as appropriate: allergies, current medications, past family history, past medical history, past social history, past surgical history and problem list.  Review of Systems:  Pertinent items are noted in HPI.    Objective:  Physical Exam Blood pressure (!) 160/86, pulse 88, height 5' 7.01" (1.702 m), weight (!) 398 lb (180.5 kg).  CONSTITUTIONAL: Well-developed, well-nourished female in no acute distress.  HENT:  Normocephalic, atraumatic EYES: Conjunctivae and EOM are normal. No scleral icterus.  NECK: Normal range of motion SKIN: Skin is warm and dry. No rash noted. Not diaphoretic.No pallor. [place]: Alert and oriented to person, place, and time. Normal coordination.  Pelvic exam: not done    Labs and Imaging 06/29/2022 FINAL MICROSCOPIC DIAGNOSIS:   A. ENDOMETRIUM, BIOPSY:  -  Scattered fragments of weakly proliferative endometrium, negative for  hyperplasia/atypia, with stromal spindling (A MUM1 immunohistochemical  stain for plasma cells is pending and will be reported in an addendum)  in the background of abundant mucin with acute inflammatory cells.   09/14/2020 CLINICAL DATA:  Abnormal uterine bleeding, irregular menses lasting for over 1 week, LMP 07/22/2020 through 08/22/2020   EXAM: TRANSABDOMINAL AND TRANSVAGINAL ULTRASOUND OF PELVIS   TECHNIQUE: Both transabdominal and transvaginal ultrasound examinations of  the pelvis were performed. Transabdominal technique was performed for global imaging of the pelvis including uterus, ovaries, adnexal regions, and pelvic cul-de-sac. It was necessary to proceed with endovaginal exam following the transabdominal exam to visualize the uterus, endometrium, and to characterize a lesion in the ovary.   COMPARISON:  07/19/2004   FINDINGS: Uterus   Measurements: 8.9 x 5.1 x 4.2 cm = volume: 100 mL. Anteverted. Normal morphology without mass. Nabothian cysts in cervix.   Endometrium   Thickness: 7 mm.  No endometrial fluid or focal abnormality   Right ovary   Measurements: 3.5 x 3.9 x 3.0 cm = volume: 21 mL. Complex cystic mass RIGHT ovary 3.4 x 3.7 x 2.3 cm containing a thin septation which contains internal blood flow on color Doppler imaging. No mural nodularity or wall thickening. No additional masses.   Left ovary   Not visualized, likely obscured by bowel   Other findings   No free pelvic fluid.  No adnexal masses.   IMPRESSION: Unremarkable uterus, endometrial complex and RIGHT ovary.   Nonvisualization of LEFT ovary.   Mildly complicated cystic mass RIGHT ovary 3.7 cm greatest size containing a thin septation which contains internal blood flow on color Doppler imaging; no other complicating features seen within this lesion.   This lesion is likely benign but recommend followup transvaginal ultrasound recommended in 12 weeks to assess stabili  Assessment & Plan:  Pt with AUB. Sx resolved on Megace.  I noticed post visit that pts last [place] was 2021 and a cyst was noted on her right ovary.   Rec f/u US   Cont Megace.  F/u in 6  months for annual or sooner prn  Refillted Megace '40mg'$  daily 2 po q day    Mishka Stegemann L. Harraway-Smith, M.D., Cherlynn June

## 2023-02-15 NOTE — Progress Notes (Signed)
Follow up abnormal uterine bleeding. Patient has not had any bleeding since taking the megace.  Kathrene Alu RN

## 2023-02-15 NOTE — Telephone Encounter (Signed)
Dr. Ihor Dow recommends patient to have a f/u US.  Left message on VM asking patient to contact our office to schedule.

## 2023-02-17 ENCOUNTER — Telehealth: Payer: Self-pay | Admitting: General Practice

## 2023-02-17 NOTE — Telephone Encounter (Signed)
-----   Message from Lavonia Drafts, MD sent at 02/15/2023  2:59 PM EST ----- Please call pt. I saw her today but, neglected to order a f/u US. Please have her get this.   Thx  Clh-S

## 2023-02-17 NOTE — Telephone Encounter (Signed)
Dr. Ihor Dow recommends patient to have a f/u US.  Left message on VM asking patient to contact our office to schedule.  Mychart message sent and was read by patient.  She has not returned our call to schedule [place].

## 2023-03-03 ENCOUNTER — Telehealth (HOSPITAL_BASED_OUTPATIENT_CLINIC_OR_DEPARTMENT_OTHER): Payer: Self-pay

## 2023-03-08 ENCOUNTER — Telehealth: Payer: Self-pay | Admitting: General Practice

## 2023-03-08 NOTE — Telephone Encounter (Signed)
Patient called and Mychart message sent to patient to contact our office to schedule [place].  Pt read message and has not returned call.  Receptionist with Radiology has tried to reach patient to schedule and was not successful.

## 2023-03-13 NOTE — Progress Notes (Unsigned)
Established Office Visit  Patient: Christina Mendez   DOB: Jul 06, 1976   47 y.o. Female  MRN: [id number]  Subjective:    Chief Complaint  Patient presents with   Annual Exam - appears that CPE was charged August 2023. Same insurance per patient - will make this visit a routine follow-up instead.     Christina Mendez is a 47 y.o. female who presents today for follow-up. She reports consuming a general diet. The patient does not participate in regular exercise at present. She generally feels fairly well. She reports sleeping fairly well. She does not have additional problems to discuss today.    Hypertension:  She is currently taking losartan 25 mg daily, metoprolol succinate 25 mg daily, and PRN lasix 20-40 mg daily for edema (taking daily usually). By the end of the day her ankles have swollen again. Patient denies any chest pain, palpitations, dyspnea, wheezing, recurrent headaches, vision changes.  Obesity:  She was prescribed Wegovy 0.25 mg/week - did not take due to cost. Working on lifestyle measures - . She also tried the [facility], but could not afford the frequent visits.   Vitamin D deficiency:  She is currently taking vitamin D 1,000 units daily.  Prediabetes:  Lab Results  Component Value Date   HGBA1C 6.0 07/12/2022    She is waiting to hear back about scheduling her colonoscopy.   Anxiety/Depression: Reports she has been feeling at baseline, some improvement since starting new job last year. Feels like getting older and having to move back in with her parents has been an adjustment. She denies SI/HI. Reports she has a good support system and a strong [place] faith. She declines further treatment at this time.    Most recent fall risk assessment:    03/14/2023    9:07 AM  [place] in the past year? 0  Number falls in past yr: 0  Injury with Fall? 0  Risk for fall due to : No Fall Risks  Follow up Falls evaluation completed     Most recent  depression screenings:    03/14/2023    9:29 AM 07/12/2022    8:08 AM  PHQ 2/9 Scores  PHQ - 2 Score 2 1  PHQ- 9 Score 14       03/14/2023    9:29 AM  GAD 7 : Generalized Anxiety Score  Nervous, Anxious, on Edge 1  Control/stop worrying 1  Worry too much - different things 1  Trouble relaxing 1  Restless 0  Easily annoyed or irritable 1  Afraid - awful might happen 1  Total GAD 7 Score 6  Anxiety Difficulty Somewhat difficult        Patient Active Problem List   Diagnosis Date Noted   Right knee pain 01/03/2022   Vitamin D deficiency 08/31/2020   Prediabetes 06/11/2020   Back pain 04/28/2020   OAB (overactive bladder) 04/28/2020   Skin lesion of scalp 01/11/2018   Pedal edema 07/11/2017   Esophageal reflux 07/28/2015   Generalized abdominal pain 07/28/2015   Menorrhagia 05/04/2015   Allergic rhinitis 09/05/2014   Hyperlipidemia, mixed 08/24/2014   Absence of bladder continence 08/24/2014   Hyponatremia 08/24/2014   Sleep apnea 07/03/2014   Insomnia 04/02/2014   Amenorrhea 02/23/2014   Ovarian cyst 08/17/2012   Cervical cancer screening 03/02/2012   Allergic state 03/02/2012   Sinusitis 01/08/2012   Preventative health care 12/11/2011   HTN (hypertension) 12/09/2011   Morbid obesity 12/09/2011  Family History  Problem Relation Age of Onset   Fibromyalgia Mother    Diabetes Mother        type 2   Hypertension Mother    Thyroid disease Mother    Cancer Father 29       prostate cancer   Diabetes Father        type 2   Heart disease Father 67       2 MI   Hypertension Father    Heart attack Father 51       X 2   Stroke Father        mini strokes   GER disease Father    Hyperlipidemia Father    Cholelithiasis Father    Venous thrombosis Father        in liver   Colon polyps Father    Sleep apnea Father    Obesity Father    Hypertension Brother    Venous thrombosis Brother        on spine   Cancer Maternal Grandfather        stomach    Hypertension Paternal Grandmother    Diabetes Paternal Grandmother        type 2   Heart attack Paternal Grandmother    Heart disease Paternal Grandmother    Heart attack Paternal Grandfather    Heart disease Paternal Grandfather    Hypertension Paternal Grandfather    Cancer Maternal Uncle        prostate cancer   Allergies  Allergen Reactions   Skelaxin Hypertension      Patient Care Team: Mosie Lukes, MD as PCP - General (Family Medicine)   Outpatient Medications Prior to Visit  Medication Sig   acetaminophen (TYLENOL) 500 MG tablet Take 500 mg by mouth every 6 (six) hours as needed. 2-3 tabs po qd prn   aspirin EC 81 MG tablet Take 81 mg by mouth daily. Swallow whole.   cholecalciferol (VITAMIN D) 25 MCG (1000 UNIT) tablet Take 1,000 Units by mouth daily.   [employer] Omega-3 Krill Oil 500 MG CAPS Take by mouth.   cyclobenzaprine (FLEXERIL) 10 MG tablet Take 1 tablet (10 mg total) by mouth 3 (three) times daily as needed for muscle spasms.   fluticasone (FLONASE) 50 MCG/ACT nasal spray Place into both nostrils daily.   furosemide (LASIX) 20 MG tablet Take 1-2 tablets (20-40 mg total) by mouth daily as needed for edema. *Need office visit*   losartan (COZAAR) 25 MG tablet Take 1 tablet (25 mg total) by mouth daily.   megestrol (MEGACE) 40 MG tablet Take 1 tablet (40 mg total) by mouth 2 (two) times daily. Can increase to two tablets twice a day in the event of heavy bleeding   metoprolol succinate (TOPROL-XL) 25 MG 24 hr tablet Take 1 tablet (25 mg total) by mouth daily.   Multiple Vitamins-Minerals (WOMENS MULTI VITAMIN & MINERAL PO) Take by mouth.   omeprazole (PRILOSEC) 40 MG capsule Take 1 capsule (40 mg total) by mouth daily.   Probiotic Product (PROBIOTIC-10 PO) Take by mouth.   [DISCONTINUED] HYDROcodone-acetaminophen (NORCO/VICODIN) 5-325 MG tablet Take 1 tablet by mouth 3 (three) times daily as needed for moderate pain.   [DISCONTINUED] Semaglutide-Weight Management  (WEGOVY) 0.25 MG/0.5ML SOAJ Inject 0.25 mg into the skin once a week.   No facility-administered medications prior to visit.    ROS All review of systems negative except what is listed in the HPI        Objective:  BP (!) 165/88   Pulse 72   Ht 5\' 7"  (1.702 m)   Wt (!) 393 lb (178.3 kg)   SpO2 98%   BMI 61.55 kg/m    Physical Exam Vitals reviewed.  Constitutional:      Appearance: Normal appearance.  HENT:     Head: Normocephalic and atraumatic.  Cardiovascular:     Rate and Rhythm: Normal rate and regular rhythm.     Pulses: Normal pulses.     Heart sounds: Normal heart sounds. No murmur heard. Pulmonary:     Effort: Pulmonary effort is normal.     Breath sounds: Normal breath sounds. No wheezing, rhonchi or rales.  Musculoskeletal:     Comments: BLE nonpitting edema, baseline per patient  Skin:    General: Skin is warm and dry.     Findings: No bruising, erythema or lesion.  Neurological:     Mental Status: She is alert and oriented to person, place, and time.  Psychiatric:        Mood and Affect: Mood normal.        Behavior: Behavior normal.        Thought Content: Thought content normal.        Judgment: Judgment normal.      No results found for any visits on 03/14/23.     Assessment & Plan:    Routine Health Maintenance and Physical Exam  Immunization History  Administered Date(s) Administered   DTaP 03/03/1975, 04/02/1975, 05/02/1975, 01/01/1979, 02/13/1981   Hepatitis B, ADULT 11/30/2015, 01/01/2016   IPV 03/03/1975, 04/02/1975, 05/02/1975, 01/01/1979, 02/13/1981   Influenza Split 12/09/2011, 08/17/2012   Influenza, Seasonal, Injecte, Preservative Fre 09/05/2015   Influenza,inj,Quad PF,6+ Mos 08/21/2014, 08/13/2019, 08/31/2020   Influenza-Unspecified 09/11/2013, 09/08/2021   MMR 03/26/1976, 12/23/2010   PFIZER(Purple Top)SARS-COV-2 Vaccination 02/21/2020, 03/18/2020, 12/19/2020   PPD Test 12/21/2011, 03/31/2014, 11/27/2015   Pfizer  Covid-19 Vaccine Bivalent Booster 67yrs & up 09/08/2021   Td 02/20/2003, 03/31/2014   Tdap 02/20/2003    Health Maintenance  Topic Date Due   COLONOSCOPY (Pts 45-45yrs Insurance coverage will need to be confirmed)  Never done   COVID-19 Vaccine (5 - 2023-24 season) 03/13/2024 (Originally 08/12/2022)   INFLUENZA VACCINE  07/13/2023   PAP SMEAR-Modifier  08/28/2023   DTaP/Tdap/Td (8 - Td or Tdap) 03/31/2024   HPV VACCINES  Aged Out   Hepatitis C Screening  Discontinued   HIV Screening  Discontinued    Discussed health benefits of physical activity, and encouraged her to engage in regular exercise appropriate for her age and condition.  Problem List Items Addressed This Visit     HTN (hypertension) - Primary    Blood pressure is not at goal for age and co-morbidities.   Recommendations: continue current meds, monitor at home, nurse visit for recheck in 2 weeks - BP goal <130/80 - monitor and log blood pressures at home - check around the same time each day in a relaxed setting - Limit salt to <2000 mg/day - Follow DASH eating plan (heart healthy diet) - limit alcohol to 2 standard drinks per day for men and 1 per day for women - avoid tobacco products - get at least 2 hours of regular aerobic exercise weekly Patient aware of signs/symptoms requiring further/urgent evaluation. Labs updated today.       Relevant Orders   TSH   Lipid Panel w/reflex Direct LDL   COMPLETE METABOLIC PANEL WITH GFR   CBC with Differential/Platelet   Hyperlipidemia, mixed  Labs today. Continue Omega-3 Lifestyle factors for lowering cholesterol include: Diet therapy - heart-healthy diet rich in fruits, veggies, fiber-rich whole grains, lean meats, chicken, fish (at least twice a week), fat-free or 1% dairy products; foods low in saturated/trans fats, cholesterol, sodium, and sugar. Mediterranean diet has shown to be very heart healthy. Regular exercise - recommend at least 30 minutes a day, 5 times  per week Weight management        Relevant Orders   Lipid Panel w/reflex Direct LDL   Back pain    Encouraged supportive measures - ice, heat, massage, NSAIDs, home exercises/stretches Refilled Norco (use sparingly). PDMP reviewed.       Relevant Medications   HYDROcodone-acetaminophen (NORCO/VICODIN) 5-325 MG tablet   Prediabetes    Labs today Discussed heart healthy, low-carb diet and regular physical activity, 150 minutes/week      Relevant Orders   COMPLETE METABOLIC PANEL WITH GFR   Hemoglobin A1c   Vitamin D deficiency    Continue supplementation Labs today      Relevant Orders   Vitamin D (25 hydroxy)   Other Visit Diagnoses              Bilateral leg edema     - labs today - continue the lasix as needed - start wearing compression socks - elevate your legs when you are seated, as much as possible - try to minimize the sodium in your diet    Relevant Orders   COMPLETE METABOLIC PANEL WITH GFR   B Nat Peptide      Return in about 2 weeks (around 03/28/2023) for BP check with nurse; routine f/u with PCP in 6 months .     Terrilyn Saver, NP

## 2023-03-14 ENCOUNTER — Other Ambulatory Visit (HOSPITAL_BASED_OUTPATIENT_CLINIC_OR_DEPARTMENT_OTHER): Payer: Self-pay

## 2023-03-14 ENCOUNTER — Ambulatory Visit (INDEPENDENT_AMBULATORY_CARE_PROVIDER_SITE_OTHER): Payer: BC Managed Care – PPO | Admitting: Family Medicine

## 2023-03-14 ENCOUNTER — Encounter: Payer: Self-pay | Admitting: Family Medicine

## 2023-03-14 VITALS — BP 165/88 | HR 72 | Ht 67.0 in | Wt 393.0 lb

## 2023-03-14 DIAGNOSIS — R7303 Prediabetes: Secondary | ICD-10-CM

## 2023-03-14 DIAGNOSIS — R600 Localized edema: Secondary | ICD-10-CM | POA: Diagnosis not present

## 2023-03-14 DIAGNOSIS — I10 Essential (primary) hypertension: Secondary | ICD-10-CM | POA: Diagnosis not present

## 2023-03-14 DIAGNOSIS — Z1329 Encounter for screening for other suspected endocrine disorder: Secondary | ICD-10-CM

## 2023-03-14 DIAGNOSIS — E559 Vitamin D deficiency, unspecified: Secondary | ICD-10-CM | POA: Diagnosis not present

## 2023-03-14 DIAGNOSIS — E782 Mixed hyperlipidemia: Secondary | ICD-10-CM | POA: Diagnosis not present

## 2023-03-14 DIAGNOSIS — G8929 Other chronic pain: Secondary | ICD-10-CM

## 2023-03-14 DIAGNOSIS — M545 Low back pain, unspecified: Secondary | ICD-10-CM

## 2023-03-14 DIAGNOSIS — Z0000 Encounter for general adult medical examination without abnormal findings: Secondary | ICD-10-CM

## 2023-03-14 LAB — CBC WITH DIFFERENTIAL/PLATELET
Basophils Absolute: 0 10*3/uL (ref 0.0–0.1)
Basophils Relative: 0.6 % (ref 0.0–3.0)
Eosinophils Absolute: 0.1 10*3/uL (ref 0.0–0.7)
Eosinophils Relative: 1.4 % (ref 0.0–5.0)
HCT: 41.5 % (ref 36.0–46.0)
Hemoglobin: 14.1 g/dL (ref 12.0–15.0)
Lymphocytes Relative: 28.2 % (ref 12.0–46.0)
Lymphs Abs: 1.5 10*3/uL (ref 0.7–4.0)
MCHC: 34 g/dL (ref 30.0–36.0)
MCV: 91.1 fl (ref 78.0–100.0)
Monocytes Absolute: 0.3 10*3/uL (ref 0.1–1.0)
Monocytes Relative: 6.7 % (ref 3.0–12.0)
Neutro Abs: 3.3 10*3/uL (ref 1.4–7.7)
Neutrophils Relative %: 63.1 % (ref 43.0–77.0)
Platelets: 282 10*3/uL (ref 150.0–400.0)
RBC: 4.55 Mil/uL (ref 3.87–5.11)
RDW: 14 % (ref 11.5–15.5)
WBC: 5.2 10*3/uL (ref 4.0–10.5)

## 2023-03-14 LAB — BRAIN NATRIURETIC PEPTIDE: Pro B Natriuretic peptide (BNP): 17 pg/mL (ref 0.0–100.0)

## 2023-03-14 LAB — HEMOGLOBIN A1C: Hgb A1c MFr Bld: 6.2 % (ref 4.6–6.5)

## 2023-03-14 LAB — TSH: TSH: 0.49 u[IU]/mL (ref 0.35–5.50)

## 2023-03-14 LAB — VITAMIN D 25 HYDROXY (VIT D DEFICIENCY, FRACTURES): VITD: 50.34 ng/mL (ref 30.00–100.00)

## 2023-03-14 MED ORDER — HYDROCODONE-ACETAMINOPHEN 5-325 MG PO TABS
1.0000 | ORAL_TABLET | Freq: Three times a day (TID) | ORAL | 0 refills | Status: DC | PRN
Start: 2023-03-14 — End: 2023-04-18
  Filled 2023-03-14: qty 30, 10d supply, fill #0

## 2023-03-14 NOTE — Assessment & Plan Note (Signed)
Encouraged supportive measures - ice, heat, massage, NSAIDs, home exercises/stretches Refilled Norco (use sparingly). PDMP reviewed.

## 2023-03-14 NOTE — Assessment & Plan Note (Signed)
Labs today. Continue Omega-3 Lifestyle factors for lowering cholesterol include: Diet therapy - heart-healthy diet rich in fruits, veggies, fiber-rich whole grains, lean meats, chicken, fish (at least twice a week), fat-free or 1% dairy products; foods low in saturated/trans fats, cholesterol, sodium, and sugar. Mediterranean diet has shown to be very heart healthy. Regular exercise - recommend at least 30 minutes a day, 5 times per week Weight management

## 2023-03-14 NOTE — Patient Instructions (Signed)
For your swelling: - labs today - continue the lasix as needed - start wearing compression socks - elevate your legs when you are seated, as much as possible - try to minimize the sodium in your diet

## 2023-03-14 NOTE — Assessment & Plan Note (Signed)
Labs today Discussed heart healthy, low-carb diet and regular physical activity, 150 minutes/week

## 2023-03-14 NOTE — Assessment & Plan Note (Signed)
Continue supplementation. Labs today  

## 2023-03-14 NOTE — Assessment & Plan Note (Signed)
Blood pressure is not at goal for age and co-morbidities.   Recommendations: continue current meds, monitor at home, nurse visit for recheck in 2 weeks - BP goal <130/80 - monitor and log blood pressures at home - check around the same time each day in a relaxed setting - Limit salt to <2000 mg/day - Follow DASH eating plan (heart healthy diet) - limit alcohol to 2 standard drinks per day for men and 1 per day for women - avoid tobacco products - get at least 2 hours of regular aerobic exercise weekly Patient aware of signs/symptoms requiring further/urgent evaluation. Labs updated today.

## 2023-03-15 LAB — COMPLETE METABOLIC PANEL WITH GFR
AG Ratio: 1.5 (calc) (ref 1.0–2.5)
ALT: 28 U/L (ref 6–29)
AST: 19 U/L (ref 10–35)
Albumin: 4.2 g/dL (ref 3.6–5.1)
Alkaline phosphatase (APISO): 62 U/L (ref 31–125)
BUN: 10 mg/dL (ref 7–25)
CO2: 24 mmol/L (ref 20–32)
Calcium: 9.4 mg/dL (ref 8.6–10.2)
Chloride: 107 mmol/L (ref 98–110)
Creat: 0.88 mg/dL (ref 0.50–0.99)
Globulin: 2.8 g/dL (calc) (ref 1.9–3.7)
Glucose, Bld: 106 mg/dL — ABNORMAL HIGH (ref 65–99)
Potassium: 4.2 mmol/L (ref 3.5–5.3)
Sodium: 142 mmol/L (ref 135–146)
Total Bilirubin: 0.7 mg/dL (ref 0.2–1.2)
Total Protein: 7 g/dL (ref 6.1–8.1)
eGFR: 81 mL/min/{1.73_m2} (ref >=60)

## 2023-03-15 LAB — LIPID PANEL W/REFLEX DIRECT LDL
Cholesterol: 156 mg/dL (ref <200)
HDL: 52 mg/dL (ref >=50)
LDL Cholesterol (Calc): 86 mg/dL (calc)
Non-HDL Cholesterol (Calc): 104 mg/dL (calc) (ref <130)
Total CHOL/HDL Ratio: 3 (calc) (ref <5.0)
Triglycerides: 90 mg/dL (ref <150)

## 2023-03-17 ENCOUNTER — Encounter: Payer: BC Managed Care – PPO | Admitting: Internal Medicine

## 2023-03-27 ENCOUNTER — Telehealth (HOSPITAL_BASED_OUTPATIENT_CLINIC_OR_DEPARTMENT_OTHER): Payer: Self-pay

## 2023-03-28 ENCOUNTER — Ambulatory Visit (HOSPITAL_BASED_OUTPATIENT_CLINIC_OR_DEPARTMENT_OTHER): Payer: BC Managed Care – PPO

## 2023-03-28 ENCOUNTER — Ambulatory Visit (INDEPENDENT_AMBULATORY_CARE_PROVIDER_SITE_OTHER): Payer: BC Managed Care – PPO | Admitting: Family Medicine

## 2023-03-28 VITALS — BP 138/83 | HR 66

## 2023-03-28 DIAGNOSIS — I10 Essential (primary) hypertension: Secondary | ICD-10-CM

## 2023-03-28 MED ORDER — LOSARTAN POTASSIUM 25 MG PO TABS: 25.0000 mg | ORAL_TABLET | Freq: Two times a day (BID) | ORAL | 0 refills | Status: DC

## 2023-03-28 NOTE — Progress Notes (Signed)
Pt here for Blood pressure check per Hyman Hopes, NP  Pt currently takes: metoprolol succinate , losartan , furosemide 67m (edema)   Pt reports compliance with medication.  BP Readings from Last 3 Encounters:  03/14/23 (!) 165/88  02/15/23 (!) 160/86  07/12/22 (!) 149/74     BP today Right arm 148/86  HR 71 Left arm 141/90  HR 72  Recheck Right arm 151/83 HR 63 Left arm 138/83 HR 66  Pt advised per Ladona Ridgel to increase losartan  to twice a day and blood pressure and blood work in 2 weeks.  New prescription sent in and appointments made.

## 2023-03-31 ENCOUNTER — Encounter: Payer: BC Managed Care – PPO | Admitting: Internal Medicine

## 2023-04-04 ENCOUNTER — Encounter: Payer: Self-pay | Admitting: Obstetrics & Gynecology

## 2023-04-04 ENCOUNTER — Encounter: Payer: Self-pay | Admitting: Family Medicine

## 2023-04-05 ENCOUNTER — Other Ambulatory Visit: Payer: Self-pay

## 2023-04-05 ENCOUNTER — Other Ambulatory Visit: Payer: Self-pay | Admitting: Obstetrics & Gynecology

## 2023-04-05 DIAGNOSIS — N939 Abnormal uterine and vaginal bleeding, unspecified: Secondary | ICD-10-CM

## 2023-04-05 MED ORDER — OMEPRAZOLE 40 MG PO CPDR: 40.0000 mg | DELAYED_RELEASE_CAPSULE | Freq: Every day | ORAL | 0 refills | Status: DC

## 2023-04-05 MED ORDER — MEGESTROL ACETATE 40 MG PO TABS: 40.0000 mg | ORAL_TABLET | Freq: Two times a day (BID) | ORAL | 8 refills | Status: DC

## 2023-04-10 NOTE — Progress Notes (Unsigned)
Pt here for Blood pressure check per Hyman Hopes "03/28/23: Pt advised per Ladona Ridgel to increase losartan 25mg  to twice a day and blood pressure and blood work in 2 weeks.  New prescription sent in and appointments made. "  Pt currently takes: Losartan 25mg  to twice a day Metoprolol 25 daily Lasix 20 mg 1-2 tabs as needed for Edema  Pt reports compliance with medication. -6 month f/u scheduled on 09/19/23 with Dr Abner Greenspan  BP today @ = 130/82 HR = 67  Pt advised per Ladona Ridgel: Continue current medication regimen. Keep scheduled appointment with Dr Abner Greenspan. Call/ send MyChart message if readings worsen in the meantime.

## 2023-04-11 ENCOUNTER — Other Ambulatory Visit (INDEPENDENT_AMBULATORY_CARE_PROVIDER_SITE_OTHER): Payer: BC Managed Care – PPO

## 2023-04-11 ENCOUNTER — Ambulatory Visit: Payer: BC Managed Care – PPO

## 2023-04-11 DIAGNOSIS — I10 Essential (primary) hypertension: Secondary | ICD-10-CM | POA: Diagnosis not present

## 2023-04-11 LAB — BASIC METABOLIC PANEL
BUN: 8 mg/dL (ref 6–23)
CO2: 28 mEq/L (ref 19–32)
Calcium: 9.8 mg/dL (ref 8.4–10.5)
Chloride: 103 mEq/L (ref 96–112)
Creatinine, Ser: 0.94 mg/dL (ref 0.40–1.20)
GFR: 71.87 mL/min (ref >60.00)
Glucose, Bld: 80 mg/dL (ref 70–99)
Potassium: 4.3 mEq/L (ref 3.5–5.1)
Sodium: 140 mEq/L (ref 135–145)

## 2023-04-17 ENCOUNTER — Encounter: Payer: Self-pay | Admitting: Family Medicine

## 2023-04-17 DIAGNOSIS — M545 Low back pain, unspecified: Secondary | ICD-10-CM

## 2023-04-18 ENCOUNTER — Other Ambulatory Visit: Payer: Self-pay | Admitting: Family Medicine

## 2023-04-18 DIAGNOSIS — M545 Low back pain, unspecified: Secondary | ICD-10-CM

## 2023-04-18 MED ORDER — HYDROCODONE-ACETAMINOPHEN 5-325 MG PO TABS
1.0000 | ORAL_TABLET | Freq: Three times a day (TID) | ORAL | 0 refills | Status: DC | PRN
Start: 2023-04-18 — End: 2023-05-22

## 2023-04-18 NOTE — Telephone Encounter (Signed)
Requesting: hydrocodone 5-325mg  Contract: 01/03/22 UDS: 01/03/22 Last Visit: 03/14/23 Next Visit: 09/19/23  Last Refill: 03/14/23 #30 and 0RF  Please Advise

## 2023-04-27 ENCOUNTER — Encounter: Payer: Self-pay | Admitting: Internal Medicine

## 2023-04-27 ENCOUNTER — Ambulatory Visit (INDEPENDENT_AMBULATORY_CARE_PROVIDER_SITE_OTHER): Payer: BC Managed Care – PPO | Admitting: Internal Medicine

## 2023-04-27 VITALS — BP 140/100 | HR 80 | Ht 67.0 in | Wt 388.5 lb

## 2023-04-27 DIAGNOSIS — Z1211 Encounter for screening for malignant neoplasm of colon: Secondary | ICD-10-CM | POA: Diagnosis not present

## 2023-04-27 DIAGNOSIS — Z6841 Body Mass Index (BMI) 40.0 and over, adult: Secondary | ICD-10-CM | POA: Diagnosis not present

## 2023-04-27 DIAGNOSIS — Z1212 Encounter for screening for malignant neoplasm of rectum: Secondary | ICD-10-CM | POA: Diagnosis not present

## 2023-04-27 MED ORDER — NA SULFATE-K SULFATE-MG SULF 17.5-3.13-1.6 GM/177ML PO SOLN: 1.0000 | Freq: Once | ORAL | 0 refills | Status: AC

## 2023-04-27 NOTE — H&P (View-Only) (Signed)
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  She was evaluated in the office by Amy Esterwood March 2021 regarding the same.  She was to be scheduled with Dr. Jacobs in the hospital given her BMI 59.  That procedure never occurred.  She was referred and screened by her previous nurses.  They reached out to me regarding scheduling.  I requested office evaluation to discuss all options for colon cancer screening and the risks, particular given her BMI.  Review of systems is entirely negative.  No change in bowel habits or bleeding.  No abdominal pain or unexplained weight loss.  No first-degree relatives with colon cancer.  She did have an uncle may have had colon cancer.  Review of blood work from April 2024 was unremarkable comprehensive metabolic panel.  Normal CBC with hemoglobin 14.1.  REVIEW OF SYSTEMS:  All non-GI ROS negative as otherwise stated in HPI except for anxiety, back pain, sleeping problems, lower extremity swelling  Past Medical History:  Diagnosis Date   Acid reflux    Allergic state 03/02/2012   Allergy last couple of years   seasonal   Anxiety    Back pain    Cervical cancer screening 03/02/2012   Chest pain    Chicken pox 3rd grade   GERD (gastroesophageal reflux disease)    GERD (gastroesophageal reflux disease)    Heartburn    HTN (hypertension) 12/09/2011   Hyperlipidemia    Hypertension    Inflamed skin tag 03/02/2012   Menorrhagia 05/04/2015   OAB (overactive bladder)    Obese 12/09/2011   Obesity    OSA (obstructive sleep apnea)    Ovarian cyst 08/17/2012   Palpitations    Pedal edema 07/11/2017   Sinusitis 01/08/2012   Swollen ankles    Swollen feet    Vaginal Pap smear, abnormal     Past Surgical History:  Procedure Laterality Date   cyst removed  47 yr old   benign, abdominal   HERNIA REPAIR      Social History Indica L Haseman   reports that she has never smoked. She has never used smokeless tobacco. She reports current alcohol use. She reports that she does not use drugs.  family history includes Cholelithiasis in her father; Colon polyps in her father; Diabetes in her father, mother, and paternal grandmother; Fibromyalgia in her mother; GER disease in her father; Heart attack in her paternal grandfather and paternal grandmother; Heart attack (age of onset: 23) in her father; Heart disease in her paternal grandfather and paternal grandmother; Heart disease (age of onset: 23) in her father; Hyperlipidemia in her father; Hypertension in her brother, father, mother, paternal grandfather, and paternal grandmother; Obesity in her father; Prostate cancer in her maternal uncle; Prostate cancer (age of onset: 74) in her father; Sleep apnea in her father; Stomach cancer in her maternal grandfather; Stroke in her father; Thyroid disease in her mother; Venous thrombosis in her brother and father.  Allergies  Allergen Reactions   Skelaxin Hypertension       PHYSICAL EXAMINATION: Vital signs: BP (!) 140/100 (BP Location: Left Wrist, Patient Position: Sitting, Cuff Size: Normal)   Pulse 80   Ht 5' 7" (1.702 m)   Wt (!) 388 lb 8 oz (176.2 kg)   BMI 60.85 kg/m   Constitutional: Pleasant, obese but generally well-appearing, no acute distress Psychiatric: alert and oriented x3, cooperative Eyes: extraocular   movements intact, anicteric, conjunctiva pink Mouth: oral pharynx moist, no lesions Neck: Thick but supple no lymphadenopathy Cardiovascular: heart regular rate and rhythm, no murmur Lungs: clear to auscultation bilaterally Abdomen: soft, obese, nontender, nondistended, no obvious ascites, no peritoneal signs, normal bowel sounds, no organomegaly Rectal: Deferred until colonoscopy Extremities: no clubbing or cyanosis.  Trace lower extremity edema bilaterally Skin: no lesions on visible extremities Neuro: No focal deficits.   Cranial nerves intact  ASSESSMENT:  1.  Colon cancer screening.  Average risk for colorectal neoplasia. 2.  Morbid obesity with BMI 60 3.  Multiple general medical problems.  Stable   PLAN:  1.  We discussed several colon cancer screening options for average risk patients.  These included FIT testing, Cologuard, and optical colonoscopy.  We discussed the nature, limitations, and risks of each strategy.  We discussed the implications of positive testing in each scenario.  Also discussed her high risk nature for optical colonoscopy as relates to sedation, given her BMI.  To this end, after balanced discussion, she has decided to go with possible colonoscopy.The nature of the procedure, as well as the risks, benefits, and alternatives were carefully and thoroughly reviewed with the patient. Ample time for discussion and questions allowed. The patient understood, was satisfied, and agreed to proceed.  This will be performed at the hospital with monitored anesthesia care given her BMI.      

## 2023-04-27 NOTE — Patient Instructions (Signed)
You have been scheduled for a colonoscopy. Please follow written instructions given to you at your visit today.  Please pick up your prep supplies at the pharmacy within the next 1-3 days. If you use inhalers (even only as needed), please bring them with you on the day of your procedure.  _______________________________________________________  If your blood pressure at your visit was 140/90 or greater, please contact your primary care physician to follow up on this.  _______________________________________________________  If you are age 81 or older, your body mass index should be between 23-30. Your Body mass index is 60.85 kg/m. If this is out of the aforementioned range listed, please consider follow up with your Primary Care Provider.  If you are age 31 or younger, your body mass index should be between 19-25. Your Body mass index is 60.85 kg/m. If this is out of the aformentioned range listed, please consider follow up with your Primary Care Provider.   ________________________________________________________  The [facility] providers would like to encourage you to use [facility] to communicate with providers for non-urgent requests or questions.  Due to long hold times on the telephone, sending your provider a message by [facility] may be a faster and more efficient way to get a response.  Please allow 48 business hours for a response.  Please remember that this is for non-urgent requests.  _______________________________________________________  Due to recent changes in healthcare laws, you may see the results of your imaging and laboratory studies on MyChart before your provider has had a chance to review them.  We understand that in some cases there may be results that are confusing or concerning to you. Not all laboratory results come back in the same time frame and the provider may be waiting for multiple results in order to interpret others.  Please give [place] 48 hours in order for your  provider to thoroughly review all the results before contacting the office for clarification of your results.

## 2023-04-27 NOTE — Progress Notes (Signed)
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  She was evaluated in the office by Christina Mendez March 2021 regarding the same.  She was to be scheduled with Dr. Christella Mendez in the hospital given her BMI 59.  That procedure never occurred.  She was referred and screened by her previous nurses.  They reached out to me regarding scheduling.  I requested office evaluation to discuss all options for colon cancer screening and the risks, particular given her BMI.  Review of systems is entirely negative.  No change in bowel habits or bleeding.  No abdominal pain or unexplained weight loss.  No first-degree relatives with colon cancer.  She did have an uncle may have had colon cancer.  Review of blood work from April 2024 was unremarkable comprehensive metabolic panel.  Normal CBC with hemoglobin 14.1.  REVIEW OF SYSTEMS:  All non-GI ROS negative as otherwise stated in HPI except for anxiety, back pain, sleeping problems, lower extremity swelling  Past Medical History:  Diagnosis Date   Acid reflux    Allergic state 03/02/2012   Allergy last couple of years   seasonal   Anxiety    Back pain    Cervical cancer screening 03/02/2012   Chest pain    Chicken pox 3rd grade   GERD (gastroesophageal reflux disease)    GERD (gastroesophageal reflux disease)    Heartburn    HTN (hypertension) 12/09/2011   Hyperlipidemia    Hypertension    Inflamed skin tag 03/02/2012   Menorrhagia 05/04/2015   OAB (overactive bladder)    Obese 12/09/2011   Obesity    OSA (obstructive sleep apnea)    Ovarian cyst 08/17/2012   Palpitations    Pedal edema 07/11/2017   Sinusitis 01/08/2012   Swollen ankles    Swollen feet    Vaginal Pap smear, abnormal     Past Surgical History:  Procedure Laterality Date   cyst removed  47 yr old   benign, abdominal   HERNIA REPAIR      Social History Christina Mendez   reports that she has never smoked. She has never used smokeless tobacco. She reports current alcohol use. She reports that she does not use drugs.  family history includes Cholelithiasis in her father; Colon polyps in her father; Diabetes in her father, mother, and paternal grandmother; Fibromyalgia in her mother; GER disease in her father; Heart attack in her paternal grandfather and paternal grandmother; Heart attack (age of onset: 2) in her father; Heart disease in her paternal grandfather and paternal grandmother; Heart disease (age of onset: 86) in her father; Hyperlipidemia in her father; Hypertension in her brother, father, mother, paternal grandfather, and paternal grandmother; Obesity in her father; Prostate cancer in her maternal uncle; Prostate cancer (age of onset: 73) in her father; Sleep apnea in her father; Stomach cancer in her maternal grandfather; Stroke in her father; Thyroid disease in her mother; Venous thrombosis in her brother and father.  Allergies  Allergen Reactions   Skelaxin Hypertension       PHYSICAL EXAMINATION: Vital signs: BP (!) 140/100 (BP Location: Left Wrist, Patient Position: Sitting, Cuff Size: Normal)   Pulse 80   Ht 5\' 7"  (1.702 m)   Wt (!) 388 lb 8 oz (176.2 kg)   BMI 60.85 kg/m   Constitutional: Pleasant, obese but generally well-appearing, no acute distress Psychiatric: alert and oriented x3, cooperative Eyes: extraocular  movements intact, anicteric, conjunctiva pink Mouth: oral pharynx moist, no lesions Neck: Thick but supple no lymphadenopathy Cardiovascular: heart regular rate and rhythm, no murmur Lungs: clear to auscultation bilaterally Abdomen: soft, obese, nontender, nondistended, no obvious ascites, no peritoneal signs, normal bowel sounds, no organomegaly Rectal: Deferred until colonoscopy Extremities: no clubbing or cyanosis.  Trace lower extremity edema bilaterally Skin: no lesions on visible extremities Neuro: No focal deficits.   Cranial nerves intact  ASSESSMENT:  1.  Colon cancer screening.  Average risk for colorectal neoplasia. 2.  Morbid obesity with BMI 60 3.  Multiple general medical problems.  Stable   PLAN:  1.  We discussed several colon cancer screening options for average risk patients.  These included FIT testing, Cologuard, and optical colonoscopy.  We discussed the nature, limitations, and risks of each strategy.  We discussed the implications of positive testing in each scenario.  Also discussed her high risk nature for optical colonoscopy as relates to sedation, given her BMI.  To this end, after balanced discussion, she has decided to go with possible colonoscopy.The nature of the procedure, as well as the risks, benefits, and alternatives were carefully and thoroughly reviewed with the patient. Ample time for discussion and questions allowed. The patient understood, was satisfied, and agreed to proceed.  This will be performed at the hospital with monitored anesthesia care given her BMI.

## 2023-05-09 ENCOUNTER — Other Ambulatory Visit: Payer: Self-pay

## 2023-05-09 ENCOUNTER — Encounter (HOSPITAL_COMMUNITY): Payer: Self-pay | Admitting: Internal Medicine

## 2023-05-13 NOTE — Anesthesia Preprocedure Evaluation (Signed)
Anesthesia Evaluation  Patient identified by MRN, date of birth, ID band Patient awake    Reviewed: Allergy & Precautions, NPO status , Patient's Chart, lab work & pertinent test results  History of Anesthesia Complications Negative for: history of anesthetic complications  Airway Mallampati: I  TM Distance: >3 FB Neck ROM: Full    Dental  (+) Dental Advisory Given   Pulmonary neg shortness of breath, sleep apnea and Continuous Positive Airway Pressure Ventilation , neg COPD, neg recent URI   Pulmonary exam normal breath sounds clear to auscultation       Cardiovascular hypertension (losartan, furosemide, metoprolol), Pt. on medications and Pt. on home beta blockers (-) angina (-) Past MI, (-) Cardiac Stents and (-) CABG + dysrhythmias (palpitations)  Rhythm:Regular Rate:Normal  HLD   Neuro/Psych neg Seizures PSYCHIATRIC DISORDERS Anxiety     negative neurological ROS     GI/Hepatic Neg liver ROS, Bowel prep,GERD  Medicated,,  Endo/Other    Morbid obesityPrediabetes  Renal/GU negative Renal ROS Bladder dysfunction      Musculoskeletal   Abdominal  (+) + obese  Peds  Hematology   Anesthesia Other Findings   Reproductive/Obstetrics                             Anesthesia Physical Anesthesia Plan  ASA: 3  Anesthesia Plan: MAC   Post-op Pain Management:    Induction: Intravenous  PONV Risk Score and Plan: 2 and Propofol infusion and Treatment may vary due to age or medical condition  Airway Management Planned: Natural Airway and Nasal Cannula  Additional Equipment:   Intra-op Plan:   Post-operative Plan:   Informed Consent: I have reviewed the patients History and Physical, chart, labs and discussed the procedure including the risks, benefits and alternatives for the proposed anesthesia with the patient or authorized representative who has indicated his/her understanding and  acceptance.     Dental advisory given  Plan Discussed with: CRNA and Anesthesiologist  Anesthesia Plan Comments: (Discussed with patient risks of MAC including, but not limited to, minor pain or discomfort, hearing people in the room, and possible need for backup general anesthesia. Risks for general anesthesia also discussed including, but not limited to, sore throat, hoarse voice, chipped/damaged teeth, injury to vocal cords, nausea and vomiting, allergic reactions, lung infection, heart attack, stroke, and death. All questions answered. )        Anesthesia Quick Evaluation

## 2023-05-15 ENCOUNTER — Ambulatory Visit (HOSPITAL_COMMUNITY): Payer: BC Managed Care – PPO | Admitting: Anesthesiology

## 2023-05-15 ENCOUNTER — Ambulatory Visit (HOSPITAL_COMMUNITY)
Admission: RE | Admit: 2023-05-15 | Discharge: 2023-05-15 | Disposition: A | Discharge status: Home / Self Care | Payer: BC Managed Care – PPO | Attending: Internal Medicine | Admitting: Internal Medicine

## 2023-05-15 ENCOUNTER — Other Ambulatory Visit: Payer: Self-pay

## 2023-05-15 ENCOUNTER — Encounter (HOSPITAL_COMMUNITY): Payer: Self-pay | Admitting: Internal Medicine

## 2023-05-15 ENCOUNTER — Encounter (HOSPITAL_COMMUNITY)
Admission: RE | Disposition: A | Discharge status: Home / Self Care | Payer: Self-pay | Source: Home / Self Care | Attending: Internal Medicine

## 2023-05-15 DIAGNOSIS — Z1211 Encounter for screening for malignant neoplasm of colon: Secondary | ICD-10-CM | POA: Diagnosis not present

## 2023-05-15 DIAGNOSIS — R7303 Prediabetes: Secondary | ICD-10-CM | POA: Insufficient documentation

## 2023-05-15 DIAGNOSIS — I10 Essential (primary) hypertension: Secondary | ICD-10-CM | POA: Insufficient documentation

## 2023-05-15 DIAGNOSIS — G4730 Sleep apnea, unspecified: Secondary | ICD-10-CM | POA: Diagnosis not present

## 2023-05-15 DIAGNOSIS — Z79899 Other long term (current) drug therapy: Secondary | ICD-10-CM | POA: Diagnosis not present

## 2023-05-15 DIAGNOSIS — Z6841 Body Mass Index (BMI) 40.0 and over, adult: Secondary | ICD-10-CM | POA: Insufficient documentation

## 2023-05-15 DIAGNOSIS — G4733 Obstructive sleep apnea (adult) (pediatric): Secondary | ICD-10-CM | POA: Insufficient documentation

## 2023-05-15 DIAGNOSIS — Z9989 Dependence on other enabling machines and devices: Secondary | ICD-10-CM | POA: Diagnosis not present

## 2023-05-15 HISTORY — PX: COLONOSCOPY WITH PROPOFOL: SHX5780

## 2023-05-15 SURGERY — COLONOSCOPY WITH PROPOFOL
Anesthesia: Monitor Anesthesia Care

## 2023-05-15 MED ORDER — PROPOFOL 500 MG/50ML IV EMUL
INTRAVENOUS | Status: DC | PRN
  Administered 2023-05-15: 100 ug/kg/min via INTRAVENOUS

## 2023-05-15 MED ORDER — DEXMEDETOMIDINE HCL IN NACL 80 MCG/20ML IV SOLN
INTRAVENOUS | Status: DC | PRN
  Administered 2023-05-15 (×2): 4 ug via INTRAVENOUS

## 2023-05-15 MED ORDER — LIDOCAINE 2% (20 MG/ML) 5 ML SYRINGE
INTRAMUSCULAR | Status: DC | PRN
  Administered 2023-05-15: 100 mg via INTRAVENOUS

## 2023-05-15 MED ORDER — PROPOFOL 1000 MG/100ML IV EMUL
INTRAVENOUS | Status: AC
  Filled 2023-05-15: qty 100

## 2023-05-15 MED ORDER — PROPOFOL 10 MG/ML IV BOLUS
INTRAVENOUS | Status: DC | PRN
  Administered 2023-05-15: 10 mg via INTRAVENOUS
  Administered 2023-05-15 (×3): 20 mg via INTRAVENOUS
  Administered 2023-05-15: 30 mg via INTRAVENOUS
  Administered 2023-05-15: 50 mg via INTRAVENOUS

## 2023-05-15 MED ORDER — LACTATED RINGERS IV SOLN: INTRAVENOUS | Status: DC | PRN

## 2023-05-15 SURGICAL SUPPLY — 22 items

## 2023-05-15 NOTE — Discharge Instructions (Signed)
YOU HAD AN ENDOSCOPIC PROCEDURE TODAY: Refer to the procedure report and other information in the discharge instructions given to you for any specific questions about what was found during the examination. If this information does not answer your questions, please call [place] office at [phone] to clarify.  ° °YOU SHOULD EXPECT: Some feelings of bloating in the abdomen. Passage of more gas than usual. Walking can help get rid of the air that was put into your GI tract during the procedure and reduce the bloating. If you had a lower endoscopy (such as a colonoscopy or flexible sigmoidoscopy) you may notice spotting of blood in your stool or on the toilet paper. Some abdominal soreness may be present for a day or two, also. ° °DIET: Your first meal following the procedure should be a light meal and then it is ok to progress to your normal diet. A half-sandwich or bowl of soup is an example of a good first meal. Heavy or fried foods are harder to digest and may make you feel nauseous or bloated. Drink plenty of fluids but you should avoid alcoholic beverages for 24 hours. If you had a esophageal dilation, please see attached instructions for diet.   ° °ACTIVITY: Your care partner should take you home directly after the procedure. You should plan to take it easy, moving slowly for the rest of the day. You can resume normal activity the day after the procedure however YOU SHOULD NOT DRIVE, use power tools, machinery or perform tasks that involve climbing or major physical exertion for 24 hours (because of the sedation medicines used during the test).  ° °SYMPTOMS TO REPORT IMMEDIATELY: °A gastroenterologist can be reached at any hour. Please call [phone]  for any of the following symptoms:  °Following lower endoscopy (colonoscopy, flexible sigmoidoscopy) °Excessive amounts of blood in the stool  °Significant tenderness, worsening of abdominal pains  °Swelling of the abdomen that is new, acute  °Fever of 100° or  higher  °Following upper endoscopy (EGD, EUS, ERCP, esophageal dilation) °Vomiting of blood or coffee ground material  °New, significant abdominal pain  °New, significant chest pain or pain under the shoulder blades  °Painful or persistently difficult swallowing  °New shortness of breath  °Black, tarry-looking or red, bloody stools ° °FOLLOW UP:  °If any biopsies were taken you will be contacted by phone or by letter within the next 1-3 weeks. Call [phone]  if you have not heard about the biopsies in 3 weeks.  °Please also call with any specific questions about appointments or follow up tests. ° °

## 2023-05-15 NOTE — Anesthesia Postprocedure Evaluation (Signed)
Anesthesia Post Note  Patient: Christina Mendez  Procedure(s) Performed: COLONOSCOPY WITH PROPOFOL     Patient location during evaluation: PACU Anesthesia Type: MAC Level of consciousness: awake Pain management: pain level controlled Vital Signs Assessment: post-procedure vital signs reviewed and stable Respiratory status: spontaneous breathing, nonlabored ventilation and respiratory function stable Cardiovascular status: stable and blood pressure returned to baseline Postop Assessment: no apparent nausea or vomiting Anesthetic complications: no   No notable events documented.  Last Vitals:  Vitals:   05/15/23 1004 05/15/23 1011  BP: (!) 144/72 138/76  Pulse: 82 73  Resp: (!) 29 (!) 21  Temp: 36.6 C   SpO2: 97% 97%    Last Pain:  Vitals:   05/15/23 1011  TempSrc:   PainSc: 0-No pain                 Linton Rump

## 2023-05-15 NOTE — Transfer of Care (Signed)
Immediate Anesthesia Transfer of Care Note  Patient: Christina Mendez  Procedure(s) Performed: Procedure(s): COLONOSCOPY WITH PROPOFOL (N/A)  Patient Location: PACU  Anesthesia Type:MAC  Level of Consciousness: Patient easily awoken, sedated, comfortable, cooperative, following commands, responds to stimulation.   Airway & Oxygen Therapy: Patient spontaneously breathing, ventilating well, oxygen via simple oxygen mask.  Post-op Assessment: Report given to PACU RN, vital signs reviewed and stable, moving all extremities.   Post vital signs: Reviewed and stable.  Complications: No apparent anesthesia complications Last Vitals:  Vitals Value Taken Time  BP 144/72 05/15/23 1006  Temp    Pulse 80 05/15/23 1007  Resp 27 05/15/23 1007  SpO2 100 % 05/15/23 1007  Vitals shown include unvalidated device data.  Last Pain:  Vitals:   05/15/23 0827  TempSrc: Tympanic  PainSc: 0-No pain         Complications: No notable events documented.

## 2023-05-15 NOTE — Op Note (Signed)
[facility] Patient Name: Christina Mendez Procedure Date: 05/15/2023 MRN: [id number] Attending MD: Wilhemina Bonito. Marina Goodell , MD, [id number] Date of Birth: 1976/04/23 CSN: [id number] Age: 47 Admit Type: Outpatient Procedure:                Colonoscopy Indications:              Screening for colorectal malignant neoplasm Providers:                Wilhemina Bonito. Marina Goodell, MD, Martha Clan, RN, Marge Duncans, RN, Sunday Corn Mbumina, Technician Referring MD:             Bryon Lions. Abner Greenspan, MD Medicines:                Monitored Anesthesia Care Complications:            No immediate complications. Estimated blood loss:                            None. Estimated Blood Loss:     Estimated blood loss: none. Procedure:                Pre-Anesthesia Assessment:                           - Prior to the procedure, a History and Physical                            was performed, and patient medications and                            allergies were reviewed. The patient's tolerance of                            previous anesthesia was also reviewed. The risks                            and benefits of the procedure and the sedation                            options and risks were discussed with the patient.                            All questions were answered, and informed consent                            was obtained. Prior Anticoagulants: The patient has                            taken no anticoagulant or antiplatelet agents. ASA                            Grade Assessment: II - A patient with mild systemic  disease. After reviewing the risks and benefits,                            the patient was deemed in satisfactory condition to                            undergo the procedure.                           After obtaining informed consent, the colonoscope                            was passed under direct vision. Throughout the                             procedure, the patient's blood pressure, pulse, and                            oxygen saturations were monitored continuously. The                            CF-HQ190L ([id number]) Olympus colonoscope was                            introduced through the anus and advanced to the the                            cecum, identified by appendiceal orifice and                            ileocecal valve. The ileocecal valve, appendiceal                            orifice, and rectum were photographed. The quality                            of the bowel preparation was excellent. The                            colonoscopy was performed without difficulty. The                            patient tolerated the procedure well. The bowel                            preparation used was SUPREP via split dose                            instruction. Scope In: 9:47:20 AM Scope Out: 9:55:57 AM Scope Withdrawal Time: 0 hours 6 minutes 22 seconds  Total Procedure Duration: 0 hours 8 minutes 37 seconds  Findings:      The entire examined colon appeared normal on direct and retroflexion       views. Impression:               -  The entire examined colon is normal on direct and                            retroflexion views.                           - No specimens collected. Moderate Sedation:      none Recommendation:           - Repeat colonoscopy in 10 years for screening                            purposes.                           - Patient has a contact number available for                            emergencies. The signs and symptoms of potential                            delayed complications were discussed with the                            patient. Return to normal activities tomorrow.                            Written discharge instructions were provided to the                            patient.                           - Resume previous diet.                           -  Continue present medications. Procedure Code(s):        --- Professional ---                           [phone], Colonoscopy, flexible; diagnostic, including                            collection of specimen(s) by brushing or washing,                            when performed (separate procedure) Diagnosis Code(s):        --- Professional ---                           Z12.11, Encounter for screening for malignant                            neoplasm of colon CPT copyright 2022 [employer]. All rights reserved. The codes documented in this report are preliminary and upon coder review may  be revised to meet current compliance requirements. Wilhemina Bonito. Marina Goodell, MD 05/15/2023 10:03:41 AM This  report has been signed electronically. Number of Addenda: 0

## 2023-05-15 NOTE — Anesthesia Procedure Notes (Signed)
Procedure Name: MAC Date/Time: 05/15/2023 9:40 AM  Performed by: Ludwig Lean, CRNAPre-anesthesia Checklist: Patient identified, Emergency Drugs available, Suction available and Patient being monitored Patient Re-evaluated:Patient Re-evaluated prior to induction Oxygen Delivery Method: Simple face mask Placement Confirmation: positive ETCO2 and breath sounds checked- equal and bilateral

## 2023-05-15 NOTE — Interval H&P Note (Signed)
History and Physical Interval Note:  05/15/2023 9:32 AM  [name]  has presented today for surgery, with the diagnosis of colon cancer screening.  The various methods of treatment have been discussed with the patient and family. After consideration of risks, benefits and other options for treatment, the patient has consented to  Procedure(s): COLONOSCOPY WITH PROPOFOL (N/A) as a surgical intervention.  The patient's history has been reviewed, patient examined, no change in status, stable for surgery.  I have reviewed the patient's chart and labs.  Questions were answered to the patient's satisfaction.     Christina Mendez

## 2023-05-18 ENCOUNTER — Encounter (HOSPITAL_COMMUNITY): Payer: Self-pay | Admitting: Internal Medicine

## 2023-05-22 ENCOUNTER — Other Ambulatory Visit: Payer: Self-pay | Admitting: Family Medicine

## 2023-05-22 DIAGNOSIS — G8929 Other chronic pain: Secondary | ICD-10-CM

## 2023-05-22 MED ORDER — HYDROCODONE-ACETAMINOPHEN 5-325 MG PO TABS
1.0000 | ORAL_TABLET | Freq: Three times a day (TID) | ORAL | 0 refills | Status: DC | PRN
Start: 2023-05-22 — End: 2023-06-21

## 2023-05-22 MED ORDER — METOPROLOL SUCCINATE ER 25 MG PO TB24: 25.0000 mg | ORAL_TABLET | Freq: Every day | ORAL | 1 refills | Status: DC

## 2023-05-22 NOTE — Telephone Encounter (Signed)
Requesting: hydrocodone 5-325mg   Contract: 01/03/22 UDS: 01/03/22 Last Visit: 03/14/23 Next Visit: 09/19/23 Last Refill: 04/18/23 #30 and 0RF   Please Advise

## 2023-06-10 ENCOUNTER — Encounter: Payer: Self-pay | Admitting: Obstetrics & Gynecology

## 2023-06-14 ENCOUNTER — Other Ambulatory Visit: Payer: Self-pay | Admitting: Obstetrics & Gynecology

## 2023-06-14 DIAGNOSIS — N939 Abnormal uterine and vaginal bleeding, unspecified: Secondary | ICD-10-CM

## 2023-06-14 MED ORDER — MEGESTROL ACETATE 40 MG PO TABS: 40.0000 mg | ORAL_TABLET | Freq: Two times a day (BID) | ORAL | 8 refills | Status: DC

## 2023-06-21 ENCOUNTER — Other Ambulatory Visit: Payer: Self-pay | Admitting: Family Medicine

## 2023-06-21 DIAGNOSIS — G8929 Other chronic pain: Secondary | ICD-10-CM

## 2023-06-22 MED ORDER — HYDROCODONE-ACETAMINOPHEN 5-325 MG PO TABS
1.0000 | ORAL_TABLET | Freq: Three times a day (TID) | ORAL | 0 refills | Status: DC | PRN
Start: 2023-06-22 — End: 2023-07-13

## 2023-06-22 NOTE — Telephone Encounter (Signed)
Requesting: hydrocodone 5-325mg   Contract: 02/01/22 UDS: 01/03/22 Last Visit: 03/14/23 w/ Ladona Ridgel Next Visit: 09/19/23 Last Refill: 05/22/23 #30 and 0RF   Please Advise

## 2023-07-01 ENCOUNTER — Other Ambulatory Visit: Payer: Self-pay | Admitting: Family Medicine

## 2023-07-13 ENCOUNTER — Other Ambulatory Visit: Payer: Self-pay | Admitting: Family Medicine

## 2023-07-13 DIAGNOSIS — M545 Low back pain, unspecified: Secondary | ICD-10-CM

## 2023-07-14 MED ORDER — HYDROCODONE-ACETAMINOPHEN 5-325 MG PO TABS
1.0000 | ORAL_TABLET | Freq: Three times a day (TID) | ORAL | 0 refills | Status: DC | PRN
Start: 2023-07-14 — End: 2023-08-20

## 2023-07-14 NOTE — Telephone Encounter (Signed)
Requesting: hydrocodone 5-325mg   Contract: 01/03/22 UDS: 01/03/22 Last Visit: 03/14/23 Next Visit:  09/19/23 Last Refill: 06/22/23 #30 and 0RF   Please Advise

## 2023-07-22 ENCOUNTER — Other Ambulatory Visit: Payer: Self-pay | Admitting: Family Medicine

## 2023-07-24 MED ORDER — LOSARTAN POTASSIUM 25 MG PO TABS: 25.0000 mg | ORAL_TABLET | Freq: Two times a day (BID) | ORAL | 0 refills | Status: DC

## 2023-08-20 ENCOUNTER — Other Ambulatory Visit: Payer: Self-pay | Admitting: Family Medicine

## 2023-08-20 DIAGNOSIS — G8929 Other chronic pain: Secondary | ICD-10-CM

## 2023-08-21 MED ORDER — HYDROCODONE-ACETAMINOPHEN 5-325 MG PO TABS
1.0000 | ORAL_TABLET | Freq: Three times a day (TID) | ORAL | 0 refills | Status: AC | PRN
Start: 2023-08-21 — End: ?

## 2023-08-21 NOTE — Telephone Encounter (Signed)
Requesting: hydrocodone 5-325mg   Contract: 02/01/22 UDS: 01/03/22 Last Visit: 03/14/23 Next Visit: 09/19/23 Last Refill: 07/14/23 #30 and 0RF   Please Advise

## 2023-09-12 ENCOUNTER — Other Ambulatory Visit: Payer: Self-pay | Admitting: Family Medicine

## 2023-09-12 DIAGNOSIS — M545 Low back pain, unspecified: Secondary | ICD-10-CM

## 2023-09-13 MED ORDER — HYDROCODONE-ACETAMINOPHEN 5-325 MG PO TABS
1.0000 | ORAL_TABLET | Freq: Three times a day (TID) | ORAL | 0 refills | Status: DC | PRN
Start: 2023-09-13 — End: 2024-04-24

## 2023-09-13 NOTE — Telephone Encounter (Signed)
Requesting: hydrocodone 5-325mg  Contract: 01/03/22 UDS: 01/03/22 Last Visit: 03/28/23  w/ Ladona Ridgel Next Visit:  09/19/23 Last Refill: 08/21/23 #30 and 0RF   Please Advise

## 2023-09-17 DIAGNOSIS — R739 Hyperglycemia, unspecified: Secondary | ICD-10-CM | POA: Insufficient documentation

## 2023-09-17 NOTE — Assessment & Plan Note (Signed)
Encourage heart healthy diet such as MIND or DASH diet, increase exercise, avoid trans fats, simple carbohydrates and processed foods, consider a krill or fish or flaxseed oil cap daily.  °

## 2023-09-17 NOTE — Assessment & Plan Note (Signed)
Encouraged DASH or MIND diet, decrease po intake and increase exercise as tolerated. Needs 7-8 hours of sleep nightly. Avoid trans fats, eat small, frequent meals every 4-5 hours with lean proteins, complex carbs and healthy fats. Minimize simple carbs, high fat foods and processed foods 

## 2023-09-17 NOTE — Assessment & Plan Note (Signed)
Well controlled, no changes to meds. Encouraged heart healthy diet such as the DASH diet and exercise as tolerated.  °

## 2023-09-17 NOTE — Assessment & Plan Note (Signed)
Supplement and monitor 

## 2023-09-17 NOTE — Assessment & Plan Note (Signed)
Avoid offending foods, start probiotics. Do not eat large meals in late evening and consider raising head of bed.  

## 2023-09-17 NOTE — Assessment & Plan Note (Signed)
hgba1c acceptable, minimize simple carbs. Increase exercise as tolerated.  

## 2023-09-19 ENCOUNTER — Ambulatory Visit (INDEPENDENT_AMBULATORY_CARE_PROVIDER_SITE_OTHER): Payer: BC Managed Care – PPO | Admitting: Family Medicine

## 2023-09-19 ENCOUNTER — Other Ambulatory Visit (HOSPITAL_BASED_OUTPATIENT_CLINIC_OR_DEPARTMENT_OTHER): Payer: Self-pay

## 2023-09-19 VITALS — BP 130/74 | HR 73 | Temp 98.0°F | Resp 16 | Ht 67.0 in | Wt 395.8 lb

## 2023-09-19 DIAGNOSIS — Z79899 Other long term (current) drug therapy: Secondary | ICD-10-CM

## 2023-09-19 DIAGNOSIS — Z23 Encounter for immunization: Secondary | ICD-10-CM

## 2023-09-19 DIAGNOSIS — I10 Essential (primary) hypertension: Secondary | ICD-10-CM | POA: Diagnosis not present

## 2023-09-19 DIAGNOSIS — K219 Gastro-esophageal reflux disease without esophagitis: Secondary | ICD-10-CM

## 2023-09-19 DIAGNOSIS — E559 Vitamin D deficiency, unspecified: Secondary | ICD-10-CM | POA: Diagnosis not present

## 2023-09-19 DIAGNOSIS — E782 Mixed hyperlipidemia: Secondary | ICD-10-CM

## 2023-09-19 DIAGNOSIS — R739 Hyperglycemia, unspecified: Secondary | ICD-10-CM | POA: Diagnosis not present

## 2023-09-19 LAB — TSH: TSH: 0.62 u[IU]/mL (ref 0.35–5.50)

## 2023-09-19 LAB — CBC WITH DIFFERENTIAL/PLATELET
Basophils Absolute: 0 10*3/uL (ref 0.0–0.1)
Basophils Relative: 0.8 % (ref 0.0–3.0)
Eosinophils Absolute: 0.1 10*3/uL (ref 0.0–0.7)
Eosinophils Relative: 1.4 % (ref 0.0–5.0)
HCT: 43.5 % (ref 36.0–46.0)
Hemoglobin: 14.6 g/dL (ref 12.0–15.0)
Lymphocytes Relative: 32.4 % (ref 12.0–46.0)
Lymphs Abs: 1.8 10*3/uL (ref 0.7–4.0)
MCHC: 33.5 g/dL (ref 30.0–36.0)
MCV: 91.7 fL (ref 78.0–100.0)
Monocytes Absolute: 0.4 10*3/uL (ref 0.1–1.0)
Monocytes Relative: 6.5 % (ref 3.0–12.0)
Neutro Abs: 3.3 10*3/uL (ref 1.4–7.7)
Neutrophils Relative %: 58.9 % (ref 43.0–77.0)
Platelets: 282 10*3/uL (ref 150.0–400.0)
RBC: 4.74 Mil/uL (ref 3.87–5.11)
RDW: 13.5 % (ref 11.5–15.5)
WBC: 5.6 10*3/uL (ref 4.0–10.5)

## 2023-09-19 LAB — COMPREHENSIVE METABOLIC PANEL
ALT: 22 U/L (ref 0–35)
AST: 16 U/L (ref 0–37)
Albumin: 4.3 g/dL (ref 3.5–5.2)
Alkaline Phosphatase: 73 U/L (ref 39–117)
BUN: 8 mg/dL (ref 6–23)
CO2: 27 meq/L (ref 19–32)
Calcium: 9.6 mg/dL (ref 8.4–10.5)
Chloride: 104 meq/L (ref 96–112)
Creatinine, Ser: 0.92 mg/dL (ref 0.40–1.20)
GFR: 73.52 mL/min (ref >60.00)
Glucose, Bld: 101 mg/dL — ABNORMAL HIGH (ref 70–99)
Potassium: 4.1 meq/L (ref 3.5–5.1)
Sodium: 141 meq/L (ref 135–145)
Total Bilirubin: 0.6 mg/dL (ref 0.2–1.2)
Total Protein: 7 g/dL (ref 6.0–8.3)

## 2023-09-19 LAB — LIPID PANEL
Cholesterol: 170 mg/dL (ref 0–200)
HDL: 52.1 mg/dL (ref >39.00)
LDL Cholesterol: 103 mg/dL — ABNORMAL HIGH (ref 0–99)
NonHDL: 117.97
Total CHOL/HDL Ratio: 3
Triglycerides: 74 mg/dL (ref 0.0–149.0)
VLDL: 14.8 mg/dL (ref 0.0–40.0)

## 2023-09-19 LAB — VITAMIN D 25 HYDROXY (VIT D DEFICIENCY, FRACTURES): VITD: 48.65 ng/mL (ref 30.00–100.00)

## 2023-09-19 LAB — HEMOGLOBIN A1C: Hgb A1c MFr Bld: 5.9 % (ref 4.6–6.5)

## 2023-09-19 MED ORDER — GABAPENTIN 100 MG PO CAPS: ORAL_CAPSULE | ORAL | 2 refills | Status: DC

## 2023-09-19 MED ORDER — COVID-19 MRNA VAC-TRIS(PFIZER) 30 MCG/0.3ML IM SUSY
0.3000 mL | PREFILLED_SYRINGE | Freq: Once | INTRAMUSCULAR | 0 refills | Status: AC
  Filled 2023-09-19: qty 0.3, 1d supply, fill #0

## 2023-09-19 NOTE — Patient Instructions (Addendum)
[employer] live to [employer] [place]   Perimenopause Perimenopause is the normal time of a woman's life when the levels of estrogen, the female hormone produced by the ovaries, begin to decrease. This leads to changes in menstrual periods before they stop completely (menopause). Perimenopause can begin 2-8 years before menopause. During perimenopause, the ovaries may or may not produce an egg and a woman can still become pregnant. What are the causes? This condition is caused by a natural change in hormone levels that happens as you get older. What increases the risk? This condition is more likely to start at an earlier age if you have certain medical conditions or have undergone treatments, including: A tumor of the pituitary gland in the brain. A disease that affects the ovaries and hormone production. Certain cancer treatments, such as chemotherapy or hormone therapy, or radiation therapy on the pelvis. Heavy smoking and excessive alcohol use. Family history of early menopause. What are the signs or symptoms? Perimenopausal changes affect each woman differently. Symptoms of this condition may include: Hot flashes. Irregular menstrual periods. Night sweats. Changes in feelings about sex. This could be a decrease in sex drive or an increased discomfort around your sexuality. Vaginal dryness. Headaches. Mood swings. Depression. Problems sleeping (insomnia). Memory problems or trouble concentrating. Irritability. Tiredness. Weight gain. Anxiety. Trouble getting pregnant. How is this diagnosed? This condition is diagnosed based on your medical history, a physical exam, your age, your menstrual history, and your symptoms. Hormone tests may also be done. How is this treated? In some cases, no treatment is needed. You and your health care provider should make a decision together about whether treatment is necessary. Treatment will be based on your individual condition and preferences.  Various treatments are available, such as: Menopausal hormone therapy (MHT). Medicines to treat specific symptoms. Acupuncture. Vitamin or herbal supplements. Before starting treatment, make sure to let your health care provider know if you have a personal or family history of: Heart disease. Breast cancer. Blood clots. Diabetes. Osteoporosis. Follow these instructions at home: Medicines Take over-the-counter and prescription medicines only as told by your health care provider. Take vitamin supplements only as told by your health care provider. Talk with your health care provider before starting any herbal supplements. Lifestyle  Do not use any products that contain nicotine or tobacco, such as cigarettes, e-cigarettes, and chewing tobacco. If you need help quitting, ask your health care provider. Get at least 30 minutes of physical activity on 5 or more days each week. Eat a balanced diet that includes fresh fruits and vegetables, whole grains, soybeans, eggs, lean meat, and low-fat dairy. Avoid alcoholic and caffeinated beverages, as well as spicy foods. This may help prevent hot flashes. Get 7-8 hours of sleep each night. Dress in layers that can be removed to help you manage hot flashes. Find ways to manage stress, such as deep breathing, meditation, or journaling. General instructions  Keep track of your menstrual periods, including: When they occur. How heavy they are and how long they last. How much time passes between periods. Keep track of your symptoms, noting when they start, how often you have them, and how long they last. Use vaginal lubricants or moisturizers to help with vaginal dryness and improve comfort during sex. You can still become pregnant if you are having irregular periods. Make sure you use contraception during perimenopause if you do not want to get pregnant. Keep all follow-up visits. This is important. This includes any group therapy or  counseling. Contact a health care provider if: You have heavy vaginal bleeding or pass blood clots. Your period lasts more than 2 days longer than normal. Your periods are recurring sooner than 21 days. You bleed after having sex. You have pain during sex. Get help right away if you have: Chest pain, trouble breathing, or trouble talking. Severe depression. Pain when you urinate. Severe headaches. Vision problems. Summary Perimenopause is the time when a woman's body begins to move into menopause. This may happen naturally or as a result of other health problems or medical treatments. Perimenopause can begin 2-8 years before menopause, and it can last for several years. Perimenopausal symptoms can be managed through medicines, lifestyle changes, and complementary therapies such as acupuncture. This information is not intended to replace advice given to you by your health care provider. Make sure you discuss any questions you have with your health care provider. Document Revised: 05/14/2020 Document Reviewed: 05/14/2020 Elsevier Patient Education  2024 [employer].

## 2023-09-20 ENCOUNTER — Encounter: Payer: Self-pay | Admitting: Family Medicine

## 2023-09-20 NOTE — Progress Notes (Signed)
Subjective:    Patient ID: Christina Mendez, female    DOB: Jun 14, 1976, 47 y.o.   MRN: [id number]  Chief Complaint  Patient presents with  . Follow-up    Follow up    HPI Discussed the use of AI scribe software for clinical note transcription with the patient, who gave verbal consent to proceed.  History of Present Illness   The patient, with a history of obesity and perimenopause, presents with worsening perimenopausal symptoms, including hot flashes and night sweats, which have been disrupting her sleep. She reports trying over-the-counter remedies such as melatonin and Z-Quil, as well as cyclobenzaprine, with limited success. She also experiences hot flashes during the day, but finds them more tolerable than the night sweats.  In addition to her perimenopausal symptoms, the patient is struggling with weight gain and decreased mobility. She reports difficulty moving, particularly after sitting for extended periods, and describes feelings of stiffness and heaviness in her feet and knees. She notes that once she gets moving, she is fine, but the initial effort to start moving is challenging. She also mentions a recent trip where she walked [place] for several hours, after which her mobility issues worsened.  The patient expresses a desire to increase her physical activity, particularly swimming, but notes financial barriers to accessing a pool. She also acknowledges the need to address her weight, and discusses the possibility of bariatric surgery, but requests time to consider this option.        Past Medical History:  Diagnosis Date  . Acid reflux   . Allergic state 03/02/2012  . Allergy last couple of years   seasonal  . Anxiety   . Back pain   . Cervical cancer screening 03/02/2012  . Chest pain   . Chicken pox 3rd grade  . GERD (gastroesophageal reflux disease)   . GERD (gastroesophageal reflux disease)   . Heartburn   . HTN (hypertension) 12/09/2011  .  Hyperlipidemia   . Hypertension   . Inflamed skin tag 03/02/2012  . Menorrhagia 05/04/2015  . OAB (overactive bladder)   . Obese 12/09/2011  . Obesity   . OSA (obstructive sleep apnea)    cpap  . Ovarian cyst 08/17/2012  . Palpitations   . Pedal edema 07/11/2017  . Sinusitis 01/08/2012  . Swollen ankles   . Swollen feet   . Vaginal Pap smear, abnormal     Past Surgical History:  Procedure Laterality Date  . COLONOSCOPY WITH PROPOFOL N/A 05/15/2023   Procedure: COLONOSCOPY WITH PROPOFOL;  Surgeon: Hilarie Fredrickson, MD;  Location: [facility];  Service: Gastroenterology;  Laterality: N/A;  . cyst removed  47 yr old   benign, abdominal  . HERNIA REPAIR      Family History  Problem Relation Age of Onset  . Fibromyalgia Mother   . Diabetes Mother        type 2  . Hypertension Mother   . Thyroid disease Mother   . Prostate cancer Father 31  . Diabetes Father        type 2  . Heart disease Father 59       2 MI  . Hypertension Father   . Heart attack Father 23       X 2  . Stroke Father        mini strokes  . GER disease Father   . Hyperlipidemia Father   . Cholelithiasis Father   . Venous thrombosis Father  in liver  . Colon polyps Father   . Sleep apnea Father   . Obesity Father   . Hypertension Brother   . Venous thrombosis Brother        on spine  . Stomach cancer Maternal Grandfather   . Hypertension Paternal Grandmother   . Diabetes Paternal Grandmother        type 2  . Heart attack Paternal Grandmother   . Heart disease Paternal Grandmother   . Heart attack Paternal Grandfather   . Heart disease Paternal Grandfather   . Hypertension Paternal Grandfather   . Prostate cancer Maternal Uncle     Social History   Socioeconomic History  . Marital status: Single    Spouse name: Not on file  . Number of children: 0  . Years of education: Not on file  . Highest education level: Bachelor's degree (e.g., BA, AB, BS)  Occupational History  . Occupation:  Data processing manager  . Occupation: Haematologist  Tobacco Use  . Smoking status: Never  . Smokeless tobacco: Never  Vaping Use  . Vaping status: Never Used  Substance and Sexual Activity  . Alcohol use: Not Currently    Comment: OCCASIONALLY  . Drug use: No  . Sexual activity: Yes    Partners: Male    Birth control/protection: None    Comment: No dietary restrictions, lives with Mom, Dad and brother and niece.  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 Not on file  Social History Narrative   Lives with sister   Works as a Merchandiser, retail Strain: Not on file  Food Insecurity: Not on file  Transportation Needs: Not on file  Physical Activity: Not on file  Stress: Not on file  Social Connections: Not on file  Intimate Partner Violence: Not on file    Outpatient Medications Prior to Visit  Medication Sig Dispense Refill  . acetaminophen (TYLENOL) 500 MG tablet Take 500 mg by mouth every 6 (six) hours as needed for moderate pain. 2-3 tabs po qd prn    . aspirin EC 81 MG tablet Take 81 mg by mouth daily. Swallow whole.    . cholecalciferol (VITAMIN D) 25 MCG (1000 UNIT) tablet Take 1,000 Units by mouth daily.    . [employer] Omega-3 Krill Oil 500 MG CAPS Take 500 mg by mouth daily.    . cyclobenzaprine (FLEXERIL) 10 MG tablet Take 1 tablet (10 mg total) by mouth 3 (three) times daily as needed for muscle spasms. 90 tablet 2  . fluticasone (FLONASE) 50 MCG/ACT nasal spray Place 1 spray into both nostrils daily as needed for allergies.    . furosemide (LASIX) 20 MG tablet Take 1-2 tablets (20-40 mg total) by mouth daily as needed for edema. *Need office visit* 60 tablet 0  . HYDROcodone-acetaminophen (NORCO/VICODIN) 5-325 MG tablet Take 1 tablet by mouth 3 (three) times daily as needed for moderate pain. 30 tablet 0  . losartan (COZAAR) 25 MG tablet Take 1 tablet (25 mg total) by mouth in the morning and at bedtime. 180 tablet 0  .  megestrol (MEGACE) 40 MG tablet Take 1 tablet (40 mg total) by mouth 2 (two) times daily. Can increase to two tablets twice a day in the event of heavy bleeding 60 tablet 8  . metoprolol succinate (TOPROL-XL) 25 MG 24 hr tablet Take 1 tablet (25 mg total) by mouth daily. 90 tablet 1  . Multiple Vitamins-Minerals (  WOMENS MULTI VITAMIN & MINERAL PO) Take 1 tablet by mouth daily.    Marland Kitchen omeprazole (PRILOSEC) 40 MG capsule TAKE ONE CAPSULE BY MOUTH ONCE A DAY 90 capsule 0   No facility-administered medications prior to visit.    Allergies  Allergen Reactions  . Skelaxin Hypertension    Review of Systems  Constitutional:  Positive for malaise/fatigue. Negative for fever.  HENT:  Negative for congestion.   Eyes:  Negative for blurred vision.  Respiratory:  Negative for shortness of breath.   Cardiovascular:  Positive for leg swelling. Negative for chest pain and palpitations.  Gastrointestinal:  Negative for abdominal pain, blood in stool and nausea.  Genitourinary:  Negative for dysuria and frequency.  Musculoskeletal:  Positive for back pain and joint pain. Negative for falls.  Skin:  Negative for rash.  Neurological:  Negative for dizziness, loss of consciousness and headaches.  Endo/Heme/Allergies:  Negative for environmental allergies.  Psychiatric/Behavioral:  Negative for depression. The patient has insomnia. The patient is not nervous/anxious.       Objective:    Physical Exam Constitutional:      General: She is not in acute distress.    Appearance: Normal appearance. She is well-developed. She is obese. She is not ill-appearing or toxic-appearing.  HENT:     Head: Normocephalic and atraumatic.     Right Ear: External ear normal.     Left Ear: External ear normal.     Nose: Nose normal.  Eyes:     General:        Right eye: No discharge.        Left eye: No discharge.     Conjunctiva/sclera: Conjunctivae normal.  Neck:     Thyroid: No thyromegaly.  Cardiovascular:      Rate and Rhythm: Normal rate and regular rhythm.     Heart sounds: Normal heart sounds. No murmur heard. Pulmonary:     Effort: Pulmonary effort is normal. No respiratory distress.     Breath sounds: Normal breath sounds.  Abdominal:     General: Bowel sounds are normal.     Palpations: Abdomen is soft.     Tenderness: There is no abdominal tenderness. There is no guarding.  Musculoskeletal:        General: Normal range of motion.     Cervical back: Neck supple.  Lymphadenopathy:     Cervical: No cervical adenopathy.  Skin:    General: Skin is warm and dry.  Neurological:     Mental Status: She is alert and oriented to person, place, and time.  Psychiatric:        Mood and Affect: Mood normal.        Behavior: Behavior normal.        Thought Content: Thought content normal.        Judgment: Judgment normal.   BP 130/74 (BP Location: Left Arm, Patient Position: Sitting, Cuff Size: Normal)   Pulse 73   Temp 98 F (36.7 C) (Oral)   Resp 16   Ht 5\' 7"  (1.702 m)   Wt (!) 395 lb 12.8 oz (179.5 kg)   SpO2 98%   BMI 61.99 kg/m  Wt Readings from Last 3 Encounters:  09/19/23 (!) 395 lb 12.8 oz (179.5 kg)  05/15/23 (!) 388 lb (176 kg)  04/27/23 (!) 388 lb 8 oz (176.2 kg)    Diabetic Foot Exam - Simple   No data filed    Lab Results  Component Value Date   WBC 5.6  09/19/2023   HGB 14.6 09/19/2023   HCT 43.5 09/19/2023   PLT 282.0 09/19/2023   GLUCOSE 101 (H) 09/19/2023   CHOL 170 09/19/2023   TRIG 74.0 09/19/2023   HDL 52.10 09/19/2023   LDLCALC 103 (H) 09/19/2023   ALT 22 09/19/2023   AST 16 09/19/2023   NA 141 09/19/2023   K 4.1 09/19/2023   CL 104 09/19/2023   CREATININE 0.92 09/19/2023   BUN 8 09/19/2023   CO2 27 09/19/2023   TSH 0.62 09/19/2023   HGBA1C 5.9 09/19/2023    Lab Results  Component Value Date   TSH 0.62 09/19/2023   Lab Results  Component Value Date   WBC 5.6 09/19/2023   HGB 14.6 09/19/2023   HCT 43.5 09/19/2023   MCV 91.7 09/19/2023    PLT 282.0 09/19/2023   Lab Results  Component Value Date   NA 141 09/19/2023   K 4.1 09/19/2023   CO2 27 09/19/2023   GLUCOSE 101 (H) 09/19/2023   BUN 8 09/19/2023   CREATININE 0.92 09/19/2023   BILITOT 0.6 09/19/2023   ALKPHOS 73 09/19/2023   AST 16 09/19/2023   ALT 22 09/19/2023   PROT 7.0 09/19/2023   ALBUMIN 4.3 09/19/2023   CALCIUM 9.6 09/19/2023   ANIONGAP 8 02/13/2021   EGFR 81 03/14/2023   GFR 73.52 09/19/2023   Lab Results  Component Value Date   CHOL 170 09/19/2023   Lab Results  Component Value Date   HDL 52.10 09/19/2023   Lab Results  Component Value Date   LDLCALC 103 (H) 09/19/2023   Lab Results  Component Value Date   TRIG 74.0 09/19/2023   Lab Results  Component Value Date   CHOLHDL 3 09/19/2023   Lab Results  Component Value Date   HGBA1C 5.9 09/19/2023       Assessment & Plan:  Gastroesophageal reflux disease, unspecified whether esophagitis present Assessment & Plan: Avoid offending foods, start probiotics. Do not eat large meals in late evening and consider raising head of bed.     Primary hypertension Assessment & Plan: Well controlled, no changes to meds. Encouraged heart healthy diet such as the DASH diet and exercise as tolerated.   Orders: -     Comprehensive metabolic panel -     CBC with Differential/Platelet -     TSH  Hyperlipidemia, mixed Assessment & Plan: Encourage heart healthy diet such as MIND or DASH diet, increase exercise, avoid trans fats, simple carbohydrates and processed foods, consider a krill or fish or flaxseed oil cap daily.   Orders: -     Lipid panel  Morbid obesity (HCC) Assessment & Plan: Encouraged DASH or MIND diet, decrease po intake and increase exercise as tolerated. Needs 7-8 hours of sleep nightly. Avoid trans fats, eat small, frequent meals every 4-5 hours with lean proteins, complex carbs and healthy fats. Minimize simple carbs, high fat foods and processed foods   Vitamin D  deficiency Assessment & Plan: Supplement and monitor  Orders: -     VITAMIN D 25 Hydroxy (Vit-D Deficiency, Fractures)  Hyperglycemia Assessment & Plan: hgba1c acceptable, minimize simple carbs. Increase exercise as tolerated.  Orders: -     Hemoglobin A1c  High risk medication use -     Drug Monitoring Panel [phone] , Urine  Need for influenza vaccination -     Flu vaccine trivalent PF, 6mos and older(Flulaval,Afluria,Fluarix,Fluzone)  Other orders -     Gabapentin; 1 cap po bid and then 3 caps po  qhs  Dispense: 150 capsule; Refill: 2    Assessment and Plan    Perimenopausal Symptoms Severe hot flashes and night sweats leading to sleep disturbances. Daytime hot flashes are tolerable. -Start Gabapentin 100mg  at night, titrate up to 100mg  in the morning, 100mg  midday, and 300mg  at bedtime over several days. -Consider adding an SSRI if symptoms persist.  Obesity Discussed the impact of weight on mobility, quality of life, and longevity. Discussed potential options including bariatric surgery, healthy weight and wellness programs, and weight loss medications. -Encouraged patient to consider options and follow up.  Lower Extremity Edema Discussed the use of compression socks to prevent swelling, especially during periods of prolonged standing or walking. -Provide information on a local provider for custom compression socks.  General Health Maintenance -Order routine blood work today. -Administer influenza vaccine today. -Schedule a follow-up virtual visit in 2-3 months and a physical in 6 months.         Danise Edge, MD

## 2023-09-21 LAB — DRUG MONITORING PANEL 376104, URINE
Amphetamines: NEGATIVE ng/mL (ref <500)
Barbiturates: NEGATIVE ng/mL (ref <300)
Benzodiazepines: NEGATIVE ng/mL (ref <100)
Cocaine Metabolite: NEGATIVE ng/mL (ref <150)
Codeine: NEGATIVE ng/mL (ref <50)
Desmethyltramadol: NEGATIVE ng/mL (ref <100)
Hydrocodone: 156 ng/mL — ABNORMAL HIGH (ref <50)
Hydromorphone: NEGATIVE ng/mL (ref <50)
Morphine: NEGATIVE ng/mL (ref <50)
Norhydrocodone: 100 ng/mL — ABNORMAL HIGH (ref <50)
Opiates: POSITIVE ng/mL — AB (ref <100)
Oxycodone: NEGATIVE ng/mL (ref <100)
Tramadol: NEGATIVE ng/mL (ref <100)

## 2023-09-21 LAB — DM TEMPLATE

## 2023-09-26 ENCOUNTER — Other Ambulatory Visit: Payer: Self-pay | Admitting: Family Medicine

## 2023-09-26 ENCOUNTER — Encounter: Payer: Self-pay | Admitting: Obstetrics & Gynecology

## 2023-09-28 ENCOUNTER — Other Ambulatory Visit: Payer: Self-pay | Admitting: Family

## 2023-09-28 DIAGNOSIS — M545 Low back pain, unspecified: Secondary | ICD-10-CM

## 2023-10-22 ENCOUNTER — Other Ambulatory Visit: Payer: Self-pay | Admitting: Family Medicine

## 2023-11-09 ENCOUNTER — Other Ambulatory Visit: Payer: Self-pay | Admitting: Family Medicine

## 2023-11-15 ENCOUNTER — Other Ambulatory Visit: Payer: Self-pay | Admitting: Emergency Medicine

## 2023-11-15 ENCOUNTER — Encounter: Payer: Self-pay | Admitting: Family Medicine

## 2023-11-15 DIAGNOSIS — M545 Low back pain, unspecified: Secondary | ICD-10-CM

## 2023-11-15 MED ORDER — CYCLOBENZAPRINE HCL 10 MG PO TABS: 10.0000 mg | ORAL_TABLET | Freq: Three times a day (TID) | ORAL | 2 refills | Status: DC | PRN

## 2023-11-15 NOTE — Telephone Encounter (Signed)
Requesting: Hydrocodone 5-325 mg Tablet Contract: 09/19/2023 UDS: 09/19/2023 Last Visit: 09/19/2023 Next Visit:03/18/2024 Last Refill: 09/13/2023  Please Advise   She would like this refilled at [facility].  [phone] (Phone) [phone] (Fax)

## 2023-12-31 ENCOUNTER — Encounter: Payer: Self-pay | Admitting: Obstetrics & Gynecology

## 2024-01-01 ENCOUNTER — Other Ambulatory Visit: Payer: Self-pay

## 2024-01-01 DIAGNOSIS — N939 Abnormal uterine and vaginal bleeding, unspecified: Secondary | ICD-10-CM

## 2024-01-01 MED ORDER — MEGESTROL ACETATE 40 MG PO TABS: 40.0000 mg | ORAL_TABLET | Freq: Two times a day (BID) | ORAL | 0 refills | Status: DC

## 2024-01-01 NOTE — Progress Notes (Signed)
Refilled once and scheduled patient for annual exam. See patient messages. [name] RN

## 2024-01-10 ENCOUNTER — Ambulatory Visit: Payer: BC Managed Care – PPO | Admitting: Obstetrics & Gynecology

## 2024-01-17 ENCOUNTER — Ambulatory Visit (INDEPENDENT_AMBULATORY_CARE_PROVIDER_SITE_OTHER): Payer: BC Managed Care – PPO | Admitting: Obstetrics & Gynecology

## 2024-01-17 ENCOUNTER — Encounter: Payer: Self-pay | Admitting: Family Medicine

## 2024-01-17 ENCOUNTER — Other Ambulatory Visit (HOSPITAL_COMMUNITY)
Admission: RE | Admit: 2024-01-17 | Discharge: 2024-01-17 | Disposition: A | Discharge status: Home / Self Care | Payer: BC Managed Care – PPO | Source: Ambulatory Visit | Attending: Obstetrics & Gynecology | Admitting: Obstetrics & Gynecology

## 2024-01-17 VITALS — BP 167/97 | HR 99 | Ht 68.0 in | Wt >= 6400 oz

## 2024-01-17 DIAGNOSIS — Z01419 Encounter for gynecological examination (general) (routine) without abnormal findings: Secondary | ICD-10-CM | POA: Diagnosis not present

## 2024-01-17 DIAGNOSIS — N939 Abnormal uterine and vaginal bleeding, unspecified: Secondary | ICD-10-CM

## 2024-01-17 DIAGNOSIS — Z6841 Body Mass Index (BMI) 40.0 and over, adult: Secondary | ICD-10-CM

## 2024-01-17 DIAGNOSIS — I10 Essential (primary) hypertension: Secondary | ICD-10-CM

## 2024-01-17 MED ORDER — MEGESTROL ACETATE 40 MG PO TABS: 40.0000 mg | ORAL_TABLET | Freq: Every day | ORAL | 4 refills | Status: AC

## 2024-01-17 NOTE — Addendum Note (Signed)
Addended by: Billey Chang A on: 01/17/2024 04:31 PM   Modules accepted: Orders

## 2024-01-17 NOTE — Progress Notes (Signed)
 Subjective:     Christina Mendez is a 48 y.o. female here for a routine exam.  Current complaints: Pt reports that she remains on Megace  2 per day. Occ spotting but, o/w no bleeding.  Has had issues to arthritis. No GYN problems. Has colonoscopy earlier this year. Repeat needed in 10 years. .     Gynecologic History No LMP recorded. (Menstrual status: Other). Contraception: condoms Last Pap: 08/27/2020. Results were: normal Last mammogram: 12/15/2021. Results were: normal  Obstetric History OB History  Gravida Para Term Preterm AB Living  0 0 0 0 0 0  SAB IAB Ectopic Multiple Live Births  0 0 0 0 0     The following portions of the patient's history were reviewed and updated as appropriate: allergies, current medications, past family history, past medical history, past social history, past surgical history, and problem list.  Review of Systems Pertinent items are noted in HPI.    Objective:  BP (!) 167/97 (BP Location: Right Arm, Patient Position: Sitting, Cuff Size: Large)   Pulse 99   Ht 5' 8 (1.727 m)   Wt (!) 412 lb (186.9 kg)   BMI 62.64 kg/m   General Appearance:    Alert, cooperative, no distress, appears stated age  Head:    Normocephalic, without obvious abnormality, atraumatic  Eyes:    conjunctiva/corneas clear, EOM's intact, both eyes  Ears:    Normal external ear canals, both ears  Nose:   Nares normal, septum midline, mucosa normal, no drainage    or sinus tenderness  Throat:   Lips, mucosa, and tongue normal; teeth and gums normal  Neck:   Supple, symmetrical, trachea midline, no adenopathy;    thyroid :  no enlargement/tenderness/nodules  Back:     Symmetric, no curvature, ROM normal, no CVA tenderness  Lungs:     respirations unlabored  Chest Wall:    No tenderness or deformity   Heart:    Regular rate and rhythm  Breast Exam:    No tenderness, masses, or nipple abnormality  Abdomen:     Soft, non-tender, bowel sounds active all four quadrants,    no  masses, no organomegaly  Genitalia:    Normal female without lesion, discharge or tenderness     Extremities:   Extremities normal, atraumatic, no cyanosis or edema  Pulses:   2+ and symmetric all extremities  Skin:   Skin color, texture, turgor normal, no rashes or lesions     Assessment:    Healthy female exam.  Elevated BP- will f/u with primary care Dr Carleton BMI >60- reviewed with pt meds vs surgery.   She will discuss further with Dr. Carleton.   Plan:  Christina Mendez was seen today for gynecologic exam.  Diagnoses and all orders for this visit:  Well female exam with routine gynecological exam  Abnormal uterine bleeding (AUB) -     megestrol  (MEGACE ) 40 MG tablet; Take 1 tablet (40 mg total) by mouth daily. Can increase to two tablets twice a day in the event of heavy bleeding  BMI 60.0-69.9, adult (HCC)  Primary hypertension   Pt to f/u with Dr. Carleton. Note sent to Dr. Carleton re HTN and obesity.   Christina Mendez L. Harraway-Smith, M.D., FACOG

## 2024-01-19 ENCOUNTER — Telehealth: Payer: Self-pay | Admitting: *Deleted

## 2024-01-19 NOTE — Telephone Encounter (Signed)
 Left message on machine to call back

## 2024-01-19 NOTE — Telephone Encounter (Signed)
-----   Message from Harlene Horton sent at 01/17/2024  9:21 PM EST ----- Please check with patient and see if she can check her bp at home weekly and then send us  some numbers. Or she can come in for a nurse visit in 2 weeks. ----- Message ----- From: Corene Coy, MD Sent: 01/17/2024   3:44 PM EST To: Harlene DELENA Horton, MD  Greetings!  Pt has elevated BP today. She will f/u with you to discuss.   We also discussed obesity management. She is deciding between surgery vs meds.   Take care!  Clh-S

## 2024-01-19 NOTE — Telephone Encounter (Signed)
 Pt has been notified on next steps.

## 2024-01-19 NOTE — Telephone Encounter (Signed)
 Copied from CRM [id number]. Topic: General - Other >> Jan 19, 2024  4:09 PM Chantha C wrote: Reason for CRM: Patient states is returning the office call regarding her OBGYN to Dr. Domenica. Patient could not wait any longer, had to get back to work. Please call back at [phone].

## 2024-01-19 NOTE — Telephone Encounter (Signed)
 Patient will get take bp at home and send them in via [place].

## 2024-01-21 ENCOUNTER — Other Ambulatory Visit: Payer: Self-pay | Admitting: Family Medicine

## 2024-01-24 ENCOUNTER — Encounter: Payer: Self-pay | Admitting: Family Medicine

## 2024-01-24 LAB — CYTOLOGY - PAP
Chlamydia: NEGATIVE
Comment: NEGATIVE
Comment: NEGATIVE
Comment: NORMAL
Diagnosis: NEGATIVE
High risk HPV: NEGATIVE
Neisseria Gonorrhea: NEGATIVE

## 2024-01-26 ENCOUNTER — Other Ambulatory Visit: Payer: Self-pay | Admitting: Emergency Medicine

## 2024-01-26 DIAGNOSIS — I10 Essential (primary) hypertension: Secondary | ICD-10-CM

## 2024-01-26 MED ORDER — LOSARTAN POTASSIUM 50 MG PO TABS: 50.0000 mg | ORAL_TABLET | Freq: Every day | ORAL | 3 refills | Status: DC

## 2024-01-26 NOTE — Telephone Encounter (Signed)
Called and spoke with patient. Instructions were given to patient per provider's order. Patient is going to call and set up her lab appointment.

## 2024-01-29 NOTE — Telephone Encounter (Signed)
 Called and spoke with patient and she would like to do the [facility]

## 2024-01-30 ENCOUNTER — Other Ambulatory Visit: Payer: Self-pay | Admitting: Family Medicine

## 2024-01-30 MED ORDER — WEGOVY 0.25 MG/0.5ML ~~LOC~~ SOAJ: 0.2500 mg | SUBCUTANEOUS | 1 refills | Status: DC

## 2024-02-02 ENCOUNTER — Encounter: Payer: Self-pay | Admitting: Obstetrics & Gynecology

## 2024-02-05 ENCOUNTER — Encounter: Payer: Self-pay | Admitting: Family Medicine

## 2024-02-07 ENCOUNTER — Telehealth: Payer: Self-pay

## 2024-02-07 ENCOUNTER — Other Ambulatory Visit (HOSPITAL_COMMUNITY): Payer: Self-pay

## 2024-02-07 ENCOUNTER — Other Ambulatory Visit: Payer: Self-pay | Admitting: Family Medicine

## 2024-02-07 DIAGNOSIS — M545 Low back pain, unspecified: Secondary | ICD-10-CM

## 2024-02-07 NOTE — Telephone Encounter (Signed)
 Pharmacy Patient Advocate Encounter   Received notification from Patient Advice Request messages that prior authorization for Wegovy 0.25mg /0.40ml is required/requested.   Insurance verification completed.   The patient is insured through [facility] .   Per test claim: PA required; PA submitted to above mentioned insurance via CoverMyMeds Key/confirmation #/EOC [id number] Status is pending

## 2024-02-08 NOTE — Telephone Encounter (Signed)
 Pharmacy Patient Advocate Encounter  Received notification from [facility] that Prior Authorization for Wegovy 0.25mg /0.31ml has been APPROVED from 02/07/24 to 06/12/24

## 2024-02-13 ENCOUNTER — Other Ambulatory Visit (INDEPENDENT_AMBULATORY_CARE_PROVIDER_SITE_OTHER): Payer: BC Managed Care – PPO

## 2024-02-13 DIAGNOSIS — I10 Essential (primary) hypertension: Secondary | ICD-10-CM | POA: Diagnosis not present

## 2024-02-13 LAB — COMPREHENSIVE METABOLIC PANEL
ALT: 23 U/L (ref 0–35)
AST: 16 U/L (ref 0–37)
Albumin: 4 g/dL (ref 3.5–5.2)
Alkaline Phosphatase: 76 U/L (ref 39–117)
BUN: 9 mg/dL (ref 6–23)
CO2: 28 meq/L (ref 19–32)
Calcium: 9.3 mg/dL (ref 8.4–10.5)
Chloride: 104 meq/L (ref 96–112)
Creatinine, Ser: 0.86 mg/dL (ref 0.40–1.20)
GFR: 79.5 mL/min (ref >60.00)
Glucose, Bld: 115 mg/dL — ABNORMAL HIGH (ref 70–99)
Potassium: 4 meq/L (ref 3.5–5.1)
Sodium: 140 meq/L (ref 135–145)
Total Bilirubin: 0.8 mg/dL (ref 0.2–1.2)
Total Protein: 6.8 g/dL (ref 6.0–8.3)

## 2024-02-14 ENCOUNTER — Encounter: Payer: Self-pay | Admitting: Family Medicine

## 2024-03-13 ENCOUNTER — Other Ambulatory Visit: Payer: Self-pay | Admitting: Family Medicine

## 2024-03-17 NOTE — Assessment & Plan Note (Signed)
 Encouraged DASH or MIND diet, decrease po intake and increase exercise as tolerated. Needs 7-8 hours of sleep nightly. Avoid trans fats, eat small, frequent meals every 4-5 hours with lean proteins, complex carbs and healthy fats. Minimize simple carbs, high fat foods and processed foods

## 2024-03-17 NOTE — Assessment & Plan Note (Signed)
 Supplement and monitor

## 2024-03-17 NOTE — Assessment & Plan Note (Signed)
 Well controlled, no changes to meds. Encouraged heart healthy diet such as the DASH diet and exercise as tolerated.

## 2024-03-17 NOTE — Assessment & Plan Note (Signed)
 hgba1c acceptable, minimize simple carbs. Increase exercise as tolerated.

## 2024-03-17 NOTE — Assessment & Plan Note (Signed)
 Encourage heart healthy diet such as MIND or DASH diet, increase exercise, avoid trans fats, simple carbohydrates and processed foods, consider a krill or fish or flaxseed oil cap daily.

## 2024-03-18 ENCOUNTER — Ambulatory Visit (INDEPENDENT_AMBULATORY_CARE_PROVIDER_SITE_OTHER): Payer: BC Managed Care – PPO | Admitting: Family Medicine

## 2024-03-18 ENCOUNTER — Encounter: Payer: Self-pay | Admitting: Family Medicine

## 2024-03-18 VITALS — BP 141/75 | HR 84 | Temp 98.1°F | Resp 18 | Ht 67.0 in | Wt >= 6400 oz

## 2024-03-18 DIAGNOSIS — R739 Hyperglycemia, unspecified: Secondary | ICD-10-CM

## 2024-03-18 DIAGNOSIS — I10 Essential (primary) hypertension: Secondary | ICD-10-CM

## 2024-03-18 DIAGNOSIS — G4733 Obstructive sleep apnea (adult) (pediatric): Secondary | ICD-10-CM

## 2024-03-18 DIAGNOSIS — E559 Vitamin D deficiency, unspecified: Secondary | ICD-10-CM

## 2024-03-18 DIAGNOSIS — E782 Mixed hyperlipidemia: Secondary | ICD-10-CM

## 2024-03-18 LAB — COMPREHENSIVE METABOLIC PANEL WITH GFR
ALT: 26 U/L (ref 0–35)
AST: 19 U/L (ref 0–37)
Albumin: 4.3 g/dL (ref 3.5–5.2)
Alkaline Phosphatase: 74 U/L (ref 39–117)
BUN: 7 mg/dL (ref 6–23)
CO2: 27 meq/L (ref 19–32)
Calcium: 9.3 mg/dL (ref 8.4–10.5)
Chloride: 104 meq/L (ref 96–112)
Creatinine, Ser: 0.84 mg/dL (ref 0.40–1.20)
GFR: 81.72 mL/min (ref >60.00)
Glucose, Bld: 109 mg/dL — ABNORMAL HIGH (ref 70–99)
Potassium: 4.3 meq/L (ref 3.5–5.1)
Sodium: 141 meq/L (ref 135–145)
Total Bilirubin: 0.7 mg/dL (ref 0.2–1.2)
Total Protein: 7 g/dL (ref 6.0–8.3)

## 2024-03-18 LAB — LIPID PANEL
Cholesterol: 155 mg/dL (ref 0–200)
HDL: 47.7 mg/dL (ref >39.00)
LDL Cholesterol: 88 mg/dL (ref 0–99)
NonHDL: 107.04
Total CHOL/HDL Ratio: 3
Triglycerides: 93 mg/dL (ref 0.0–149.0)
VLDL: 18.6 mg/dL (ref 0.0–40.0)

## 2024-03-18 LAB — CBC WITH DIFFERENTIAL/PLATELET
Basophils Absolute: 0 10*3/uL (ref 0.0–0.1)
Basophils Relative: 0.8 % (ref 0.0–3.0)
Eosinophils Absolute: 0.1 10*3/uL (ref 0.0–0.7)
Eosinophils Relative: 1.9 % (ref 0.0–5.0)
HCT: 42.6 % (ref 36.0–46.0)
Hemoglobin: 14.5 g/dL (ref 12.0–15.0)
Lymphocytes Relative: 30.9 % (ref 12.0–46.0)
Lymphs Abs: 1.7 10*3/uL (ref 0.7–4.0)
MCHC: 34 g/dL (ref 30.0–36.0)
MCV: 91.1 fl (ref 78.0–100.0)
Monocytes Absolute: 0.4 10*3/uL (ref 0.1–1.0)
Monocytes Relative: 6.7 % (ref 3.0–12.0)
Neutro Abs: 3.4 10*3/uL (ref 1.4–7.7)
Neutrophils Relative %: 59.7 % (ref 43.0–77.0)
Platelets: 282 10*3/uL (ref 150.0–400.0)
RBC: 4.68 Mil/uL (ref 3.87–5.11)
RDW: 13.8 % (ref 11.5–15.5)
WBC: 5.6 10*3/uL (ref 4.0–10.5)

## 2024-03-18 LAB — HEMOGLOBIN A1C: Hgb A1c MFr Bld: 6 % (ref 4.6–6.5)

## 2024-03-18 LAB — VITAMIN D 25 HYDROXY (VIT D DEFICIENCY, FRACTURES): VITD: 52.42 ng/mL (ref 30.00–100.00)

## 2024-03-18 LAB — TSH: TSH: 0.69 u[IU]/mL (ref 0.35–5.50)

## 2024-03-18 MED ORDER — METOPROLOL SUCCINATE ER 50 MG PO TB24: 50.0000 mg | ORAL_TABLET | Freq: Every day | ORAL | 1 refills | Status: DC

## 2024-03-18 NOTE — Patient Instructions (Signed)
 Hypertension, Adult High blood pressure (hypertension) is when the force of blood pumping through the arteries is too strong. The arteries are the blood vessels that carry blood from the heart throughout the body. Hypertension forces the heart to work harder to pump blood and may cause arteries to become narrow or stiff. Untreated or uncontrolled hypertension can lead to a heart attack, heart failure, a stroke, kidney disease, and other problems. A blood pressure reading consists of a higher number over a lower number. Ideally, your blood pressure should be below 120/80. The first ("top") number is called the systolic pressure. It is a measure of the pressure in your arteries as your heart beats. The second ("bottom") number is called the diastolic pressure. It is a measure of the pressure in your arteries as the heart relaxes. What are the causes? The exact cause of this condition is not known. There are some conditions that result in high blood pressure. What increases the risk? Certain factors may make you more likely to develop high blood pressure. Some of these risk factors are under your control, including: Smoking. Not getting enough exercise or physical activity. Being overweight. Having too much fat, sugar, calories, or salt (sodium) in your diet. Drinking too much alcohol. Other risk factors include: Having a personal history of heart disease, diabetes, high cholesterol, or kidney disease. Stress. Having a family history of high blood pressure and high cholesterol. Having obstructive sleep apnea. Age. The risk increases with age. What are the signs or symptoms? High blood pressure may not cause symptoms. Very high blood pressure (hypertensive crisis) may cause: Headache. Fast or irregular heartbeats (palpitations). Shortness of breath. Nosebleed. Nausea and vomiting. Vision changes. Severe chest pain, dizziness, and seizures. How is this diagnosed? This condition is diagnosed by  measuring your blood pressure while you are seated, with your arm resting on a flat surface, your legs uncrossed, and your feet flat on the floor. The cuff of the blood pressure monitor will be placed directly against the skin of your upper arm at the level of your heart. Blood pressure should be measured at least twice using the same arm. Certain conditions can cause a difference in blood pressure between your right and left arms. If you have a high blood pressure reading during one visit or you have normal blood pressure with other risk factors, you may be asked to: Return on a different day to have your blood pressure checked again. Monitor your blood pressure at home for 1 week or longer. If you are diagnosed with hypertension, you may have other blood or imaging tests to help your health care provider understand your overall risk for other conditions. How is this treated? This condition is treated by making healthy lifestyle changes, such as eating healthy foods, exercising more, and reducing your alcohol intake. You may be referred for counseling on a healthy diet and physical activity. Your health care provider may prescribe medicine if lifestyle changes are not enough to get your blood pressure under control and if: Your systolic blood pressure is above 130. Your diastolic blood pressure is above 80. Your personal target blood pressure may vary depending on your medical conditions, your age, and other factors. Follow these instructions at home: Eating and drinking  Eat a diet that is high in fiber and potassium, and low in sodium, added sugar, and fat. An example of this eating plan is called the DASH diet. DASH stands for Dietary Approaches to Stop Hypertension. To eat this way: Eat  plenty of fresh fruits and vegetables. Try to fill one half of your plate at each meal with fruits and vegetables. Eat whole grains, such as whole-wheat pasta, brown rice, or whole-grain bread. Fill about one  fourth of your plate with whole grains. Eat or drink low-fat dairy products, such as skim milk or low-fat yogurt. Avoid fatty cuts of meat, processed or cured meats, and poultry with skin. Fill about one fourth of your plate with lean proteins, such as fish, chicken without skin, beans, eggs, or tofu. Avoid pre-made and processed foods. These tend to be higher in sodium, added sugar, and fat. Reduce your daily sodium intake. Many people with hypertension should eat less than 1,500 mg of sodium a day. Do not drink alcohol if: Your health care provider tells you not to drink. You are pregnant, may be pregnant, or are planning to become pregnant. If you drink alcohol: Limit how much you have to: 0-1 drink a day for women. 0-2 drinks a day for men. Know how much alcohol is in your drink. In the [place] one drink equals one 12 oz bottle of beer (355 mL), one 5 oz glass of wine (148 mL), or one 1 oz glass of hard liquor (44 mL). Lifestyle  Work with your health care provider to maintain a healthy body weight or to lose weight. Ask what an ideal weight is for you. Get at least 30 minutes of exercise that causes your heart to beat faster (aerobic exercise) most days of the week. Activities may include walking, swimming, or biking. Include exercise to strengthen your muscles (resistance exercise), such as Pilates or lifting weights, as part of your weekly exercise routine. Try to do these types of exercises for 30 minutes at least 3 days a week. Do not use any products that contain nicotine or tobacco. These products include cigarettes, chewing tobacco, and vaping devices, such as e-cigarettes. If you need help quitting, ask your health care provider. Monitor your blood pressure at home as told by your health care provider. Keep all follow-up visits. This is important. Medicines Take over-the-counter and prescription medicines only as told by your health care provider. Follow directions carefully. Blood  pressure medicines must be taken as prescribed. Do not skip doses of blood pressure medicine. Doing this puts you at risk for problems and can make the medicine less effective. Ask your health care provider about side effects or reactions to medicines that you should watch for. Contact a health care provider if you: Think you are having a reaction to a medicine you are taking. Have headaches that keep coming back (recurring). Feel dizzy. Have swelling in your ankles. Have trouble with your vision. Get help right away if you: Develop a severe headache or confusion. Have unusual weakness or numbness. Feel faint. Have severe pain in your chest or abdomen. Vomit repeatedly. Have trouble breathing. These symptoms may be an emergency. Get help right away. Call 911. Do not wait to see if the symptoms will go away. Do not drive yourself to the hospital. Summary Hypertension is when the force of blood pumping through your arteries is too strong. If this condition is not controlled, it may put you at risk for serious complications. Your personal target blood pressure may vary depending on your medical conditions, your age, and other factors. For most people, a normal blood pressure is less than 120/80. Hypertension is treated with lifestyle changes, medicines, or a combination of both. Lifestyle changes include losing weight, eating a healthy,  low-sodium diet, exercising more, and limiting alcohol. This information is not intended to replace advice given to you by your health care provider. Make sure you discuss any questions you have with your health care provider. Document Revised: 10/05/2021 Document Reviewed: 10/05/2021 Elsevier Patient Education  2024 [employer].

## 2024-03-18 NOTE — Assessment & Plan Note (Signed)
 Has not had a sleep study in over 10 years. Uses her CPAP nightly will refer back to pulmonology for repeat study. She is not sleeping well and wakes up multiple times a night, has trouble falling back to sleep

## 2024-03-18 NOTE — Progress Notes (Signed)
 Subjective:    Patient ID: Christina Mendez, female    DOB: 1976-03-22, 48 y.o.   MRN: [id number]  Chief Complaint  Patient presents with   Follow-up    HPI Discussed the use of AI scribe software for clinical note transcription with the patient, who gave verbal consent to proceed.  History of Present Illness Christina Mendez is a 48 year old female with hypertension and sleep apnea who presents with elevated blood pressure and sleep disturbances.  She has consistently elevated blood pressure at home, with readings reaching 140 mmHg. Her current medication regimen includes 50 mg of losartan and 25 mg of metoprolol daily. No headaches or chest pain. She is concerned about maintaining these elevated levels.  She experiences difficulty sleeping despite using cyclobenzaprine, which she takes approximately 30 minutes before bed. This medication helps her sleep for about three to four hours, but she then has difficulty returning to sleep after waking. She has a history of sleep apnea and uses a CPAP machine nightly. Her last sleep study was over ten years ago, and she has not had a repeat study since her initial diagnosis. She reports waking multiple times a night and having trouble falling back asleep.  In her social history, she mentions the stress of caring for her elderly parents, which has impacted her ability to maintain a healthy lifestyle, including regular exercise and proper nutrition. She works two jobs, including a part-time job every other weekend, which contributes to her stress and limits her time for self-care. Financial constraints impact her ability to afford additional health services. She has explored options for exercise, such as chair exercises and aquatic center memberships, but finds them financially and logistically challenging.    Past Medical History:  Diagnosis Date   Acid reflux    Allergic state 03/02/2012   Allergy last couple of years   seasonal    Anxiety    Back pain    Cervical cancer screening 03/02/2012   Chest pain    Chicken pox 3rd grade   GERD (gastroesophageal reflux disease)    GERD (gastroesophageal reflux disease)    Heartburn    HTN (hypertension) 12/09/2011   Hyperlipidemia    Hypertension    Inflamed skin tag 03/02/2012   Menorrhagia 05/04/2015   OAB (overactive bladder)    Obese 12/09/2011   Obesity    OSA (obstructive sleep apnea)    cpap   Ovarian cyst 08/17/2012   Palpitations    Pedal edema 07/11/2017   Sinusitis 01/08/2012   Swollen ankles    Swollen feet    Vaginal Pap smear, abnormal     Past Surgical History:  Procedure Laterality Date   COLONOSCOPY WITH PROPOFOL N/A 05/15/2023   Procedure: COLONOSCOPY WITH PROPOFOL;  Surgeon: Hilarie Fredrickson, MD;  Location: [facility];  Service: Gastroenterology;  Laterality: N/A;   cyst removed  48 yr old   benign, abdominal   HERNIA REPAIR      Family History  Problem Relation Age of Onset   Fibromyalgia Mother    Diabetes Mother        type 2   Hypertension Mother    Thyroid disease Mother    Prostate cancer Father 66   Diabetes Father        type 2   Heart disease Father 1       2 MI   Hypertension Father    Heart attack Father 3       X 2  Stroke Father        mini strokes   GER disease Father    Hyperlipidemia Father    Cholelithiasis Father    Venous thrombosis Father        in liver   Colon polyps Father    Sleep apnea Father    Obesity Father    Hypertension Brother    Venous thrombosis Brother        on spine   Stomach cancer Maternal Grandfather    Hypertension Paternal Grandmother    Diabetes Paternal Grandmother        type 2   Heart attack Paternal Grandmother    Heart disease Paternal Grandmother    Heart attack Paternal Grandfather    Heart disease Paternal Grandfather    Hypertension Paternal Grandfather    Prostate cancer Maternal Uncle     Social History   Socioeconomic History   Marital status: Single     Spouse name: Not on file   Number of children: 0   Years of education: Not on file   Highest education level: Bachelor's degree (e.g., BA, AB, BS)  Occupational History   Occupation: Data processing manager   Occupation: Haematologist  Tobacco Use   Smoking status: Never   Smokeless tobacco: Never  Vaping Use   Vaping status: Never Used  Substance and Sexual Activity   Alcohol use: Not Currently    Comment: OCCASIONALLY   Drug use: No   Sexual activity: Yes    Partners: Male    Birth control/protection: None    Comment: No dietary restrictions, lives with Mom, Dad and brother and niece.  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 2-3 tabs po qd prn (Patient not taking: Reported on 03/18/2024)     aspirin EC 81 MG tablet Take 81 mg by mouth daily. Swallow whole.     cholecalciferol (VITAMIN D) 25 MCG (1000 UNIT) tablet Take 1,000 Units by mouth daily.     [facility] Omega-3 Krill Oil 500 MG CAPS Take 500 mg by mouth daily.     cyclobenzaprine (FLEXERIL) 10 MG tablet TAKE ONE TABLET BY MOUTH THREE TIMES DAILY AS NEEDED FOR MUSCLE SPASM 90 tablet 0   fluticasone (FLONASE) 50 MCG/ACT nasal spray Place 1 spray into both nostrils daily as needed for allergies.     furosemide (LASIX) 20 MG tablet Take 1-2 tablets (20-40 mg total) by mouth daily as needed for edema. *Need office visit* 60 tablet 0   gabapentin (NEURONTIN) 100 MG capsule 1 cap po bid and then 3 caps po  qhs (Patient not taking: Reported on 01/17/2024) 150 capsule 2   HYDROcodone-acetaminophen (NORCO/VICODIN) 5-325 MG tablet Take 1 tablet by mouth 3 (three) times daily as needed for moderate pain. (Patient not taking: Reported on 01/17/2024) 30 tablet 0   losartan (COZAAR) 50 MG tablet Take 1 tablet (50 mg total)  by mouth daily. 30 tablet 3   megestrol (MEGACE) 40 MG tablet Take 1 tablet (40 mg total) by mouth daily. Can increase to two tablets twice a day in the event of heavy bleeding 90 tablet 4   Multiple Vitamins-Minerals (WOMENS MULTI VITAMIN & MINERAL PO) Take 1 tablet by mouth daily.     omeprazole (PRILOSEC) 40 MG capsule TAKE ONE CAPSULE BY MOUTH ONCE A DAY 90 capsule 0   Semaglutide-Weight Management (WEGOVY) 0.25 MG/0.5ML SOAJ Inject 0.25 mg into the skin once a week. (Patient not taking: Reported on 03/18/2024) 2 mL 1   metoprolol succinate (TOPROL-XL) 25 MG 24 hr tablet TAKE ONE TABLET BY MOUTH ONCE A DAY 90 tablet 1   No facility-administered medications prior to visit.    Allergies  Allergen Reactions   Skelaxin Hypertension    Review of Systems  Constitutional:  Positive for malaise/fatigue. Negative for fever.  HENT:  Negative for congestion.   Eyes:  Negative for blurred vision.  Respiratory:  Negative for shortness of breath.   Cardiovascular:  Negative for chest pain, palpitations and leg swelling.  Gastrointestinal:  Negative for abdominal pain, blood in stool and nausea.  Genitourinary:  Negative for dysuria and frequency.  Musculoskeletal:  Negative for falls.  Skin:  Negative for rash.  Neurological:  Negative for dizziness, loss of consciousness and headaches.  Endo/Heme/Allergies:  Negative for environmental allergies.  Psychiatric/Behavioral:  Negative for depression. The patient is nervous/anxious and has insomnia.        Objective:    Physical Exam Constitutional:      General: She is not in acute distress.    Appearance: Normal appearance. She is  well-developed. She is obese. She is not toxic-appearing.  HENT:     Head: Normocephalic and atraumatic.     Right Ear: External ear normal.     Left Ear: External ear normal.     Nose: Nose normal.  Eyes:     General:        Right eye: No discharge.        Left eye: No discharge.     Conjunctiva/sclera: Conjunctivae normal.  Neck:     Thyroid: No thyromegaly.  Cardiovascular:     Rate and Rhythm: Normal rate and regular rhythm.     Heart sounds: Normal heart sounds. No murmur heard. Pulmonary:     Effort: Pulmonary effort is normal. No respiratory distress.     Breath sounds: Normal breath sounds.  Abdominal:     General: Bowel sounds are normal.     Palpations: Abdomen is soft.     Tenderness: There is no abdominal tenderness. There is no guarding.  Musculoskeletal:        General: Normal range of motion.     Cervical back: Neck supple.  Lymphadenopathy:     Cervical: No cervical adenopathy.  Skin:    General: Skin is warm and dry.  Neurological:     Mental Status: She is alert and oriented to person, place, and time.  Psychiatric:        Mood and Affect: Mood normal.        Behavior: Behavior normal.        Thought Content: Thought content normal.        Judgment: Judgment normal.     BP (!) 141/75 (BP Location: Right Arm, Patient Position: Sitting, Cuff Size: Large)   Pulse 84   Temp 98.1 F (36.7 C) (Oral)   Resp 18   Ht  5\' 7"  (1.702 m)   Wt (!) 414 lb 12.8 oz (188.2 kg)   SpO2 100%   BMI 64.97 kg/m  Wt Readings from Last 3 Encounters:  03/18/24 (!) 414 lb 12.8 oz (188.2 kg)  01/17/24 (!) 412 lb (186.9 kg)  09/19/23 (!) 395 lb 12.8 oz (179.5 kg)    Diabetic Foot Exam - Simple   No data filed    Lab Results  Component Value Date   WBC 5.6 09/19/2023   HGB 14.6 09/19/2023   HCT 43.5 09/19/2023   PLT 282.0 09/19/2023   GLUCOSE 115 (H) 02/13/2024   CHOL 170 09/19/2023   TRIG 74.0 09/19/2023   HDL 52.10 09/19/2023   LDLCALC 103 (H) 09/19/2023    ALT 23 02/13/2024   AST 16 02/13/2024   NA 140 02/13/2024   K 4.0 02/13/2024   CL 104 02/13/2024   CREATININE 0.86 02/13/2024   BUN 9 02/13/2024   CO2 28 02/13/2024   TSH 0.62 09/19/2023   HGBA1C 5.9 09/19/2023    Lab Results  Component Value Date   TSH 0.62 09/19/2023   Lab Results  Component Value Date   WBC 5.6 09/19/2023   HGB 14.6 09/19/2023   HCT 43.5 09/19/2023   MCV 91.7 09/19/2023   PLT 282.0 09/19/2023   Lab Results  Component Value Date   NA 140 02/13/2024   K 4.0 02/13/2024   CO2 28 02/13/2024   GLUCOSE 115 (H) 02/13/2024   BUN 9 02/13/2024   CREATININE 0.86 02/13/2024   BILITOT 0.8 02/13/2024   ALKPHOS 76 02/13/2024   AST 16 02/13/2024   ALT 23 02/13/2024   PROT 6.8 02/13/2024   ALBUMIN 4.0 02/13/2024   CALCIUM 9.3 02/13/2024   ANIONGAP 8 02/13/2021   EGFR 81 03/14/2023   GFR 79.50 02/13/2024   Lab Results  Component Value Date   CHOL 170 09/19/2023   Lab Results  Component Value Date   HDL 52.10 09/19/2023   Lab Results  Component Value Date   LDLCALC 103 (H) 09/19/2023   Lab Results  Component Value Date   TRIG 74.0 09/19/2023   Lab Results  Component Value Date   CHOLHDL 3 09/19/2023   Lab Results  Component Value Date   HGBA1C 5.9 09/19/2023       Assessment & Plan:  Primary hypertension Assessment & Plan: Well controlled, no changes to meds. Encouraged heart healthy diet such as the DASH diet and exercise as tolerated.   Orders: -     Metoprolol Succinate ER; Take 1 tablet (50 mg total) by mouth daily. Take with or immediately following a meal.  Dispense: 90 tablet; Refill: 1 -     Comprehensive metabolic panel with GFR -     CBC with Differential/Platelet -     TSH  Hyperglycemia Assessment & Plan: hgba1c acceptable, minimize simple carbs. Increase exercise as tolerated.  Orders: -     Hemoglobin A1c  Hyperlipidemia, mixed Assessment & Plan: Encourage heart healthy diet such as MIND or DASH diet, increase  exercise, avoid trans fats, simple carbohydrates and processed foods, consider a krill or fish or flaxseed oil cap daily.   Orders: -     Lipid panel  Morbid obesity (HCC) Assessment & Plan: Encouraged DASH or MIND diet, decrease po intake and increase exercise as tolerated. Needs 7-8 hours of sleep nightly. Avoid trans fats, eat small, frequent meals every 4-5 hours with lean proteins, complex carbs and healthy fats. Minimize simple carbs, high fat  foods and processed foods   Vitamin D deficiency Assessment & Plan: Supplement and monitor  Orders: -     VITAMIN D 25 Hydroxy (Vit-D Deficiency, Fractures)  Obstructive sleep apnea syndrome Assessment & Plan: Has not had a sleep study in over 10 years. Uses her CPAP nightly will refer back to pulmonology for repeat study. She is not sleeping well and wakes up multiple times a night, has trouble falling back to sleep  Orders: -     Pulmonary Visit    Assessment and Plan Assessment & Plan Primary hypertension Blood pressure consistently elevated at home, averaging 140 mmHg. Current regimen includes losartan 50 mg and metoprolol 25 mg. Sustained hypertension poses long-term health risks, necessitating increased metoprolol to prevent complications. - Increase metoprolol to 50 mg daily. - Educated on lifestyle modifications: reduce salt and caffeine intake, increase exercise, manage stress, ensure adequate rest. - Prescribe a 90-day supply of metoprolol from [facility].  Sleep apnea Despite nightly CPAP use, she experiences poor sleep quality, frequent awakenings, and difficulty returning to sleep. A repeat sleep study is warranted. - Refer to pulmonology for repeat sleep study.  Obesity Weight management challenges due to stress, time constraints, and financial issues. Discussed impact of caregiving for elderly parents and explored feasible exercise options like chair exercises and craving management strategies. - Encourage chair  exercises and manageable physical activities. - Discuss strategies for managing cravings and incorporating short exercise bursts into daily routine.  Hyperglycemia Monitoring for diabetes development given family history and current stressors. Medication options will be considered if hyperglycemia is confirmed. - Order blood work to check glucose levels and cholesterol. - Discuss potential medication if hyperglycemia is confirmed.  Follow-up Plan to monitor blood pressure and overall health with regular follow-ups. Adjustments in clinical schedule require future appointment planning. - Schedule follow-up in four months to assess blood pressure control. - Schedule eight to ten month follow-up for physical examination. - Coordinate with nurse practitioner for ongoing care and management.     Danise Edge, MD

## 2024-03-19 ENCOUNTER — Ambulatory Visit: Payer: BC Managed Care – PPO | Admitting: Family Medicine

## 2024-04-23 ENCOUNTER — Encounter: Payer: Self-pay | Admitting: Family Medicine

## 2024-04-24 ENCOUNTER — Other Ambulatory Visit: Payer: Self-pay | Admitting: Family Medicine

## 2024-04-24 ENCOUNTER — Other Ambulatory Visit: Payer: Self-pay | Admitting: Emergency Medicine

## 2024-04-24 DIAGNOSIS — R600 Localized edema: Secondary | ICD-10-CM

## 2024-04-24 DIAGNOSIS — G8929 Other chronic pain: Secondary | ICD-10-CM

## 2024-04-24 MED ORDER — FUROSEMIDE 20 MG PO TABS: 20.0000 mg | ORAL_TABLET | Freq: Every day | ORAL | 1 refills | Status: DC | PRN

## 2024-04-24 MED ORDER — HYDROCODONE-ACETAMINOPHEN 5-325 MG PO TABS: 1.0000 | ORAL_TABLET | Freq: Three times a day (TID) | ORAL | 0 refills | Status: AC | PRN

## 2024-05-05 ENCOUNTER — Other Ambulatory Visit: Payer: Self-pay | Admitting: Family Medicine

## 2024-05-13 ENCOUNTER — Encounter: Payer: Self-pay | Admitting: Adult Health

## 2024-05-13 ENCOUNTER — Ambulatory Visit: Admitting: Adult Health

## 2024-05-13 VITALS — BP 143/99 | HR 89 | Ht 67.0 in | Wt >= 6400 oz

## 2024-05-13 DIAGNOSIS — N3281 Overactive bladder: Secondary | ICD-10-CM | POA: Diagnosis not present

## 2024-05-13 DIAGNOSIS — G4733 Obstructive sleep apnea (adult) (pediatric): Secondary | ICD-10-CM

## 2024-05-13 DIAGNOSIS — G4710 Hypersomnia, unspecified: Secondary | ICD-10-CM | POA: Diagnosis not present

## 2024-05-13 NOTE — Assessment & Plan Note (Signed)
 Severe OSA with excellent control and compliance on CPAP  Patient has perceived benefit on CPAP.  Continue on current settings.  Plan  Patient Instructions  Continue on CPAP At bedtime Keep up good work.  Work on healthy weight loss-discuss with Dr. Rodrick Clapper if you are a candidate for Zepbound.  Do not drive if sleepy Follow up in 1 year and As needed   n

## 2024-05-13 NOTE — Assessment & Plan Note (Signed)
 Persistent daytime hypersomnia and fatigue.-Patient has excellent control and compliance on CPAP.  Suspected sleep is somewhat fragmented with overactive bladder and joint pains.  We discussed healthier sleep regimen, exercise and weight loss.  Hold on stimulant medicines at this time.  Plan  Patient Instructions  Continue on CPAP At bedtime Keep up good work.  Work on healthy weight loss-discuss with Dr. Rodrick Clapper if you are a candidate for Zepbound.  Do not drive if sleepy Follow up in 1 year and As needed

## 2024-05-13 NOTE — Progress Notes (Signed)
 @Patient  ID: Christina Mendez, female    DOB: 01/24/1976, 48 y.o.   MRN: [id number]  Chief Complaint  Patient presents with   Follow-up    Referring provider: Neda Balk, MD  HPI: 48 yo female seen for sleep consult April 2023 to reestablish for severe OSA.  Former patient of Dr. Villa Greaser with a known history of severe sleep apnea with previous sleep study showing AHI at 80/hour and SPO2 low at 77%.    TEST/EVENTS :  May 11, 2014 sleep study showed AHI at 80/hour and SPO2 low at 77%. During the baseline portion,there were 100 obstructive apneas, 0 central apneas, 0 mixed apneas and 64 hypopneas with apnea -hypopnea index of 80 events per hour. There was no relation to sleep stage or body position. Due to this degree of respiratory disturbance CPAP was initiated at 4 cm and titrated to find a level of 13 cm due to persistent events during supine sleep. At the level of 12 cm for 178 minutes of sleep including her 91 minutes of REM sleep, 0 apneas and 0 hypopneas were noted. Titration was optimal . The lowest desaturation was 77% during non-REM sleep   05/13/2024 Follow up : OSA  Patient returns for follow-up visit.  Last seen April 2023.  Patient has severe obstructive sleep apnea is on nocturnal CPAP.  Patient received a new CPAP machine in 2023.  Says she is doing well on CPAP.  Feels that she benefits from CPAP with decreased daytime sleepiness.  Patient says she still continues to feel tired even when she wakes up.  Feels that her sleep is somewhat restless has to get up several times throughout the night to go to the bathroom to urinate.  Has different medications for overactive bladder.  Had no perceived benefit.  Patient says she is working on weight loss but her weight continues to go up.  She is currently at 415 pounds with a BMI of 65.  Patient has hypertension, prediabetes and joint pains with back and knee issues.  Makes it hard for her to exercise.  She is using a full facemask.   At the care is her DME.  CPAP download shows excellent compliance with 100% usage.  Daily average usage at 9 hours.  AHI 0.3/hour. She says she is able to work and do activities without falling asleep.  Just feels tired.   Allergies  Allergen Reactions   Skelaxin Hypertension    Immunization History  Administered Date(s) Administered   DTaP 03/03/1975, 04/02/1975, 05/02/1975, 01/01/1979, 02/13/1981   Hepatitis B, ADULT 11/30/2015, 01/01/2016   IPV 03/03/1975, 04/02/1975, 05/02/1975, 01/01/1979, 02/13/1981   Influenza Split 12/09/2011, 08/17/2012   Influenza, Seasonal, Injecte, Preservative Fre 09/05/2015, 09/19/2023   Influenza,inj,Quad PF,6+ Mos 08/21/2014, 08/13/2019, 08/31/2020   Influenza-Unspecified 09/11/2013, 09/08/2021   MMR 03/26/1976, 12/23/2010   PFIZER(Purple Top)SARS-COV-2 Vaccination 02/21/2020, 03/18/2020, 12/19/2020   PPD Test 12/21/2011, 03/31/2014, 11/27/2015   Pfizer Covid-19 Vaccine Bivalent Booster 73yrs & up 09/08/2021   Pfizer(Comirnaty)Fall Seasonal Vaccine 12 years and older 09/19/2023   Td 02/20/2003, 03/31/2014   Tdap 02/20/2003    Past Medical History:  Diagnosis Date   Acid reflux    Allergic state 03/02/2012   Allergy last couple of years   seasonal   Anxiety    Back pain    Cervical cancer screening 03/02/2012   Chest pain    Chicken pox 3rd grade   GERD (gastroesophageal reflux disease)    GERD (gastroesophageal reflux disease)  Heartburn    HTN (hypertension) 12/09/2011   Hyperlipidemia    Hypertension    Inflamed skin tag 03/02/2012   Menorrhagia 05/04/2015   OAB (overactive bladder)    Obese 12/09/2011   Obesity    OSA (obstructive sleep apnea)    cpap   Ovarian cyst 08/17/2012   Palpitations    Pedal edema 07/11/2017   Sinusitis 01/08/2012   Swollen ankles    Swollen feet    Vaginal Pap smear, abnormal     Tobacco History: Social History   Tobacco Use  Smoking Status Never  Smokeless Tobacco Never   Counseling  given: Not Answered   Outpatient Medications Prior to Visit  Medication Sig Dispense Refill   aspirin EC 81 MG tablet Take 81 mg by mouth daily. Swallow whole.     cholecalciferol (VITAMIN D ) 25 MCG (1000 UNIT) tablet Take 1,000 Units by mouth daily.     [employer] Omega-3 Krill Oil 500 MG CAPS Take 500 mg by mouth daily.     cyclobenzaprine  (FLEXERIL ) 10 MG tablet TAKE ONE TABLET BY MOUTH THREE TIMES DAILY AS NEEDED FOR MUSCLE SPASM 90 tablet 0   fluticasone  (FLONASE ) 50 MCG/ACT nasal spray Place 1 spray into both nostrils daily as needed for allergies.     furosemide  (LASIX ) 20 MG tablet Take 1-2 tablets (20-40 mg total) by mouth daily as needed for edema. *Need office visit* 60 tablet 1   HYDROcodone -acetaminophen  (NORCO/VICODIN) 5-325 MG tablet Take 1 tablet by mouth 3 (three) times daily as needed for moderate pain (pain score 4-6). 30 tablet 0   losartan  (COZAAR ) 50 MG tablet Take 1 tablet (50 mg total) by mouth daily. 30 tablet 3   megestrol  (MEGACE ) 40 MG tablet Take 1 tablet (40 mg total) by mouth daily. Can increase to two tablets twice a day in the event of heavy bleeding 90 tablet 4   metoprolol  succinate (TOPROL -XL) 50 MG 24 hr tablet Take 1 tablet (50 mg total) by mouth daily. Take with or immediately following a meal. 90 tablet 1   Multiple Vitamins-Minerals (WOMENS MULTI VITAMIN & MINERAL PO) Take 1 tablet by mouth daily.     omeprazole  (PRILOSEC) 40 MG capsule TAKE ONE CAPSULE BY MOUTH ONCE A DAY 90 capsule 0   gabapentin  (NEURONTIN ) 100 MG capsule 1 cap po bid and then 3 caps po qhs (Patient not taking: Reported on 05/13/2024) 150 capsule 2   No facility-administered medications prior to visit.     Review of Systems:   Constitutional:   No  weight loss, night sweats,  Fevers, chills,+ fatigue, or  lassitude.  HEENT:   No headaches,  Difficulty swallowing,  Tooth/dental problems, or  Sore throat,                No sneezing, itching, ear ache, nasal congestion, post nasal drip,    CV:  No chest pain,  Orthopnea, PND, anasarca, dizziness, palpitations, syncope.   GI  No heartburn, indigestion, abdominal pain, nausea, vomiting, diarrhea, change in bowel habits, loss of appetite, bloody stools.   Resp: No shortness of breath with exertion or at rest.  No excess mucus, no productive cough,  No non-productive cough,  No coughing up of blood.  No change in color of mucus.  No wheezing.  No chest wall deformity  Skin: no rash or lesions.  GU: no dysuria, change in color of urine, no urgency or frequency.  No flank pain, no hematuria   MS:  No joint pain  or swelling.  No decreased range of motion.  No back pain.    Physical Exam  BP (!) 143/99 (BP Location: Left Wrist, Patient Position: Sitting, Cuff Size: Normal)   Pulse 89   Ht 5\' 7"  (1.702 m)   Wt (!) 415 lb 6.4 oz (188.4 kg)   SpO2 98%   BMI 65.06 kg/m   GEN: A/Ox3; pleasant , NAD, well nourished    HEENT:  [place]/AT,  , NOSE-clear, THROAT-clear, no lesions, no postnasal drip or exudate noted.   NECK:  Supple w/ fair ROM; no JVD; normal carotid impulses w/o bruits; no thyromegaly or nodules palpated; no lymphadenopathy.    RESP  Clear  P & A; w/o, wheezes/ rales/ or rhonchi. no accessory muscle use, no dullness to percussion  CARD:  RRR, no m/r/g, 1+peripheral edema, pulses intact, no cyanosis or clubbing.  GI:   Soft & nt; nml bowel sounds; no organomegaly or masses detected.   Musco: Warm bil, no deformities or joint swelling noted.   Neuro: alert, no focal deficits noted.    Skin: Warm, no lesions or rashes    Lab Results:  CBC    Component Value Date/Time   WBC 5.6 03/18/2024 0858   RBC 4.68 03/18/2024 0858   HGB 14.5 03/18/2024 0858   HCT 42.6 03/18/2024 0858   PLT 282.0 03/18/2024 0858   MCV 91.1 03/18/2024 0858   MCH 30.0 02/13/2021 0501   MCHC 34.0 03/18/2024 0858   RDW 13.8 03/18/2024 0858   LYMPHSABS 1.7 03/18/2024 0858   MONOABS 0.4 03/18/2024 0858   EOSABS 0.1 03/18/2024 0858    BASOSABS 0.0 03/18/2024 0858    BMET    Component Value Date/Time   NA 141 03/18/2024 0858   NA 142 02/02/2021 0928   K 4.3 03/18/2024 0858   CL 104 03/18/2024 0858   CO2 27 03/18/2024 0858   GLUCOSE 109 (H) 03/18/2024 0858   BUN 7 03/18/2024 0858   BUN 9 02/02/2021 0928   CREATININE 0.84 03/18/2024 0858   CREATININE 0.88 03/14/2023 0935   CALCIUM 9.3 03/18/2024 0858   GFRNONAA >60 02/13/2021 0501   GFRAA 114 02/02/2021 0928    BNP No results found for: "BNP"  ProBNP    Component Value Date/Time   PROBNP 17.0 03/14/2023 0935    Imaging: No results found.  Administration History     None           No data to display          No results found for: "NITRICOXIDE"      Assessment & Plan:   Sleep apnea Severe OSA with excellent control and compliance on CPAP  Patient has perceived benefit on CPAP.  Continue on current settings.  Plan  Patient Instructions  Continue on CPAP At bedtime Keep up good work.  Work on healthy weight loss-discuss with Dr. Rodrick Clapper if you are a candidate for Zepbound.  Do not drive if sleepy Follow up in 1 year and As needed   n    Morbid obesity (HCC) Encouraged on healthy weight loss.  Discussed diet and exercise.  Patient does have severe sleep apnea with several comorbidities including hypertension and prediabetes.  Discussed with her that Zepbound has been approved for sleep apnea.  Encouraged her to talk to her primary care provider.  Also could consider referral to healthy weight and wellness.  Plan  Patient Instructions  Continue on CPAP At bedtime Keep up good work.  Work on [employer]  loss-discuss with Dr. Rodrick Clapper if you are a candidate for Zepbound.  Do not drive if sleepy Follow up in 1 year and As needed      OAB (overactive bladder) Continue to follow-up with urology regarding ongoing symptoms as this seems to be disrupting her sleep.  Hypersomnia Persistent daytime hypersomnia and fatigue.-Patient  has excellent control and compliance on CPAP.  Suspected sleep is somewhat fragmented with overactive bladder and joint pains.  We discussed healthier sleep regimen, exercise and weight loss.  Hold on stimulant medicines at this time.  Plan  Patient Instructions  Continue on CPAP At bedtime Keep up good work.  Work on healthy weight loss-discuss with Dr. Rodrick Clapper if you are a candidate for Zepbound.  Do not drive if sleepy Follow up in 1 year and As needed          Roena Clark, NP 05/13/2024

## 2024-05-13 NOTE — Patient Instructions (Signed)
 Continue on CPAP At bedtime Keep up good work.  Work on healthy weight loss-discuss with Dr. Rodrick Clapper if you are a candidate for Zepbound.  Do not drive if sleepy Follow up in 1 year and As needed

## 2024-05-13 NOTE — Assessment & Plan Note (Signed)
 Encouraged on healthy weight loss.  Discussed diet and exercise.  Patient does have severe sleep apnea with several comorbidities including hypertension and prediabetes.  Discussed with her that Zepbound has been approved for sleep apnea.  Encouraged her to talk to her primary care provider.  Also could consider referral to healthy weight and wellness.  Plan  Patient Instructions  Continue on CPAP At bedtime Keep up good work.  Work on healthy weight loss-discuss with Dr. Rodrick Clapper if you are a candidate for Zepbound.  Do not drive if sleepy Follow up in 1 year and As needed

## 2024-05-13 NOTE — Assessment & Plan Note (Signed)
 Continue to follow-up with urology regarding ongoing symptoms as this seems to be disrupting her sleep.

## 2024-05-17 ENCOUNTER — Other Ambulatory Visit: Payer: Self-pay | Admitting: Family Medicine

## 2024-05-22 ENCOUNTER — Encounter: Payer: Self-pay | Admitting: Family Medicine

## 2024-05-23 ENCOUNTER — Other Ambulatory Visit: Payer: Self-pay | Admitting: Family Medicine

## 2024-05-23 DIAGNOSIS — R739 Hyperglycemia, unspecified: Secondary | ICD-10-CM

## 2024-05-23 DIAGNOSIS — I10 Essential (primary) hypertension: Secondary | ICD-10-CM

## 2024-05-23 DIAGNOSIS — G4733 Obstructive sleep apnea (adult) (pediatric): Secondary | ICD-10-CM

## 2024-05-23 DIAGNOSIS — G4710 Hypersomnia, unspecified: Secondary | ICD-10-CM

## 2024-05-23 MED ORDER — WEGOVY 0.25 MG/0.5ML ~~LOC~~ SOAJ: 0.2500 mg | SUBCUTANEOUS | 1 refills | Status: DC

## 2024-05-26 ENCOUNTER — Other Ambulatory Visit: Payer: Self-pay | Admitting: Family Medicine

## 2024-05-26 DIAGNOSIS — R739 Hyperglycemia, unspecified: Secondary | ICD-10-CM

## 2024-05-26 DIAGNOSIS — I10 Essential (primary) hypertension: Secondary | ICD-10-CM

## 2024-06-08 ENCOUNTER — Other Ambulatory Visit: Payer: Self-pay | Admitting: Family Medicine

## 2024-06-11 ENCOUNTER — Telehealth: Payer: Self-pay

## 2024-06-11 ENCOUNTER — Encounter: Payer: Self-pay | Admitting: Family Medicine

## 2024-06-11 NOTE — Progress Notes (Signed)
 Care Guide Pharmacy Note  06/11/2024 Name: [name] MRN: [id number] DOB: 1976/09/19  Referred By: Domenica Harlene LABOR, MD Reason for referral: Complex Care Management (Outreach to schedule with Pharm d )   Christina Mendez is a 48 y.o. year old female who is a primary care patient of Domenica Harlene LABOR, MD.  Delon LITTIE Herring was referred to the pharmacist for assistance related to: HTN  An unsuccessful telephone outreach was attempted today to contact the patient who was referred to the pharmacy team for assistance with medication assistance. Additional attempts will be made to contact the patient.  Jeoffrey Buffalo , RMA     [facility]  [facility] Guide  Direct Dial: [phone]  Website: [email or web address]

## 2024-06-12 ENCOUNTER — Other Ambulatory Visit: Payer: Self-pay | Admitting: Family Medicine

## 2024-06-12 ENCOUNTER — Ambulatory Visit: Admitting: Student

## 2024-06-12 DIAGNOSIS — R600 Localized edema: Secondary | ICD-10-CM

## 2024-06-13 ENCOUNTER — Ambulatory Visit (INDEPENDENT_AMBULATORY_CARE_PROVIDER_SITE_OTHER): Admitting: Student

## 2024-06-13 ENCOUNTER — Ambulatory Visit (HOSPITAL_BASED_OUTPATIENT_CLINIC_OR_DEPARTMENT_OTHER)
Admission: RE | Admit: 2024-06-13 | Discharge: 2024-06-13 | Disposition: A | Discharge status: Home / Self Care | Source: Ambulatory Visit | Attending: Student | Admitting: Student

## 2024-06-13 ENCOUNTER — Encounter: Payer: Self-pay | Admitting: Student

## 2024-06-13 VITALS — BP 137/96 | HR 88 | Temp 98.9°F | Resp 12 | Ht 67.0 in | Wt >= 6400 oz

## 2024-06-13 DIAGNOSIS — G8929 Other chronic pain: Secondary | ICD-10-CM | POA: Insufficient documentation

## 2024-06-13 DIAGNOSIS — M25562 Pain in left knee: Secondary | ICD-10-CM | POA: Diagnosis not present

## 2024-06-13 DIAGNOSIS — I10 Essential (primary) hypertension: Secondary | ICD-10-CM | POA: Diagnosis not present

## 2024-06-13 DIAGNOSIS — R002 Palpitations: Secondary | ICD-10-CM | POA: Diagnosis not present

## 2024-06-13 DIAGNOSIS — M25561 Pain in right knee: Secondary | ICD-10-CM

## 2024-06-13 DIAGNOSIS — M1711 Unilateral primary osteoarthritis, right knee: Secondary | ICD-10-CM | POA: Diagnosis not present

## 2024-06-13 DIAGNOSIS — M1712 Unilateral primary osteoarthritis, left knee: Secondary | ICD-10-CM | POA: Diagnosis not present

## 2024-06-13 MED ORDER — LOSARTAN POTASSIUM 100 MG PO TABS: 100.0000 mg | ORAL_TABLET | Freq: Every day | ORAL | 0 refills | Status: DC

## 2024-06-13 NOTE — Progress Notes (Signed)
 Subjective:     Patient ID: Christina Mendez, female    DOB: Jan 26, 1976, 48 y.o.   MRN: [id number]  Chief Complaint  Patient presents with   Palpitations   arthritis in both knees    Hydrocodone  did not work     HPI  Christina Mendez presents for acute visit. Patient has history of GERD, palpitations, anxiety, obesity, HTN. Presents with complaints of heart palpitations.  States that they occurred for 2 hours last night. Describes palpitations, describes pressure in middle chest heart beating fast, felt jittery . Now resolved, does not currently have palpitations during OV. She has experienced similar feeling in the past.   No shortness of breath, dizziness, lightheadedness, syncope. Denies anxiety. No history thyroid  disorder, stimulant use, or recent increase in caffeine or alcohol intake. No recent illness, infection, or dehydration. No known cardiac history, nor thyroid  disorder.  HTN Furosemade (Lasix ) 20 mg daily, Losartan  (Cozaar ) 50 mg, Metoprolol  50 mg daily. Not taking BP at home.   BP Readings from Last 3 Encounters:  06/13/24 (!) 137/96  05/13/24 (!) 143/99  03/18/24 (!) 141/75     Additional C/O C/O bilateral Knee Pain, chronic. Over the past year she reports worsening pain. Using cane to walk. She reports increased severity pain in both knees, and it is negativetly impacting ADLS. Reports she was diagnosed with arthritis few years ago.   History of Present Illness              Health Maintenance Due  Topic Date Due   Hepatitis B Vaccines (3 of 3 - 19+ 3-dose series) 05/30/2016   DTaP/Tdap/Td (9 - Td or Tdap) 03/31/2024    Past Medical History:  Diagnosis Date   Acid reflux    Allergic state 03/02/2012   Allergy last couple of years   seasonal   Anxiety    Back pain    Cervical cancer screening 03/02/2012   Chest pain    Chicken pox 3rd grade   GERD (gastroesophageal reflux disease)    GERD (gastroesophageal reflux disease)     Heartburn    HTN (hypertension) 12/09/2011   Hyperlipidemia    Hypertension    Inflamed skin tag 03/02/2012   Menorrhagia 05/04/2015   OAB (overactive bladder)    Obese 12/09/2011   Obesity    OSA (obstructive sleep apnea)    cpap   Ovarian cyst 08/17/2012   Palpitations    Pedal edema 07/11/2017   Sinusitis 01/08/2012   Swollen ankles    Swollen feet    Vaginal Pap smear, abnormal     Past Surgical History:  Procedure Laterality Date   COLONOSCOPY WITH PROPOFOL  N/A 05/15/2023   Procedure: COLONOSCOPY WITH PROPOFOL ;  Surgeon: Abran Norleen SAILOR, MD;  Location: [facility];  Service: Gastroenterology;  Laterality: N/A;   cyst removed  48 yr old   benign, abdominal   HERNIA REPAIR      Family History  Problem Relation Age of Onset   Fibromyalgia Mother    Diabetes Mother        type 2   Hypertension Mother    Thyroid  disease Mother    Prostate cancer Father 78   Diabetes Father        type 2   Heart disease Father 15       2 MI   Hypertension Father    Heart attack Father 43       X 2   Stroke Father  mini strokes   GER disease Father    Hyperlipidemia Father    Cholelithiasis Father    Venous thrombosis Father        in liver   Colon polyps Father    Sleep apnea Father    Obesity Father    Hypertension Brother    Venous thrombosis Brother        on spine   Stomach cancer Maternal Grandfather    Hypertension Paternal Grandmother    Diabetes Paternal Grandmother        type 2   Heart attack Paternal Grandmother    Heart disease Paternal Grandmother    Heart attack Paternal Grandfather    Heart disease Paternal Grandfather    Hypertension Paternal Grandfather    Prostate cancer Maternal Uncle     Social History   Socioeconomic History   Marital status: Single    Spouse name: Not on file   Number of children: 0   Years of education: Not on file   Highest education level: Bachelor's degree (e.g., BA, AB, BS)  Occupational History   Occupation:  Data processing manager   Occupation: Haematologist  Tobacco Use   Smoking status: Never   Smokeless tobacco: Never  Vaping Use   Vaping status: Never Used  Substance and Sexual Activity   Alcohol use: Not Currently    Comment: OCCASIONALLY   Drug use: No   Sexual activity: Yes    Partners: Male    Birth control/protection: None    Comment: No dietary restrictions, lives with Mom, Dad and brother and niece.  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 Swallow whole.     cholecalciferol (VITAMIN D ) 25 MCG (1000 UNIT) tablet Take 1,000 Units by mouth daily.     [employer]S Omega-3 Krill Oil 500 MG CAPS Take 500 mg by mouth daily.     cyclobenzaprine  (FLEXERIL ) 10 MG tablet TAKE ONE TABLET BY MOUTH THREE TIMES DAILY AS NEEDED FOR MUSCLE SPASM 90 tablet 0   fluticasone  (FLONASE ) 50 MCG/ACT nasal spray Place 1 spray into both nostrils daily as needed for allergies.     furosemide  (LASIX ) 20 MG tablet TAKE ONE OR TWO TABLETS BY MOUTH DAILY AS NEEDED FOR EDEMA *OFFICE VISIT NEEDED* 180 tablet 1   HYDROcodone -acetaminophen  (NORCO/VICODIN) 5-325 MG tablet Take 1 tablet by mouth 3 (three) times daily as needed for moderate pain (pain score 4-6). 30 tablet 0   megestrol  (MEGACE ) 40 MG tablet Take 1 tablet (40 mg total) by mouth daily. Can increase to two tablets twice a day in the event of heavy bleeding 90 tablet 4   metoprolol  succinate (TOPROL -XL) 50 MG 24 hr tablet Take 1 tablet (50 mg total) by mouth daily. Take with or  immediately following a meal. 90 tablet 1   Multiple Vitamins-Minerals (WOMENS MULTI VITAMIN & MINERAL PO) Take 1 tablet by mouth daily.     omeprazole  (PRILOSEC) 40 MG  capsule Take 1 capsule (40 mg total) by mouth daily. 90 capsule 0   Semaglutide -Weight Management (WEGOVY ) 0.25 MG/0.5ML SOAJ Inject 0.25 mg into the skin once a week. 2 mL 1   losartan  (COZAAR ) 50 MG tablet Take 1 tablet (50 mg total) by mouth daily. 90 tablet 0   gabapentin  (NEURONTIN ) 100 MG capsule 1 cap po bid and then 3 caps po qhs (Patient not taking: Reported on 05/13/2024) 150 capsule 2   No facility-administered medications prior to visit.    Allergies  Allergen Reactions   Skelaxin Hypertension    ROS See HPI    Objective:    Physical Exam  General: No acute distress. Awake and conversant. +obese Eyes: Normal conjunctiva, anicteric. Round symmetric pupils.  ENT: Hearing grossly intact. No nasal discharge.  Respiratory: CTAB. Respirations are non-labored. No wheezing.  Skin: Warm. No rashes or ulcers.  Psych: Alert and oriented. Cooperative, Appropriate mood and affect, Normal judgment.  CV: RRR. No murmur. +Bilateral LEE +2 MSK: No clubbing or cyanosis. ambulates with cane Neuro:  CN II-XII grossly normal.    EKG performed to evaluate palpitations. I have personally seen and reviewed the EKG, which is consistent with prior tracings. No acute concerns  EKG interpretation: Rate: NSR Rhythm: NSR No ST/T changes concerning for acute ischemia/infarct  Previous EKG NSR  Christina LITTIE Jolly, NP  06/13/24      BP (!) 137/96 (BP Location: Right Wrist, Patient Position: Sitting, Cuff Size: Normal)   Pulse 88   Temp 98.9 F (37.2 C) (Oral)   Resp 12   Ht 5' 7 (1.702 m)   Wt (!) 410 lb 6.4 oz (186.2 kg)   SpO2 96%   BMI 64.28 kg/m  Wt Readings from Last 3 Encounters:  06/13/24 (!) 410 lb 6.4 oz (186.2 kg)  05/13/24 (!) 415 lb 6.4 oz (188.4 kg)  03/18/24 (!) 414 lb 12.8 oz (188.2 kg)        Assessment & Plan:   Problem List Items Addressed This Visit     Chronic pain of both knees   Xray pending. Refer to Sports Medicine for further evaluation. Use OTC Tylenol , Ibuprofen for pain management prn.       Relevant Orders   Ambulatory referral to Sports Medicine   DG Knee Complete 4 Views Left   DG Knee Complete 4 Views Right   HTN (hypertension)   BP uncontrolled. Increase losartan  from 50 to 100 mg today. RTC 2 weeks recheck BMP and BP. Encourage home BP monitoring, goal >140/90.      Relevant Medications   losartan  (COZAAR ) 100 MG tablet   Morbid obesity (HCC)   Encouraged on healthy weight loss.  Discussed diet and exercise.  Patient does have severe sleep apnea with several comorbidities including hypertension and prediabetes.  Having issues with Zepbound approval with insurance.         Palpitations - Primary   EKG within normal limits. Labs pending. Ensure adequate hydration.  Limit caffeine.  Return to clinic if symptoms persist, or worsen.      Relevant Orders   EKG 12-Lead (Completed)   Brain natriuretic peptide   CBC with Differential/Platelet   Comp Met (CMET)   Hemoglobin A1c   T4, free   TSH   Normal EKG, labs, and possibly Holter -- but ongoing frequent or distressing symptomsSyncope or near-syncope, Chest pain, Dyspnea     I have discontinued Latina L. Oyervides's gabapentin  and losartan . I am also having her start on  losartan . Additionally, I am having her maintain her aspirin EC, cholecalciferol, Multiple Vitamins-Minerals (WOMENS MULTI VITAMIN & MINERAL PO), [employer]S Omega-3 Krill Oil, fluticasone , megestrol , cyclobenzaprine , metoprolol  succinate, HYDROcodone -acetaminophen , Wegovy , omeprazole , and furosemide .  Meds ordered this encounter  Medications   losartan  (COZAAR ) 100 MG tablet    Sig: Take 1 tablet (100 mg total) by mouth daily.    Dispense:  90 tablet    Refill:  0    Supervising Provider:   DOMENICA BLACKBIRD A [[id number]]

## 2024-06-13 NOTE — Assessment & Plan Note (Addendum)
 Xray pending. Refer to Sports Medicine for further evaluation. Use OTC Tylenol , Ibuprofen for pain management prn.

## 2024-06-13 NOTE — Assessment & Plan Note (Signed)
 BP uncontrolled. Increase losartan  from 50 to 100 mg today. RTC 2 weeks recheck BMP and BP. Encourage home BP monitoring, goal >140/90.

## 2024-06-13 NOTE — Assessment & Plan Note (Addendum)
 EKG within normal limits. Labs pending. Ensure adequate hydration.  Limit caffeine.  Return to clinic if symptoms persist, or worsen.

## 2024-06-13 NOTE — Assessment & Plan Note (Signed)
 Encouraged on healthy weight loss.  Discussed diet and exercise.  Patient does have severe sleep apnea with several comorbidities including hypertension and prediabetes.  Having issues with Zepbound approval with insurance.

## 2024-06-18 ENCOUNTER — Other Ambulatory Visit (INDEPENDENT_AMBULATORY_CARE_PROVIDER_SITE_OTHER)

## 2024-06-18 ENCOUNTER — Ambulatory Visit: Payer: Self-pay | Admitting: Student

## 2024-06-18 DIAGNOSIS — R002 Palpitations: Secondary | ICD-10-CM | POA: Diagnosis not present

## 2024-06-18 LAB — CBC WITH DIFFERENTIAL/PLATELET
Basophils Absolute: 0.1 K/uL (ref 0.0–0.1)
Basophils Relative: 1.2 % (ref 0.0–3.0)
Eosinophils Absolute: 0.1 K/uL (ref 0.0–0.7)
Eosinophils Relative: 1.6 % (ref 0.0–5.0)
HCT: 41 % (ref 36.0–46.0)
Hemoglobin: 14 g/dL (ref 12.0–15.0)
Lymphocytes Relative: 32.3 % (ref 12.0–46.0)
Lymphs Abs: 1.6 K/uL (ref 0.7–4.0)
MCHC: 34.1 g/dL (ref 30.0–36.0)
MCV: 88.7 fl (ref 78.0–100.0)
Monocytes Absolute: 0.3 K/uL (ref 0.1–1.0)
Monocytes Relative: 5.5 % (ref 3.0–12.0)
Neutro Abs: 2.9 K/uL (ref 1.4–7.7)
Neutrophils Relative %: 59.4 % (ref 43.0–77.0)
Platelets: 264 K/uL (ref 150.0–400.0)
RBC: 4.62 Mil/uL (ref 3.87–5.11)
RDW: 13.8 % (ref 11.5–15.5)
WBC: 4.9 K/uL (ref 4.0–10.5)

## 2024-06-18 LAB — COMPREHENSIVE METABOLIC PANEL WITH GFR
ALT: 16 U/L (ref 0–35)
AST: 14 U/L (ref 0–37)
Albumin: 4.2 g/dL (ref 3.5–5.2)
Alkaline Phosphatase: 76 U/L (ref 39–117)
BUN: 10 mg/dL (ref 6–23)
CO2: 28 meq/L (ref 19–32)
Calcium: 9 mg/dL (ref 8.4–10.5)
Chloride: 102 meq/L (ref 96–112)
Creatinine, Ser: 0.84 mg/dL (ref 0.40–1.20)
GFR: 81.58 mL/min (ref >60.00)
Glucose, Bld: 104 mg/dL — ABNORMAL HIGH (ref 70–99)
Potassium: 3.8 meq/L (ref 3.5–5.1)
Sodium: 137 meq/L (ref 135–145)
Total Bilirubin: 0.5 mg/dL (ref 0.2–1.2)
Total Protein: 6.9 g/dL (ref 6.0–8.3)

## 2024-06-18 LAB — BRAIN NATRIURETIC PEPTIDE: Pro B Natriuretic peptide (BNP): 15 pg/mL (ref 0.0–100.0)

## 2024-06-18 LAB — HEMOGLOBIN A1C: Hgb A1c MFr Bld: 6.2 % (ref 4.6–6.5)

## 2024-06-18 LAB — TSH: TSH: 0.66 u[IU]/mL (ref 0.35–5.50)

## 2024-06-18 LAB — T4, FREE: Free T4: 1.03 ng/dL (ref 0.60–1.60)

## 2024-06-18 NOTE — Progress Notes (Signed)
 Care Guide Pharmacy Note  06/18/2024 Name: Christina Mendez MRN: [id number] DOB: 1976-06-10  Referred By: Domenica Harlene LABOR, MD Reason for referral: Complex Care Management (Outreach to schedule with Pharm d )   Christina Mendez is a 48 y.o. year old female who is a primary care patient of Domenica Harlene LABOR, MD.  Christina Mendez was referred to the pharmacist for assistance related to: HLD and obesity   Successful contact was made with the patient to discuss pharmacy services including being ready for the pharmacist to call at least 5 minutes before the scheduled appointment time and to have medication bottles and any blood pressure readings ready for review. The patient agreed to meet with the pharmacist via telephone visit on (date/time).06/24/2024  Jeoffrey Buffalo , RMA     [place]  [facility], [facility] Guide  Direct Dial: [phone]  Website: [email or web address]

## 2024-06-20 ENCOUNTER — Ambulatory Visit
Admission: EM | Admit: 2024-06-20 | Discharge: 2024-06-20 | Disposition: A | Discharge status: Home / Self Care | Attending: Physician Assistant | Admitting: Physician Assistant

## 2024-06-20 ENCOUNTER — Encounter: Payer: Self-pay | Admitting: Emergency Medicine

## 2024-06-20 DIAGNOSIS — H02844 Edema of left upper eyelid: Secondary | ICD-10-CM

## 2024-06-20 MED ORDER — ERYTHROMYCIN 5 MG/GM OP OINT: TOPICAL_OINTMENT | OPHTHALMIC | 0 refills | Status: AC

## 2024-06-20 NOTE — ED Triage Notes (Signed)
 Pt noticed tenderness on left upper eye lid yesterday at work. Noticed swelling and redness last night. Put heat on area last night. Today woke up with worsening swelling.

## 2024-06-20 NOTE — ED Provider Notes (Signed)
 [name]    CSN: [id number] Arrival date & time: 06/20/24  9167      History   Chief Complaint Chief Complaint  Patient presents with   Facial Swelling    HPI Christina Mendez is a 48 y.o. female.   Patient here today for evaluation of redness and tenderness with swelling to left upper eyelid that she first noticed yesterday.  She states that she has used heat on the area without resolution.  She notes today swelling seems to be worse.  She denies any pain to the eye itself.  She has not any vision changes.  She denies any fever.  The history is provided by the patient.    Past Medical History:  Diagnosis Date   Acid reflux    Allergic state 03/02/2012   Allergy last couple of years   seasonal   Anxiety    Back pain    Cervical cancer screening 03/02/2012   Chest pain    Chicken pox 3rd grade   GERD (gastroesophageal reflux disease)    GERD (gastroesophageal reflux disease)    Heartburn    HTN (hypertension) 12/09/2011   Hyperlipidemia    Hypertension    Inflamed skin tag 03/02/2012   Menorrhagia 05/04/2015   OAB (overactive bladder)    Obese 12/09/2011   Obesity    OSA (obstructive sleep apnea)    cpap   Ovarian cyst 08/17/2012   Palpitations    Pedal edema 07/11/2017   Sinusitis 01/08/2012   Swollen ankles    Swollen feet    Vaginal Pap smear, abnormal     Patient Active Problem List   Diagnosis Date Noted   Palpitations 06/13/2024   Chronic pain of both knees 06/13/2024   Hypersomnia 05/13/2024   Hyperglycemia 09/17/2023   Right knee pain 01/03/2022   Vitamin D  deficiency 08/31/2020   Prediabetes 06/11/2020   Back pain 04/28/2020   OAB (overactive bladder) 04/28/2020   Skin lesion of scalp 01/11/2018   Pedal edema 07/11/2017   Esophageal reflux 07/28/2015   Generalized abdominal pain 07/28/2015   Menorrhagia 05/04/2015   Allergic rhinitis 09/05/2014   Hyperlipidemia, mixed 08/24/2014   Absence of bladder continence  08/24/2014   Hyponatremia 08/24/2014   Sleep apnea 07/03/2014   Insomnia 04/02/2014   Amenorrhea 02/23/2014   Ovarian cyst 08/17/2012   Cervical cancer screening 03/02/2012   Allergy 03/02/2012   Sinusitis 01/08/2012   Colon cancer screening 12/11/2011   HTN (hypertension) 12/09/2011   Morbid obesity (HCC) 12/09/2011    Past Surgical History:  Procedure Laterality Date   COLONOSCOPY WITH PROPOFOL  N/A 05/15/2023   Procedure: COLONOSCOPY WITH PROPOFOL ;  Surgeon: Abran Norleen SAILOR, MD;  Location: [facility];  Service: Gastroenterology;  Laterality: N/A;   cyst removed  48 yr old   benign, abdominal   HERNIA REPAIR      OB History     Gravida  0   Para  0   Term  0   Preterm  0   AB  0   Living  0      SAB  0   IAB  0   Ectopic  0   Multiple  0   Live Births  0            Home Medications    Prior to Admission medications   Medication Sig Start Date End Date Taking? Authorizing Provider  erythromycin  ophthalmic ointment Place a 1/2 inch ribbon of ointment into the lower  eyelid. 06/20/24  Yes Billy Asberry FALCON, PA-C  aspirin EC 81 MG tablet Take 81 mg by mouth daily. Swallow whole.    [provider]  cholecalciferol (VITAMIN D ) 25 MCG (1000 UNIT) tablet Take 1,000 Units by mouth daily.    [provider]  [employer] Omega-3 Krill Oil 500 MG CAPS Take 500 mg by mouth daily.    [provider]  cyclobenzaprine  (FLEXERIL ) 10 MG tablet TAKE ONE TABLET BY MOUTH THREE TIMES DAILY AS NEEDED FOR MUSCLE SPASM 02/07/24   Domenica Harlene LABOR, MD  fluticasone  (FLONASE ) 50 MCG/ACT nasal spray Place 1 spray into both nostrils daily as needed for allergies.    [provider]  furosemide  (LASIX ) 20 MG tablet TAKE ONE OR TWO TABLETS BY MOUTH DAILY AS NEEDED FOR EDEMA *OFFICE VISIT NEEDED* 06/12/24   Domenica Harlene LABOR, MD  HYDROcodone -acetaminophen  (NORCO/VICODIN) 5-325 MG tablet Take 1 tablet by mouth 3 (three) times daily as needed for moderate pain (pain  score 4-6). 04/24/24   Domenica Harlene LABOR, MD  losartan  (COZAAR ) 100 MG tablet Take 1 tablet (100 mg total) by mouth daily. 06/13/24   Yacopino, Jessica L, NP  megestrol  (MEGACE ) 40 MG tablet Take 1 tablet (40 mg total) by mouth daily. Can increase to two tablets twice a day in the event of heavy bleeding 01/17/24   Corene Coy, MD  metoprolol  succinate (TOPROL -XL) 50 MG 24 hr tablet Take 1 tablet (50 mg total) by mouth daily. Take with or immediately following a meal. 03/18/24   Domenica Harlene LABOR, MD  Multiple Vitamins-Minerals (WOMENS MULTI VITAMIN & MINERAL PO) Take 1 tablet by mouth daily.    [provider]  omeprazole  (PRILOSEC) 40 MG capsule Take 1 capsule (40 mg total) by mouth daily. 06/10/24   Domenica Harlene LABOR, MD  Semaglutide -Weight Management (WEGOVY ) 0.25 MG/0.5ML SOAJ Inject 0.25 mg into the skin once a week. 05/23/24   Domenica Harlene LABOR, MD    Family History Family History  Problem Relation Age of Onset   Fibromyalgia Mother    Diabetes Mother        type 2   Hypertension Mother    Thyroid  disease Mother    Prostate cancer Father 85   Diabetes Father        type 2   Heart disease Father 7       2 MI   Hypertension Father    Heart attack Father 19       X 2   Stroke Father        mini strokes   GER disease Father    Hyperlipidemia Father    Cholelithiasis Father    Venous thrombosis Father        in liver   Colon polyps Father    Sleep apnea Father    Obesity Father    Hypertension Brother    Venous thrombosis Brother        on spine   Stomach cancer Maternal Grandfather    Hypertension Paternal Grandmother    Diabetes Paternal Grandmother        type 2   Heart attack Paternal Grandmother    Heart disease Paternal Grandmother    Heart attack Paternal Grandfather    Heart disease Paternal Grandfather    Hypertension Paternal Grandfather    Prostate cancer Maternal Uncle     Social History Social History   Tobacco Use   Smoking status: Never    Smokeless tobacco: Never  Vaping Use  Vaping status: Never Used  Substance Use Topics   Alcohol use: Not Currently    Comment: OCCASIONALLY   Drug use: No     Allergies   Skelaxin   Review of Systems Review of Systems  Constitutional:  Negative for chills and fever.  Eyes:  Negative for photophobia, pain, discharge, redness and visual disturbance.  Gastrointestinal:  Negative for abdominal pain, nausea and vomiting.  Skin:  Positive for color change.     Physical Exam Triage Vital Signs ED Triage Vitals [06/20/24 0845]  Encounter Vitals Group     BP      Girls Systolic BP Percentile      Girls Diastolic BP Percentile      Boys Systolic BP Percentile      Boys Diastolic BP Percentile      Pulse      Resp      Temp      Temp src      SpO2      Weight      Height      Head Circumference      Peak Flow      Pain Score 0     Pain Loc      Pain Education      Exclude from Growth Chart    No data found.  Updated Vital Signs BP (!) 167/85 (BP Location: Left Arm)   Pulse 84   Temp 98.2 F (36.8 C) (Oral)   Resp 18   SpO2 97%   Visual Acuity Right Eye Distance: 20/13 (with glasses) Left Eye Distance: 20/15 (with glasses) Bilateral Distance: 20/13 (with glasses)  Right Eye Near:   Left Eye Near:    Bilateral Near:     Physical Exam Vitals and nursing note reviewed.  Constitutional:      General: She is not in acute distress.    Appearance: Normal appearance. She is not ill-appearing.  HENT:     Head: Normocephalic and atraumatic.  Eyes:     Extraocular Movements: Extraocular movements intact.     Conjunctiva/sclera: Conjunctivae normal.     Pupils: Pupils are equal, round, and reactive to light.     Comments: Mild swelling and erythema noted to left upper eyelid diffusely  Cardiovascular:     Rate and Rhythm: Normal rate.  Pulmonary:     Effort: Pulmonary effort is normal. No respiratory distress.  Neurological:     Mental Status: She is alert.   Psychiatric:        Mood and Affect: Mood normal.        Behavior: Behavior normal.        Thought Content: Thought content normal.      UC Treatments / Results  Labs (all labs ordered are listed, but only abnormal results are displayed) Labs Reviewed - No data to display  EKG   Radiology No results found.  Procedures Procedures (including critical care time)  Medications Ordered in UC Medications - No data to display  Initial Impression / Assessment and Plan / UC Course  I have reviewed the triage vital signs and the nursing notes.  Pertinent labs & imaging results that were available during my care of the patient were reviewed by me and considered in my medical decision making (see chart for details).    Recommended alternating cold and warm compresses and antibiotic ointment prescribed. Encouraged follow up if no gradual improvement or with any further concerns.   Final Clinical Impressions(s) / UC Diagnoses  Final diagnoses:  Swelling of left upper eyelid   Discharge Instructions   None    ED Prescriptions     Medication Sig Dispense Auth. Provider   erythromycin  ophthalmic ointment Place a 1/2 inch ribbon of ointment into the lower eyelid. 3.5 g Billy Asberry FALCON, PA-C      PDMP not reviewed this encounter.   Billy Asberry FALCON, PA-C 06/20/24 1402

## 2024-06-24 ENCOUNTER — Encounter: Payer: Self-pay | Admitting: Pharmacist

## 2024-06-24 ENCOUNTER — Ambulatory Visit (INDEPENDENT_AMBULATORY_CARE_PROVIDER_SITE_OTHER): Admitting: Pharmacist

## 2024-06-24 DIAGNOSIS — R7303 Prediabetes: Secondary | ICD-10-CM

## 2024-06-24 DIAGNOSIS — G4733 Obstructive sleep apnea (adult) (pediatric): Secondary | ICD-10-CM

## 2024-06-24 DIAGNOSIS — R739 Hyperglycemia, unspecified: Secondary | ICD-10-CM

## 2024-06-24 NOTE — Progress Notes (Signed)
 06/24/2024 Name: Christina Mendez MRN: [id number] DOB: [birth year]  Chief Complaint  Patient presents with   Medication Management    Medication cost    Christina Mendez is a 48 y.o. year old female who presented for a telephone visit.   They were referred to the pharmacist by their PCP for assistance in managing medication access.    Subjective:  Weight loss has been recommended by patietn's PCP and pulmonary office due to severe sleep apnea, prediabetes, obesity, chronic pain and hypertension. Christina Mendez has been prescribed Wegovy  in February 2025 and Zepbound June 2025, however she has not tried either due to the cost.   Medication Access/Adherence  Current Pharmacy:  [id number] - [address]  - [address] Christina Mendez [place] [address] Phone: [phone] Fax: [phone]  [facility] [address] [name] Phone: [phone] Fax: [phone]   Patient reports affordability concerns with their medications: Yes  Patient reports access/transportation concerns to their pharmacy: No  Patient reports adherence concerns with their medications:  No      Severe OSA / pre diabetes / hypertension / Obesity Also noted to have chronic back pain which limits exericse.   Current medications: none  Weight Management treatments previously prescribed: Wegovy  / Zepbound - not started due to cost.  Phentermine would not be a good option due to hypertension.   Our office was able to get prior authorization for both Wegovy  and Zepbound but cost was still > $1000 per month   Patient has a high deductible insurance plan and she has not met deductible leading to high brand medication cost.   Patient reports she is following intermittent fasting plan and has lost about 5lbs over the last month.   Objective:  Lab Results  Component Value Date   HGBA1C 6.2  06/18/2024    Lab Results  Component Value Date   CREATININE 0.84 06/18/2024   BUN 10 06/18/2024   NA 137 06/18/2024   K 3.8 06/18/2024   CL 102 06/18/2024   CO2 28 06/18/2024    Lab Results  Component Value Date   CHOL 155 03/18/2024   HDL 47.70 03/18/2024   LDLCALC 88 03/18/2024   TRIG 93.0 03/18/2024   CHOLHDL 3 03/18/2024    Medications Reviewed Today     Reviewed by Carla Milling, RPH-CPP (Pharmacist) on 06/24/24 at 1322  Med List Status: <None>   Medication Order Taking? Sig Documenting Provider Last Dose Status Informant  aspirin EC 81 MG tablet [id number]  Take 81 mg by mouth daily. Swallow whole. [provider]  Active   cholecalciferol (VITAMIN D ) 25 MCG (1000 UNIT) tablet [id number]  Take 1,000 Units by mouth daily. [provider]  Active   [employer] Omega-3 Krill Oil 500 MG CAPS [id number]  Take 500 mg by mouth daily. [provider]  Active   cyclobenzaprine  (FLEXERIL ) 10 MG tablet [id number]  TAKE ONE TABLET BY MOUTH THREE TIMES DAILY AS NEEDED FOR MUSCLE SPASM Domenica Harlene LABOR, MD  Active   erythromycin  ophthalmic ointment [id number]  Place a 1/2 inch ribbon of ointment into the lower eyelid. Billy Asberry FALCON, PA-C  Active   fluticasone  (FLONASE ) 50 MCG/ACT nasal spray [id number]  Place 1 spray into both nostrils daily as needed for allergies. [provider]  Active   furosemide  (LASIX ) 20 MG tablet [id number]  TAKE ONE OR TWO TABLETS BY MOUTH  DAILY AS NEEDED FOR EDEMA *OFFICE VISIT NEEDEDDEWAINE Horton, Harlene LABOR, MD  Active   HYDROcodone -acetaminophen  (NORCO/VICODIN) 5-325 MG tablet [id number]  Take 1 tablet by mouth 3 (three) times daily as needed for moderate pain (pain score 4-6). Horton Harlene LABOR, MD  Active   losartan  (COZAAR ) 100 MG tablet [id number]  Take 1 tablet (100 mg total) by mouth daily. Yacopino, Jessica L, NP  Active   megestrol  (MEGACE ) 40 MG tablet [id number]  Take 1 tablet (40 mg total) by mouth daily. Can increase to two tablets  twice a day in the event of heavy bleeding Corene Coy, MD  Active   metoprolol  succinate (TOPROL -XL) 50 MG 24 hr tablet [id number]  Take 1 tablet (50 mg total) by mouth daily. Take with or immediately following a meal. Horton Harlene LABOR, MD  Active   Multiple Vitamins-Minerals (WOMENS MULTI VITAMIN & MINERAL PO) [id number]  Take 1 tablet by mouth daily. [provider]  Active   omeprazole  (PRILOSEC) 40 MG capsule [id number]  Take 1 capsule (40 mg total) by mouth daily. Horton Harlene LABOR, MD  Active   Semaglutide -Weight Management (WEGOVY ) 0.25 MG/0.5ML SOAJ [id number]  Inject 0.25 mg into the skin once a week.  Patient not taking: Reported on 06/24/2024   Horton Harlene LABOR, MD  Active               Assessment/Plan:   Medication Access: - Due to high deductible plan, patient's cost for Zepbound and Wegovy  are > $1000. Even with discount card cost would only decreased about $150 / month.  - Discussed [employer] and Nov[employer] Direct to customer plans - cost for lower doses would be about $250 to $350 per month and $500 for higher doses. She can use her Health Savings Account for this but per patient she cannot use her HSA until it has at least $300 in it.  - Will forward her information about Contrave (bupropion + naltrexone) which might be a lower cost option she could try for weight loss / control of cravings.  - Also recommended she consult with her HR representative to see if there are options or incentives she can complete to increase employer contributions to her HSA.  - Discussed that sometimes megace  can increase appetite / cause weight gain. Patient does not feel that weight / appetite has changed since she started megace .    Follow Up Plan: sending information to patient by MyChart. She will reach out with additional questions.   Madelin Ray, PharmD Clinical Pharmacist [facility]

## 2024-07-23 ENCOUNTER — Encounter: Payer: Self-pay | Admitting: Family Medicine

## 2024-07-23 ENCOUNTER — Telehealth: Admitting: Family Medicine

## 2024-07-23 VITALS — BP 138/87 | Ht 67.0 in | Wt >= 6400 oz

## 2024-07-23 DIAGNOSIS — I10 Essential (primary) hypertension: Secondary | ICD-10-CM

## 2024-07-23 DIAGNOSIS — R7303 Prediabetes: Secondary | ICD-10-CM

## 2024-07-23 DIAGNOSIS — E782 Mixed hyperlipidemia: Secondary | ICD-10-CM

## 2024-07-23 NOTE — Assessment & Plan Note (Signed)
 Blood pressure improving with current medication. Last home reading 138/87 mmHg. No symptoms reported. - Order BMP lab test to recheck kidney function since losartan  was adjusted last month. - Instruct to log blood pressure 2-3 times per week. - Advise to notify if blood pressure exceeds 140/90 mmHg. - Keep follow-up appointment in December.

## 2024-07-23 NOTE — Progress Notes (Signed)
 Virtual Video Visit via MyChart Note  I connected with  Christina Mendez on 07/23/24 at  9:20 AM EDT by the video enabled telemedicine application for MyChart, and verified that I am speaking with the correct person using two identifiers.   I introduced myself as a Publishing rights manager with the practice. We discussed the limitations of evaluation and management by telemedicine and the availability of in person appointments. The patient expressed understanding and agreed to proceed.  Participating parties in this visit include: The patient and the nurse practitioner listed.  The patient is: At home I am: In the office - [facility]  Subjective:    CC:  Chief Complaint  Patient presents with   Medical Management of Chronic Issues    HPI: Christina Mendez is a 48 y.o. year old female presenting today via MyChart today for HTN follow-up.   Discussed the use of AI scribe software for clinical note transcription with the patient, who gave verbal consent to proceed.  History of Present Illness Christina Mendez is a 48 year old female with hypertension who presents for follow-up on blood pressure management.  She is currently on losartan  100 mg daily, metoprolol  50 mg daily, and takes Lasix  as needed for swelling. Her blood pressure is starting to look a little bit better. She checks her blood pressure at home approximately once a week, although she has not done so this week.  Patient denies any chest pain, palpitations, dyspnea, wheezing, edema, recurrent headaches, vision changes.         BP Readings from Last 3 Encounters:  07/23/24 138/87  06/20/24 (!) 167/85  06/13/24 (!) 137/96      Past medical history, Surgical history, Family history not pertinant except as noted below, Social history, Allergies, and medications have been entered into the medical record, reviewed, and corrections made.   Review of Systems:  All review of systems  negative except what is listed in the HPI    Objective:    General:  Speaking clearly in complete sentences. Absent shortness of breath noted.   Alert and oriented x3.   Normal judgment.  Absent acute distress.   Impression and Recommendations:    Problem List Items Addressed This Visit       Active Problems   HTN (hypertension) - Primary   Blood pressure improving with current medication. Last home reading 138/87 mmHg. No symptoms reported. - Order BMP lab test to recheck kidney function since losartan  was adjusted last month. - Instruct to log blood pressure 2-3 times per week. - Advise to notify if blood pressure exceeds 140/90 mmHg. - Keep follow-up appointment in December.      Relevant Orders   Basic metabolic panel with GFR         Follow-up if symptoms worsen or fail to improve. Keep December follow-up.   I discussed the assessment and treatment plan with the patient. The patient was provided an opportunity to ask questions and all were answered. The patient agreed with the plan and demonstrated an understanding of the instructions.   The patient was advised to call back or seek an in-person evaluation if the symptoms worsen or if the condition fails to improve as anticipated.   Waddell KATHEE Mon, NP

## 2024-07-31 ENCOUNTER — Ambulatory Visit: Payer: Self-pay | Admitting: Family Medicine

## 2024-07-31 ENCOUNTER — Other Ambulatory Visit (INDEPENDENT_AMBULATORY_CARE_PROVIDER_SITE_OTHER)

## 2024-07-31 DIAGNOSIS — I10 Essential (primary) hypertension: Secondary | ICD-10-CM | POA: Diagnosis not present

## 2024-07-31 LAB — BASIC METABOLIC PANEL WITH GFR
BUN: 13 mg/dL (ref 6–23)
CO2: 29 meq/L (ref 19–32)
Calcium: 9 mg/dL (ref 8.4–10.5)
Chloride: 104 meq/L (ref 96–112)
Creatinine, Ser: 0.76 mg/dL (ref 0.40–1.20)
GFR: 91.91 mL/min (ref >60.00)
Glucose, Bld: 114 mg/dL — ABNORMAL HIGH (ref 70–99)
Potassium: 4 meq/L (ref 3.5–5.1)
Sodium: 141 meq/L (ref 135–145)

## 2024-08-14 ENCOUNTER — Other Ambulatory Visit: Payer: Self-pay | Admitting: Family Medicine

## 2024-08-14 NOTE — Telephone Encounter (Signed)
 Rx for losartan  50mg  denied. Dose changed.

## 2024-09-13 ENCOUNTER — Other Ambulatory Visit: Payer: Self-pay | Admitting: Family Medicine

## 2024-09-13 DIAGNOSIS — I10 Essential (primary) hypertension: Secondary | ICD-10-CM

## 2024-09-21 ENCOUNTER — Other Ambulatory Visit: Payer: Self-pay | Admitting: Family Medicine

## 2024-09-30 ENCOUNTER — Telehealth: Payer: Self-pay | Admitting: Family Medicine

## 2024-09-30 NOTE — Telephone Encounter (Signed)
 Copied from CRM #[id number]. Topic: General - Other >> Sep 30, 2024  9:55 AM Mia F wrote: Reason for CRM: Pt has a form from her employer to be filled out about her knee. Pt did not want an appt at this time but wants to see if the form can be filled out first. Pt says its not an health assessment

## 2024-10-01 ENCOUNTER — Telehealth: Payer: Self-pay | Admitting: Family Medicine

## 2024-10-01 NOTE — Telephone Encounter (Signed)
 Pt is dropping off forms to be completed by pcp. Pt wants to pick up form from office when it is ready. Please call pt when form is ready.

## 2024-10-01 NOTE — Telephone Encounter (Signed)
Forms placed in provider's box for signature.  

## 2024-10-01 NOTE — Telephone Encounter (Signed)
 Pt wanted to know if she can get her handicap placard filled out.  Advised we should be able to and Dr. Domenica will be back on Thursday to complete.

## 2024-10-01 NOTE — Telephone Encounter (Signed)
 Copied from CRM #[id number]. Topic: General - Other >> Oct 01, 2024  9:10 AM Mia F wrote: Reason for CRM: Please fax back the handicap form to [phone] (fax)

## 2024-10-28 ENCOUNTER — Other Ambulatory Visit: Payer: Self-pay | Admitting: Student

## 2024-10-28 DIAGNOSIS — I10 Essential (primary) hypertension: Secondary | ICD-10-CM

## 2024-11-18 ENCOUNTER — Encounter: Admitting: Family Medicine

## 2024-12-15 ENCOUNTER — Other Ambulatory Visit: Payer: Self-pay | Admitting: Family Medicine

## 2024-12-15 DIAGNOSIS — R600 Localized edema: Secondary | ICD-10-CM

## 2025-03-31 ENCOUNTER — Encounter: Admitting: Family Medicine
